# Patient Record
Sex: Male | Born: 1945 | Race: White | Hispanic: No | Marital: Married | Smoking: Former smoker
Health system: Southern US, Community
[De-identification: ages and names within clinical notes are randomized; demographics above are authoritative.]

## PROBLEM LIST (undated history)

## (undated) DIAGNOSIS — G4730 Sleep apnea, unspecified: Secondary | ICD-10-CM

## (undated) DIAGNOSIS — I2510 Atherosclerotic heart disease of native coronary artery without angina pectoris: Secondary | ICD-10-CM

## (undated) DIAGNOSIS — D649 Anemia, unspecified: Secondary | ICD-10-CM

## (undated) DIAGNOSIS — E538 Deficiency of other specified B group vitamins: Secondary | ICD-10-CM

## (undated) DIAGNOSIS — Z5189 Encounter for other specified aftercare: Secondary | ICD-10-CM

## (undated) DIAGNOSIS — K56609 Unspecified intestinal obstruction, unspecified as to partial versus complete obstruction: Secondary | ICD-10-CM

## (undated) DIAGNOSIS — D689 Coagulation defect, unspecified: Secondary | ICD-10-CM

## (undated) DIAGNOSIS — I10 Essential (primary) hypertension: Secondary | ICD-10-CM

## (undated) DIAGNOSIS — M1990 Unspecified osteoarthritis, unspecified site: Secondary | ICD-10-CM

## (undated) DIAGNOSIS — Z9861 Coronary angioplasty status: Principal | ICD-10-CM

## (undated) DIAGNOSIS — K219 Gastro-esophageal reflux disease without esophagitis: Secondary | ICD-10-CM

## (undated) DIAGNOSIS — T7840XA Allergy, unspecified, initial encounter: Secondary | ICD-10-CM

## (undated) DIAGNOSIS — F329 Major depressive disorder, single episode, unspecified: Secondary | ICD-10-CM

## (undated) DIAGNOSIS — F32A Depression, unspecified: Secondary | ICD-10-CM

## (undated) DIAGNOSIS — T07XXXA Unspecified multiple injuries, initial encounter: Secondary | ICD-10-CM

## (undated) DIAGNOSIS — F419 Anxiety disorder, unspecified: Secondary | ICD-10-CM

## (undated) DIAGNOSIS — E1142 Type 2 diabetes mellitus with diabetic polyneuropathy: Secondary | ICD-10-CM

## (undated) HISTORY — DX: Anemia, unspecified: D64.9

## (undated) HISTORY — DX: Allergy, unspecified, initial encounter: T78.40XA

## (undated) HISTORY — DX: Atherosclerotic heart disease of native coronary artery without angina pectoris: I25.10

## (undated) HISTORY — PX: CHOLECYSTECTOMY: SHX55

## (undated) HISTORY — DX: Coagulation defect, unspecified: D68.9

## (undated) HISTORY — PX: INNER EAR SURGERY: SHX679

## (undated) HISTORY — DX: Major depressive disorder, single episode, unspecified: F32.9

## (undated) HISTORY — DX: Encounter for other specified aftercare: Z51.89

## (undated) HISTORY — DX: Unspecified intestinal obstruction, unspecified as to partial versus complete obstruction: K56.609

## (undated) HISTORY — DX: Unspecified multiple injuries, initial encounter: T07.XXXA

## (undated) HISTORY — DX: Essential (primary) hypertension: I10

## (undated) HISTORY — DX: Anxiety disorder, unspecified: F41.9

## (undated) HISTORY — DX: Unspecified osteoarthritis, unspecified site: M19.90

## (undated) HISTORY — DX: Coronary angioplasty status: Z98.61

## (undated) HISTORY — DX: Type 2 diabetes mellitus with diabetic polyneuropathy: E11.42

## (undated) HISTORY — DX: Gastro-esophageal reflux disease without esophagitis: K21.9

## (undated) HISTORY — PX: APPENDECTOMY: SHX54

## (undated) HISTORY — DX: Deficiency of other specified B group vitamins: E53.8

## (undated) HISTORY — DX: Depression, unspecified: F32.A

## (undated) HISTORY — PX: ABDOMINAL MASS RESECTION: SHX1110

---

## 1967-06-22 DIAGNOSIS — Z5309 Procedure and treatment not carried out because of other contraindication: Secondary | ICD-10-CM

## 1967-06-22 HISTORY — DX: Procedure and treatment not carried out because of other contraindication: Z53.09

## 1998-12-26 ENCOUNTER — Ambulatory Visit (HOSPITAL_COMMUNITY): Admission: RE | Admit: 1998-12-26 | Discharge: 1998-12-26 | Payer: Self-pay | Admitting: Gastroenterology

## 2003-05-28 ENCOUNTER — Encounter: Admission: RE | Admit: 2003-05-28 | Discharge: 2003-05-28 | Payer: Self-pay | Admitting: Gastroenterology

## 2006-11-20 DIAGNOSIS — I2510 Atherosclerotic heart disease of native coronary artery without angina pectoris: Secondary | ICD-10-CM

## 2006-11-20 HISTORY — DX: Atherosclerotic heart disease of native coronary artery without angina pectoris: I25.10

## 2007-05-22 DIAGNOSIS — I2510 Atherosclerotic heart disease of native coronary artery without angina pectoris: Secondary | ICD-10-CM | POA: Diagnosis present

## 2007-06-04 ENCOUNTER — Inpatient Hospital Stay (HOSPITAL_COMMUNITY): Admission: EM | Admit: 2007-06-04 | Discharge: 2007-06-06 | Payer: Self-pay | Admitting: Emergency Medicine

## 2007-06-05 HISTORY — PX: PERCUTANEOUS CORONARY STENT INTERVENTION (PCI-S): SHX6016

## 2007-06-05 HISTORY — PX: CARDIAC CATHETERIZATION: SHX172

## 2007-06-29 ENCOUNTER — Encounter (HOSPITAL_COMMUNITY): Admission: RE | Admit: 2007-06-29 | Discharge: 2007-09-27 | Payer: Self-pay | Admitting: Cardiovascular Disease

## 2007-09-13 HISTORY — PX: OTHER SURGICAL HISTORY: SHX169

## 2007-09-28 ENCOUNTER — Encounter (HOSPITAL_COMMUNITY): Admission: RE | Admit: 2007-09-28 | Discharge: 2007-12-27 | Payer: Self-pay | Admitting: Cardiovascular Disease

## 2007-10-19 ENCOUNTER — Ambulatory Visit: Payer: Self-pay | Admitting: Hematology and Oncology

## 2007-12-07 ENCOUNTER — Ambulatory Visit: Payer: Self-pay | Admitting: Hematology and Oncology

## 2007-12-22 ENCOUNTER — Inpatient Hospital Stay (HOSPITAL_COMMUNITY): Admission: EM | Admit: 2007-12-22 | Discharge: 2007-12-23 | Payer: Self-pay | Admitting: Emergency Medicine

## 2007-12-25 HISTORY — PX: NM MYOCAR SINGLE W/SPECT: HXRAD625

## 2008-02-02 ENCOUNTER — Ambulatory Visit: Payer: Self-pay | Admitting: Hematology and Oncology

## 2008-02-06 LAB — BASIC METABOLIC PANEL
BUN: 16 mg/dL (ref 6–23)
Potassium: 4.6 mEq/L (ref 3.5–5.3)
Sodium: 137 mEq/L (ref 135–145)

## 2008-02-06 LAB — CBC WITH DIFFERENTIAL/PLATELET
Eosinophils Absolute: 0.2 10*3/uL (ref 0.0–0.5)
LYMPH%: 22.8 % (ref 14.0–48.0)
MONO#: 0.4 10*3/uL (ref 0.1–0.9)
NEUT#: 3.3 10*3/uL (ref 1.5–6.5)
Platelets: 132 10*3/uL — ABNORMAL LOW (ref 145–400)
RBC: 5.16 10*6/uL (ref 4.20–5.71)
WBC: 5 10*3/uL (ref 4.0–10.0)

## 2008-02-07 LAB — IRON AND TIBC
%SAT: 27 % (ref 20–55)
Iron: 138 ug/dL (ref 42–165)
TIBC: 512 ug/dL — ABNORMAL HIGH (ref 215–435)
UIBC: 374 ug/dL

## 2008-02-07 LAB — FERRITIN: Ferritin: 15 ng/mL — ABNORMAL LOW (ref 22–322)

## 2008-04-24 ENCOUNTER — Ambulatory Visit: Payer: Self-pay | Admitting: Hematology and Oncology

## 2008-04-26 LAB — CBC WITH DIFFERENTIAL/PLATELET
BASO%: 1.2 % (ref 0.0–2.0)
EOS%: 3.6 % (ref 0.0–7.0)
HGB: 13.5 g/dL (ref 13.0–17.1)
MCH: 30.3 pg (ref 28.0–33.4)
MCHC: 34.4 g/dL (ref 32.0–35.9)
MONO%: 7.3 % (ref 0.0–13.0)
RBC: 4.45 10*6/uL (ref 4.20–5.71)
RDW: 14 % (ref 11.2–14.6)
lymph#: 0.7 10*3/uL — ABNORMAL LOW (ref 0.9–3.3)

## 2008-04-26 LAB — COMPREHENSIVE METABOLIC PANEL
ALT: 27 U/L (ref 0–53)
AST: 30 U/L (ref 0–37)
Albumin: 4.4 g/dL (ref 3.5–5.2)
Alkaline Phosphatase: 60 U/L (ref 39–117)
Calcium: 9.2 mg/dL (ref 8.4–10.5)
Chloride: 103 mEq/L (ref 96–112)
Potassium: 4 mEq/L (ref 3.5–5.3)
Sodium: 140 mEq/L (ref 135–145)

## 2008-06-10 ENCOUNTER — Ambulatory Visit: Payer: Self-pay | Admitting: Hematology and Oncology

## 2008-06-10 LAB — IRON AND TIBC
%SAT: 28 % (ref 20–55)
Iron: 101 ug/dL (ref 42–165)
TIBC: 363 ug/dL (ref 215–435)

## 2008-06-10 LAB — FERRITIN: Ferritin: 313 ng/mL (ref 22–322)

## 2008-06-10 LAB — VITAMIN B12: Vitamin B-12: 725 pg/mL (ref 211–911)

## 2008-09-24 ENCOUNTER — Ambulatory Visit: Payer: Self-pay | Admitting: Hematology and Oncology

## 2008-09-26 LAB — FERRITIN: Ferritin: 173 ng/mL (ref 22–322)

## 2008-09-26 LAB — VITAMIN B12: Vitamin B-12: 522 pg/mL (ref 211–911)

## 2008-09-26 LAB — CBC WITH DIFFERENTIAL/PLATELET
Basophils Absolute: 0 10*3/uL (ref 0.0–0.1)
Eosinophils Absolute: 0.2 10*3/uL (ref 0.0–0.5)
HGB: 14.8 g/dL (ref 13.0–17.1)
LYMPH%: 22.1 % (ref 14.0–49.0)
MCH: 31.6 pg (ref 27.2–33.4)
MCV: 90.1 fL (ref 79.3–98.0)
MONO%: 6.9 % (ref 0.0–14.0)
NEUT#: 3.4 10*3/uL (ref 1.5–6.5)
NEUT%: 67.2 % (ref 39.0–75.0)
Platelets: 131 10*3/uL — ABNORMAL LOW (ref 140–400)

## 2008-09-26 LAB — FOLATE: Folate: 17.9 ng/mL

## 2008-10-14 HISTORY — PX: DOPPLER ECHOCARDIOGRAPHY: SHX263

## 2008-12-26 ENCOUNTER — Inpatient Hospital Stay (HOSPITAL_COMMUNITY): Admission: EM | Admit: 2008-12-26 | Discharge: 2008-12-29 | Payer: Self-pay | Admitting: Internal Medicine

## 2009-03-21 HISTORY — PX: NM MYOVIEW LTD: HXRAD82

## 2009-09-03 ENCOUNTER — Ambulatory Visit: Payer: Self-pay | Admitting: Hematology and Oncology

## 2009-09-05 LAB — CBC WITH DIFFERENTIAL/PLATELET
BASO%: 0.4 % (ref 0.0–2.0)
Basophils Absolute: 0 10*3/uL (ref 0.0–0.1)
EOS%: 2.6 % (ref 0.0–7.0)
Eosinophils Absolute: 0.1 10*3/uL (ref 0.0–0.5)
HCT: 42.2 % (ref 38.4–49.9)
HGB: 14.7 g/dL (ref 13.0–17.1)
LYMPH%: 19.8 % (ref 14.0–49.0)
MCH: 31 pg (ref 27.2–33.4)
MCHC: 34.9 g/dL (ref 32.0–36.0)
MCV: 88.8 fL (ref 79.3–98.0)
MONO#: 0.4 10*3/uL (ref 0.1–0.9)
MONO%: 6.7 % (ref 0.0–14.0)
NEUT#: 4 10*3/uL (ref 1.5–6.5)
NEUT%: 70.5 % (ref 39.0–75.0)
Platelets: 141 10*3/uL (ref 140–400)
RBC: 4.75 10*6/uL (ref 4.20–5.82)
RDW: 12.8 % (ref 11.0–14.6)
WBC: 5.6 10*3/uL (ref 4.0–10.3)
lymph#: 1.1 10*3/uL (ref 0.9–3.3)

## 2009-09-05 LAB — COMPREHENSIVE METABOLIC PANEL
ALT: 39 U/L (ref 0–53)
AST: 37 U/L (ref 0–37)
Albumin: 4.5 g/dL (ref 3.5–5.2)
Alkaline Phosphatase: 62 U/L (ref 39–117)
BUN: 24 mg/dL — ABNORMAL HIGH (ref 6–23)
CO2: 28 mEq/L (ref 19–32)
Calcium: 9.3 mg/dL (ref 8.4–10.5)
Chloride: 106 mEq/L (ref 96–112)
Creatinine, Ser: 1.05 mg/dL (ref 0.40–1.50)
Glucose, Bld: 128 mg/dL — ABNORMAL HIGH (ref 70–99)
Potassium: 3.7 mEq/L (ref 3.5–5.3)
Sodium: 140 mEq/L (ref 135–145)
Total Bilirubin: 0.9 mg/dL (ref 0.3–1.2)
Total Protein: 7 g/dL (ref 6.0–8.3)

## 2009-09-05 LAB — LACTATE DEHYDROGENASE: LDH: 81 U/L — ABNORMAL LOW (ref 94–250)

## 2009-09-08 LAB — IRON AND TIBC: UIBC: 296 ug/dL

## 2009-09-08 LAB — VITAMIN B12: Vitamin B-12: 1119 pg/mL — ABNORMAL HIGH (ref 211–911)

## 2009-09-08 LAB — FOLATE RBC: RBC Folate: 831 ng/mL — ABNORMAL HIGH (ref 180–600)

## 2009-11-21 ENCOUNTER — Ambulatory Visit: Payer: Self-pay | Admitting: Hematology and Oncology

## 2009-11-24 LAB — CBC WITH DIFFERENTIAL/PLATELET
BASO%: 0.3 % (ref 0.0–2.0)
Basophils Absolute: 0 10*3/uL (ref 0.0–0.1)
EOS%: 1.9 % (ref 0.0–7.0)
Eosinophils Absolute: 0.1 10*3/uL (ref 0.0–0.5)
HCT: 40.4 % (ref 38.4–49.9)
HGB: 14.2 g/dL (ref 13.0–17.1)
LYMPH%: 22.1 % (ref 14.0–49.0)
MCH: 31.4 pg (ref 27.2–33.4)
MCHC: 35.3 g/dL (ref 32.0–36.0)
MCV: 89 fL (ref 79.3–98.0)
MONO#: 0.3 10*3/uL (ref 0.1–0.9)
MONO%: 5.7 % (ref 0.0–14.0)
NEUT#: 3.6 10*3/uL (ref 1.5–6.5)
NEUT%: 70 % (ref 39.0–75.0)
Platelets: 125 10*3/uL — ABNORMAL LOW (ref 140–400)
RBC: 4.54 10*6/uL (ref 4.20–5.82)
RDW: 13.7 % (ref 11.0–14.6)
WBC: 5.1 10*3/uL (ref 4.0–10.3)
lymph#: 1.1 10*3/uL (ref 0.9–3.3)

## 2009-11-24 LAB — IRON AND TIBC
%SAT: 23 % (ref 20–55)
Iron: 85 ug/dL (ref 42–165)
TIBC: 368 ug/dL (ref 215–435)
UIBC: 283 ug/dL

## 2009-11-24 LAB — VITAMIN B12: Vitamin B-12: 459 pg/mL (ref 211–911)

## 2009-11-24 LAB — COMPREHENSIVE METABOLIC PANEL
ALT: 42 U/L (ref 0–53)
AST: 27 U/L (ref 0–37)
Albumin: 4.8 g/dL (ref 3.5–5.2)
Alkaline Phosphatase: 62 U/L (ref 39–117)
BUN: 14 mg/dL (ref 6–23)
CO2: 25 mEq/L (ref 19–32)
Calcium: 9.3 mg/dL (ref 8.4–10.5)
Chloride: 105 mEq/L (ref 96–112)
Creatinine, Ser: 1.12 mg/dL (ref 0.40–1.50)
Glucose, Bld: 139 mg/dL — ABNORMAL HIGH (ref 70–99)
Potassium: 4 mEq/L (ref 3.5–5.3)
Sodium: 142 mEq/L (ref 135–145)
Total Bilirubin: 0.7 mg/dL (ref 0.3–1.2)
Total Protein: 6.7 g/dL (ref 6.0–8.3)

## 2009-11-24 LAB — FERRITIN: Ferritin: 74 ng/mL (ref 22–322)

## 2009-12-08 ENCOUNTER — Inpatient Hospital Stay (HOSPITAL_COMMUNITY): Admission: EM | Admit: 2009-12-08 | Discharge: 2009-12-08 | Payer: Self-pay | Admitting: Emergency Medicine

## 2009-12-08 HISTORY — PX: CARDIAC CATHETERIZATION: SHX172

## 2010-03-06 ENCOUNTER — Ambulatory Visit: Payer: Self-pay | Admitting: Hematology and Oncology

## 2010-03-06 LAB — CBC WITH DIFFERENTIAL/PLATELET
BASO%: 0.8 % (ref 0.0–2.0)
Basophils Absolute: 0 10*3/uL (ref 0.0–0.1)
EOS%: 3.2 % (ref 0.0–7.0)
Eosinophils Absolute: 0.2 10*3/uL (ref 0.0–0.5)
HCT: 43.8 % (ref 38.4–49.9)
HGB: 14.8 g/dL (ref 13.0–17.1)
LYMPH%: 21.4 % (ref 14.0–49.0)
MCH: 30.4 pg (ref 27.2–33.4)
MCHC: 33.8 g/dL (ref 32.0–36.0)
MCV: 90 fL (ref 79.3–98.0)
MONO#: 0.4 10*3/uL (ref 0.1–0.9)
MONO%: 7.6 % (ref 0.0–14.0)
NEUT#: 3.4 10*3/uL (ref 1.5–6.5)
NEUT%: 67 % (ref 39.0–75.0)
Platelets: 129 10*3/uL — ABNORMAL LOW (ref 140–400)
RBC: 4.87 10*6/uL (ref 4.20–5.82)
RDW: 13.8 % (ref 11.0–14.6)
WBC: 5.1 10*3/uL (ref 4.0–10.3)
lymph#: 1.1 10*3/uL (ref 0.9–3.3)

## 2010-03-06 LAB — IRON AND TIBC
%SAT: 29 % (ref 20–55)
Iron: 122 ug/dL (ref 42–165)
TIBC: 426 ug/dL (ref 215–435)
UIBC: 304 ug/dL

## 2010-03-06 LAB — FERRITIN: Ferritin: 39 ng/mL (ref 22–322)

## 2010-03-06 LAB — COMPREHENSIVE METABOLIC PANEL
ALT: 33 U/L (ref 0–53)
AST: 31 U/L (ref 0–37)
Albumin: 4.6 g/dL (ref 3.5–5.2)
Alkaline Phosphatase: 62 U/L (ref 39–117)
BUN: 13 mg/dL (ref 6–23)
CO2: 28 mEq/L (ref 19–32)
Calcium: 8.9 mg/dL (ref 8.4–10.5)
Chloride: 102 mEq/L (ref 96–112)
Creatinine, Ser: 1.11 mg/dL (ref 0.40–1.50)
Glucose, Bld: 115 mg/dL — ABNORMAL HIGH (ref 70–99)
Potassium: 4.2 mEq/L (ref 3.5–5.3)
Sodium: 141 mEq/L (ref 135–145)
Total Bilirubin: 0.9 mg/dL (ref 0.3–1.2)
Total Protein: 6.7 g/dL (ref 6.0–8.3)

## 2010-03-06 LAB — VITAMIN B12: Vitamin B-12: 669 pg/mL (ref 211–911)

## 2010-04-16 ENCOUNTER — Ambulatory Visit (HOSPITAL_BASED_OUTPATIENT_CLINIC_OR_DEPARTMENT_OTHER): Payer: Federal, State, Local not specified - PPO | Admitting: Hematology and Oncology

## 2010-04-20 LAB — CBC WITH DIFFERENTIAL/PLATELET
BASO%: 0.8 % (ref 0.0–2.0)
Basophils Absolute: 0 10*3/uL (ref 0.0–0.1)
EOS%: 4.8 % (ref 0.0–7.0)
Eosinophils Absolute: 0.2 10*3/uL (ref 0.0–0.5)
HCT: 43.7 % (ref 38.4–49.9)
HGB: 14.8 g/dL (ref 13.0–17.1)
LYMPH%: 17.7 % (ref 14.0–49.0)
MCH: 30.8 pg (ref 27.2–33.4)
MCHC: 33.9 g/dL (ref 32.0–36.0)
MCV: 90.7 fL (ref 79.3–98.0)
MONO#: 0.4 10*3/uL (ref 0.1–0.9)
MONO%: 8.4 % (ref 0.0–14.0)
NEUT#: 3.2 10*3/uL (ref 1.5–6.5)
NEUT%: 68.3 % (ref 39.0–75.0)
Platelets: 148 10*3/uL (ref 140–400)
RBC: 4.81 10*6/uL (ref 4.20–5.82)
RDW: 14.1 % (ref 11.0–14.6)
WBC: 4.8 10*3/uL (ref 4.0–10.3)
lymph#: 0.8 10*3/uL — ABNORMAL LOW (ref 0.9–3.3)

## 2010-04-20 LAB — FERRITIN: Ferritin: 589 ng/mL — ABNORMAL HIGH (ref 22–322)

## 2010-04-20 LAB — BASIC METABOLIC PANEL
BUN: 14 mg/dL (ref 6–23)
CO2: 29 mEq/L (ref 19–32)
Calcium: 9.4 mg/dL (ref 8.4–10.5)
Chloride: 104 mEq/L (ref 96–112)
Creatinine, Ser: 1.03 mg/dL (ref 0.40–1.50)
Glucose, Bld: 141 mg/dL — ABNORMAL HIGH (ref 70–99)
Potassium: 4.3 mEq/L (ref 3.5–5.3)
Sodium: 140 mEq/L (ref 135–145)

## 2010-04-20 LAB — VITAMIN B12: Vitamin B-12: 710 pg/mL (ref 211–911)

## 2010-07-31 ENCOUNTER — Encounter: Payer: Federal, State, Local not specified - PPO | Admitting: Hematology and Oncology

## 2010-07-31 DIAGNOSIS — E538 Deficiency of other specified B group vitamins: Secondary | ICD-10-CM

## 2010-07-31 DIAGNOSIS — D509 Iron deficiency anemia, unspecified: Secondary | ICD-10-CM

## 2010-07-31 LAB — COMPREHENSIVE METABOLIC PANEL
ALT: 37 U/L (ref 0–53)
AST: 30 U/L (ref 0–37)
Albumin: 4.1 g/dL (ref 3.5–5.2)
Alkaline Phosphatase: 52 U/L (ref 39–117)
BUN: 17 mg/dL (ref 6–23)
CO2: 24 mEq/L (ref 19–32)
Calcium: 9 mg/dL (ref 8.4–10.5)
Chloride: 109 mEq/L (ref 96–112)
Creatinine, Ser: 1.4 mg/dL (ref 0.40–1.50)
Glucose, Bld: 144 mg/dL — ABNORMAL HIGH (ref 70–99)
Potassium: 3.8 mEq/L (ref 3.5–5.3)
Sodium: 142 mEq/L (ref 135–145)
Total Bilirubin: 1 mg/dL (ref 0.3–1.2)
Total Protein: 6.4 g/dL (ref 6.0–8.3)

## 2010-07-31 LAB — IRON AND TIBC
%SAT: 33 % (ref 20–55)
Iron: 110 ug/dL (ref 42–165)
TIBC: 335 ug/dL (ref 215–435)
UIBC: 225 ug/dL

## 2010-07-31 LAB — CBC WITH DIFFERENTIAL/PLATELET
BASO%: 0.2 % (ref 0.0–2.0)
Basophils Absolute: 0 10*3/uL (ref 0.0–0.1)
EOS%: 1.5 % (ref 0.0–7.0)
Eosinophils Absolute: 0.1 10*3/uL (ref 0.0–0.5)
HCT: 41.8 % (ref 38.4–49.9)
HGB: 14.3 g/dL (ref 13.0–17.1)
LYMPH%: 16.7 % (ref 14.0–49.0)
MCH: 30.6 pg (ref 27.2–33.4)
MCHC: 34.1 g/dL (ref 32.0–36.0)
MCV: 89.6 fL (ref 79.3–98.0)
MONO#: 0.3 10*3/uL (ref 0.1–0.9)
MONO%: 5.3 % (ref 0.0–14.0)
NEUT#: 3.9 10*3/uL (ref 1.5–6.5)
NEUT%: 76.3 % — ABNORMAL HIGH (ref 39.0–75.0)
Platelets: 130 10*3/uL — ABNORMAL LOW (ref 140–400)
RBC: 4.67 10*6/uL (ref 4.20–5.82)
RDW: 12.8 % (ref 11.0–14.6)
WBC: 5.1 10*3/uL (ref 4.0–10.3)
lymph#: 0.8 10*3/uL — ABNORMAL LOW (ref 0.9–3.3)

## 2010-07-31 LAB — FERRITIN: Ferritin: 258 ng/mL (ref 22–322)

## 2010-07-31 LAB — VITAMIN B12: Vitamin B-12: 823 pg/mL (ref 211–911)

## 2010-08-04 ENCOUNTER — Encounter: Payer: Federal, State, Local not specified - PPO | Admitting: Hematology and Oncology

## 2010-08-05 ENCOUNTER — Encounter (HOSPITAL_BASED_OUTPATIENT_CLINIC_OR_DEPARTMENT_OTHER): Payer: Federal, State, Local not specified - PPO | Admitting: Hematology and Oncology

## 2010-08-05 ENCOUNTER — Other Ambulatory Visit: Payer: Self-pay | Admitting: Hematology and Oncology

## 2010-08-05 DIAGNOSIS — E538 Deficiency of other specified B group vitamins: Secondary | ICD-10-CM

## 2010-08-05 DIAGNOSIS — D509 Iron deficiency anemia, unspecified: Secondary | ICD-10-CM

## 2010-08-05 LAB — LIPID PANEL
Cholesterol: 106 mg/dL (ref 0–200)
HDL: 45 mg/dL (ref >39)
LDL Cholesterol: 46 mg/dL (ref 0–99)
Total CHOL/HDL Ratio: 2.4 Ratio
Triglycerides: 75 mg/dL (ref <150)
VLDL: 15 mg/dL (ref 0–40)

## 2010-08-05 LAB — GLUCOSE, RANDOM: Glucose, Bld: 155 mg/dL — ABNORMAL HIGH (ref 70–99)

## 2010-09-06 LAB — POCT I-STAT, CHEM 8
BUN: 20 mg/dL (ref 6–23)
Calcium, Ion: 1.17 mmol/L (ref 1.12–1.32)
Chloride: 105 mEq/L (ref 96–112)
Glucose, Bld: 150 mg/dL — ABNORMAL HIGH (ref 70–99)
HCT: 35 % — ABNORMAL LOW (ref 39.0–52.0)
TCO2: 25 mmol/L (ref 0–100)

## 2010-09-06 LAB — TROPONIN I: Troponin I: 0.01 ng/mL (ref 0.00–0.06)

## 2010-09-06 LAB — COMPREHENSIVE METABOLIC PANEL
ALT: 48 U/L (ref 0–53)
Alkaline Phosphatase: 71 U/L (ref 39–117)
BUN: 18 mg/dL (ref 6–23)
Chloride: 106 mEq/L (ref 96–112)
Glucose, Bld: 147 mg/dL — ABNORMAL HIGH (ref 70–99)
Potassium: 3.8 mEq/L (ref 3.5–5.1)
Sodium: 137 mEq/L (ref 135–145)
Total Bilirubin: 0.6 mg/dL (ref 0.3–1.2)

## 2010-09-06 LAB — POCT CARDIAC MARKERS: Troponin i, poc: <0.05 ng/mL (ref 0.00–0.09)

## 2010-09-06 LAB — CBC
HCT: 35.9 % — ABNORMAL LOW (ref 39.0–52.0)
Hemoglobin: 12.5 g/dL — ABNORMAL LOW (ref 13.0–17.0)
MCHC: 34.8 g/dL (ref 30.0–36.0)
MCV: 90.8 fL (ref 78.0–100.0)
RBC: 3.95 MIL/uL — ABNORMAL LOW (ref 4.22–5.81)

## 2010-09-06 LAB — DIFFERENTIAL
Basophils Relative: 1 % (ref 0–1)
Eosinophils Absolute: 0.2 10*3/uL (ref 0.0–0.7)
Eosinophils Relative: 3 % (ref 0–5)
Monocytes Absolute: 0.5 10*3/uL (ref 0.1–1.0)
Monocytes Relative: 8 % (ref 3–12)

## 2010-09-06 LAB — HEMOGLOBIN A1C
Hgb A1c MFr Bld: 6.6 % — ABNORMAL HIGH (ref <5.7)
Mean Plasma Glucose: 143 mg/dL — ABNORMAL HIGH (ref <117)

## 2010-09-06 LAB — MRSA PCR SCREENING: MRSA by PCR: NEGATIVE

## 2010-09-27 LAB — CBC
HCT: 33.7 % — ABNORMAL LOW (ref 39.0–52.0)
Hemoglobin: 11.8 g/dL — ABNORMAL LOW (ref 13.0–17.0)
Hemoglobin: 15.8 g/dL (ref 13.0–17.0)
MCHC: 35.2 g/dL (ref 30.0–36.0)
MCV: 89.8 fL (ref 78.0–100.0)
Platelets: 111 10*3/uL — ABNORMAL LOW (ref 150–400)
RBC: 3.75 MIL/uL — ABNORMAL LOW (ref 4.22–5.81)
RBC: 4.74 MIL/uL (ref 4.22–5.81)
RBC: 5.05 MIL/uL (ref 4.22–5.81)
WBC: 3.8 10*3/uL — ABNORMAL LOW (ref 4.0–10.5)
WBC: 7.7 10*3/uL (ref 4.0–10.5)

## 2010-09-27 LAB — COMPREHENSIVE METABOLIC PANEL
ALT: 24 U/L (ref 0–53)
Albumin: 4.2 g/dL (ref 3.5–5.2)
Alkaline Phosphatase: 69 U/L (ref 39–117)
Alkaline Phosphatase: 74 U/L (ref 39–117)
CO2: 25 mEq/L (ref 19–32)
CO2: 26 mEq/L (ref 19–32)
Calcium: 9.2 mg/dL (ref 8.4–10.5)
GFR calc Af Amer: >60 mL/min (ref >60)
GFR calc non Af Amer: >60 mL/min (ref >60)
Glucose, Bld: 145 mg/dL — ABNORMAL HIGH (ref 70–99)
Potassium: 3.9 mEq/L (ref 3.5–5.1)
Sodium: 135 mEq/L (ref 135–145)
Sodium: 137 mEq/L (ref 135–145)
Total Bilirubin: 1.3 mg/dL — ABNORMAL HIGH (ref 0.3–1.2)
Total Bilirubin: 1.8 mg/dL — ABNORMAL HIGH (ref 0.3–1.2)
Total Protein: 6.7 g/dL (ref 6.0–8.3)

## 2010-09-27 LAB — DIFFERENTIAL
Basophils Absolute: 0 10*3/uL (ref 0.0–0.1)
Basophils Relative: 0 % (ref 0–1)
Lymphocytes Relative: 11 % — ABNORMAL LOW (ref 12–46)
Neutro Abs: 6.1 10*3/uL (ref 1.7–7.7)
Neutrophils Relative %: 79 % — ABNORMAL HIGH (ref 43–77)

## 2010-09-27 LAB — LIPID PANEL
Cholesterol: 80 mg/dL (ref 0–200)
HDL: 39 mg/dL — ABNORMAL LOW (ref >39)
Total CHOL/HDL Ratio: 2.1 RATIO

## 2010-09-27 LAB — TYPE AND SCREEN: ABO/RH(D): O NEG

## 2010-09-27 LAB — CARDIAC PANEL(CRET KIN+CKTOT+MB+TROPI)
CK, MB: 10.2 ng/mL — ABNORMAL HIGH (ref 0.3–4.0)
Relative Index: 7.8 — ABNORMAL HIGH (ref 0.0–2.5)
Troponin I: 0.01 ng/mL (ref 0.00–0.06)

## 2010-09-27 LAB — BASIC METABOLIC PANEL
BUN: 7 mg/dL (ref 6–23)
Chloride: 107 mEq/L (ref 96–112)
GFR calc non Af Amer: >60 mL/min (ref >60)
Potassium: 3.5 mEq/L (ref 3.5–5.1)
Sodium: 137 mEq/L (ref 135–145)

## 2010-09-27 LAB — HEMOGLOBIN A1C
Hgb A1c MFr Bld: 6.1 % (ref 4.6–6.1)
Mean Plasma Glucose: 128 mg/dL

## 2010-09-27 LAB — PROTIME-INR: Prothrombin Time: 12.7 seconds (ref 11.6–15.2)

## 2010-09-27 LAB — URINALYSIS, MICROSCOPIC ONLY
Bilirubin Urine: NEGATIVE
Glucose, UA: NEGATIVE mg/dL
Hgb urine dipstick: NEGATIVE
Ketones, ur: NEGATIVE mg/dL
Protein, ur: NEGATIVE mg/dL

## 2010-09-27 LAB — URINE CULTURE
Colony Count: NO GROWTH
Culture: NO GROWTH
Special Requests: NEGATIVE

## 2010-09-27 LAB — CULTURE, BLOOD (ROUTINE X 2): Culture: NO GROWTH

## 2010-09-27 LAB — TSH: TSH: 1.328 u[IU]/mL (ref 0.350–4.500)

## 2010-10-08 ENCOUNTER — Encounter (HOSPITAL_BASED_OUTPATIENT_CLINIC_OR_DEPARTMENT_OTHER)
Admission: RE | Admit: 2010-10-08 | Discharge: 2010-10-08 | Disposition: A | Discharge status: Home / Self Care | Payer: Federal, State, Local not specified - PPO | Source: Ambulatory Visit | Attending: Surgery | Admitting: Surgery

## 2010-10-08 ENCOUNTER — Other Ambulatory Visit: Payer: Self-pay | Admitting: General Surgery

## 2010-10-08 LAB — CBC
HCT: 41.1 % (ref 39.0–52.0)
Hemoglobin: 14.5 g/dL (ref 13.0–17.0)
MCH: 30.5 pg (ref 26.0–34.0)
MCHC: 35.3 g/dL (ref 30.0–36.0)
MCV: 86.5 fL (ref 78.0–100.0)
RDW: 12.5 % (ref 11.5–15.5)

## 2010-10-08 LAB — BASIC METABOLIC PANEL
BUN: 14 mg/dL (ref 6–23)
CO2: 27 mEq/L (ref 19–32)
Calcium: 8.9 mg/dL (ref 8.4–10.5)
Creatinine, Ser: 1.06 mg/dL (ref 0.4–1.5)
GFR calc non Af Amer: >60 mL/min (ref >60)
Glucose, Bld: 178 mg/dL — ABNORMAL HIGH (ref 70–99)
Sodium: 138 mEq/L (ref 135–145)

## 2010-10-09 ENCOUNTER — Other Ambulatory Visit: Payer: Self-pay | Admitting: Surgery

## 2010-10-09 ENCOUNTER — Ambulatory Visit (HOSPITAL_BASED_OUTPATIENT_CLINIC_OR_DEPARTMENT_OTHER)
Admission: RE | Admit: 2010-10-09 | Discharge: 2010-10-09 | Disposition: A | Discharge status: Home / Self Care | Payer: Federal, State, Local not specified - PPO | Source: Ambulatory Visit | Attending: Surgery | Admitting: Surgery

## 2010-10-09 DIAGNOSIS — F329 Major depressive disorder, single episode, unspecified: Secondary | ICD-10-CM | POA: Insufficient documentation

## 2010-10-09 DIAGNOSIS — Z01812 Encounter for preprocedural laboratory examination: Secondary | ICD-10-CM | POA: Insufficient documentation

## 2010-10-09 DIAGNOSIS — D1739 Benign lipomatous neoplasm of skin and subcutaneous tissue of other sites: Secondary | ICD-10-CM | POA: Insufficient documentation

## 2010-10-09 DIAGNOSIS — K219 Gastro-esophageal reflux disease without esophagitis: Secondary | ICD-10-CM | POA: Insufficient documentation

## 2010-10-09 DIAGNOSIS — F3289 Other specified depressive episodes: Secondary | ICD-10-CM | POA: Insufficient documentation

## 2010-10-09 DIAGNOSIS — F411 Generalized anxiety disorder: Secondary | ICD-10-CM | POA: Insufficient documentation

## 2010-10-09 DIAGNOSIS — M129 Arthropathy, unspecified: Secondary | ICD-10-CM | POA: Insufficient documentation

## 2010-10-14 NOTE — Op Note (Signed)
  NAME:  Jacob Dalton, Jacob Dalton NO.:  [id number]  MEDICAL RECORD NO.:  [id number]            PATIENT TYPE:  LOCATION:                                 FACILITY:  PHYSICIAN:  Currie Paris, M.D.   DATE OF BIRTH:  DATE OF PROCEDURE: DATE OF DISCHARGE:                              OPERATIVE REPORT   PREOPERATIVE DIAGNOSIS:  Mass, right upper back, clinically lipoma.  POSTOPERATIVE DIAGNOSIS:  Mass, right upper back, clinically lipoma.  PROCEDURE:  Excision of mass, right upper back.  SURGEON:  Currie Paris, MD  ANESTHESIA:  General.  CLINICAL HISTORY:  This is a 65 year old gentleman with a soft well- circumscribed mass in the upper right back above the scapula. Clinically this appeared to be a lipoma.  He wished to have this excised because it is gradually enlarging and somewhat tender.  It measured approximately 7 cm.  DESCRIPTION OF PROCEDURE:  I saw the patient in the holding area and we confirmed the plans for the procedure as noted above.  I marked the mass on his back.  The patient was taken to the operating room and after satisfactory general endotracheal anesthesia had been obtained, was placed in the prone position in the operating room table with appropriate padding.  The area around the lipoma was prepped and draped and the time-out was done.  I infiltrated some 0.25% plain Marcaine and subcutaneous tissues.  I made a transverse incision directly over the midportion of the mass.  I divided little bit subcutaneous tissue.  I was unable to excise the mass with the cautery.  This came out intact and in toto.  The area was carefully checked for hemostasis and everything appeared to be dry.  I put some additional 0.25% plain Marcaine into the deeper tissues and in an attempt to reduce his postoperative pain.  The wound was irrigated and the last check made for hemostasis and again everything appeared dry.  I tacked the skin flaps down to the  muscle with interrupted 3-0 Vicryl and then closed with skin with 4-0 Monocryl subcuticular and Dermabond.  The patient tolerated the procedure well and there were no complications.  All counts were correct.     Currie Paris, M.D.     CJS/MEDQ  D:  10/09/2010  T:  10/09/2010  Job:  [id number]  cc:   Candyce Churn, M.D.  Electronically Signed by Cyndia Bent M.D. on 10/14/2010 09:30:24 AM
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
## 2010-11-03 NOTE — Discharge Summary (Signed)
NAME:  Jacob Dalton, Jacob Dalton NO.:  [id number]   MEDICAL RECORD NO.:  [id number]          PATIENT TYPE:  INP   LOCATION:  6523                         FACILITY:  [facility]   PHYSICIAN:  Richard A. Alanda Amass, M.D.DATE OF BIRTH:  10-16-1945   DATE OF ADMISSION:  06/03/2007  DATE OF DISCHARGE:  06/06/2007                               DISCHARGE SUMMARY   DISCHARGE DIAGNOSES:  1. Unstable angina, catheterization and subsequent elective Cypher      stenting to the LAD.  2. Preserved LV function.  3. Family history of coronary disease.  4. Mild dyslipidemia.  5. Post-traumatic stress disorder.  6. Gastroesophageal reflux.   HOSPITAL COURSE:  The patient is a 65 year old male who has seen Dr.  Alanda Amass in the past.  He has no prior history of coronary disease.  He  apparently has had negative stress test in the past but has not seen Dr.  Wandra Arthurs for about 5 years.  He presented June 03, 2007, with  midsternal chest pain consistent with unstable angina.  He was admitted  to telemetry and was started on heparin and nitrates, beta blocker,  aspirin, Protonix and a Statin.  He was cathed electively on June 05, 2007 by Dr. Jenne Campus.  This revealed a 20% RCA, normal left main,  normal circumflex, normal OM and a 99% mid-LAD after the takeoff of the  first diagonal.  He underwent LAD, PCI and Cypher stenting with good  results.  We feel he can be discharged June 06, 2007.  He will  follow up with Dr. Alanda Amass in a couple weeks.   DISCHARGE MEDICATIONS:  1. Simvastatin 20 mg a day.  2. Metoprolol 25 mg b.i.d.  3. Coated aspirin 81 mg a day.  4. Plavix 75 mg a day.  5. Lisinopril 2.5 mg a day.  6. Nitroglycerin sublingual p.r.n.  7. Prilosec OTC 20 mg a day.  8. Lexapro 20 mg a day.   LABS:  White count 5.6, hemoglobin 11.2, hematocrit 33.3, platelets 139.  Sodium 139, potassium 3.9, BUN 8, creatinine 1.1.  Liver functions were  normal.  CK-MB and troponins were  negative x3.  Lipids revealed a  cholesterol 140, LDL 79, HDL 40, TSH 2.49.   Chest X-Ray:  No acute disease.  INR is 1.0.  EKG showed sinus rhythm  with nonspecific ST changes with biphasic T-wave in V2, T-wave inversion  in V3.   DISPOSITION:  The patient was discharged stable condition and will  follow-up with Dr. Alanda Amass in a couple weeks in the office.      Abelino Derrick, P.A.      Richard A. Alanda Amass, M.D.  Electronically Signed    LKK/MEDQ  D:  06/06/2007  T:  06/06/2007  Job:  [id number]   cc:   Gerlene Burdock A. Alanda Amass, M.D.  Tasia Catchings, M.D.
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
## 2010-11-26 ENCOUNTER — Encounter (INDEPENDENT_AMBULATORY_CARE_PROVIDER_SITE_OTHER): Payer: Self-pay | Admitting: Surgery

## 2010-12-01 ENCOUNTER — Other Ambulatory Visit: Payer: Self-pay | Admitting: Hematology and Oncology

## 2010-12-01 ENCOUNTER — Encounter (HOSPITAL_BASED_OUTPATIENT_CLINIC_OR_DEPARTMENT_OTHER): Payer: Federal, State, Local not specified - PPO | Admitting: Hematology and Oncology

## 2010-12-01 DIAGNOSIS — D649 Anemia, unspecified: Secondary | ICD-10-CM

## 2010-12-01 DIAGNOSIS — E538 Deficiency of other specified B group vitamins: Secondary | ICD-10-CM

## 2010-12-01 LAB — CBC WITH DIFFERENTIAL/PLATELET
Basophils Absolute: 0 10*3/uL (ref 0.0–0.1)
EOS%: 1.8 % (ref 0.0–7.0)
Eosinophils Absolute: 0.1 10*3/uL (ref 0.0–0.5)
LYMPH%: 16.8 % (ref 14.0–49.0)
MCH: 31 pg (ref 27.2–33.4)
MCV: 89.1 fL (ref 79.3–98.0)
MONO%: 7 % (ref 0.0–14.0)
NEUT#: 4.4 10*3/uL (ref 1.5–6.5)
Platelets: 132 10*3/uL — ABNORMAL LOW (ref 140–400)
RBC: 4.78 10*6/uL (ref 4.20–5.82)
RDW: 13.4 % (ref 11.0–14.6)

## 2010-12-01 LAB — IRON AND TIBC
Iron: 94 ug/dL (ref 42–165)
UIBC: 271 ug/dL

## 2010-12-01 LAB — BASIC METABOLIC PANEL
BUN: 17 mg/dL (ref 6–23)
Calcium: 9.8 mg/dL (ref 8.4–10.5)
Glucose, Bld: 130 mg/dL — ABNORMAL HIGH (ref 70–99)
Sodium: 136 mEq/L (ref 135–145)

## 2010-12-04 ENCOUNTER — Encounter (HOSPITAL_BASED_OUTPATIENT_CLINIC_OR_DEPARTMENT_OTHER): Payer: Federal, State, Local not specified - PPO | Admitting: Hematology and Oncology

## 2010-12-04 DIAGNOSIS — D518 Other vitamin B12 deficiency anemias: Secondary | ICD-10-CM

## 2010-12-04 DIAGNOSIS — D509 Iron deficiency anemia, unspecified: Secondary | ICD-10-CM

## 2011-03-18 LAB — DIFFERENTIAL
Basophils Absolute: 0
Lymphocytes Relative: 23
Monocytes Relative: 8

## 2011-03-18 LAB — BASIC METABOLIC PANEL
BUN: 23
CO2: 23
Chloride: 105
Creatinine, Ser: 1.14
Glucose, Bld: 110 — ABNORMAL HIGH

## 2011-03-18 LAB — CBC
Hemoglobin: 12.1 — ABNORMAL LOW
MCHC: 33.1
MCV: 77 — ABNORMAL LOW
Platelets: 126 — ABNORMAL LOW
Platelets: 143 — ABNORMAL LOW
RDW: 22.5 — ABNORMAL HIGH
RDW: 23.5 — ABNORMAL HIGH
WBC: 4.3
WBC: 4.7

## 2011-03-18 LAB — CK TOTAL AND CKMB (NOT AT ARMC): Total CK: 187

## 2011-03-18 LAB — COMPREHENSIVE METABOLIC PANEL
ALT: 28
AST: 29
CO2: 26
Calcium: 8.4
Chloride: 105
GFR calc Af Amer: >60
GFR calc non Af Amer: >60
Sodium: 139
Total Bilirubin: 0.8

## 2011-03-18 LAB — POCT CARDIAC MARKERS
CKMB, poc: 2.1
Myoglobin, poc: 88.6
Operator id: 288831
Operator id: 288831
Troponin i, poc: <0.05

## 2011-03-18 LAB — LIPID PANEL
LDL Cholesterol: 37
Triglycerides: 95
VLDL: 19

## 2011-03-18 LAB — POCT I-STAT, CHEM 8
BUN: 16
Chloride: 106
Sodium: 138

## 2011-03-18 LAB — APTT: aPTT: 29

## 2011-03-26 LAB — CBC
HCT: 33.3 — ABNORMAL LOW
HCT: 34.7 — ABNORMAL LOW
HCT: 35.3 — ABNORMAL LOW
Hemoglobin: 11.2 — ABNORMAL LOW
Hemoglobin: 11.4 — ABNORMAL LOW
MCHC: 32.7
MCHC: 33.7
MCV: 74.7 — ABNORMAL LOW
MCV: 75.7 — ABNORMAL LOW
MCV: 75.8 — ABNORMAL LOW
Platelets: 139 — ABNORMAL LOW
Platelets: 145 — ABNORMAL LOW
RBC: 4.45
RBC: 4.57
RBC: 4.66
RDW: 17.1 — ABNORMAL HIGH
RDW: 17.3 — ABNORMAL HIGH
WBC: 4.9
WBC: 5.2
WBC: 5.6

## 2011-03-26 LAB — TROPONIN I: Troponin I: 0.04

## 2011-03-26 LAB — BASIC METABOLIC PANEL
BUN: 8
CO2: 29
Chloride: 105
Creatinine, Ser: 1.09
Potassium: 3.9

## 2011-03-26 LAB — CK TOTAL AND CKMB (NOT AT ARMC)
CK, MB: 1.8
Relative Index: INVALID
Total CK: 83

## 2011-03-29 LAB — BASIC METABOLIC PANEL
BUN: 9
Calcium: 8.9
Glucose, Bld: 130 — ABNORMAL HIGH
Potassium: 3.9

## 2011-03-29 LAB — MAGNESIUM: Magnesium: 2.3

## 2011-03-29 LAB — I-STAT 8, (EC8 V) (CONVERTED LAB)
Bicarbonate: 24.9 — ABNORMAL HIGH
Glucose, Bld: 132 — ABNORMAL HIGH
HCT: 38 — ABNORMAL LOW
Hemoglobin: 12.9 — ABNORMAL LOW
Operator id: 294511
Potassium: 4.6
Sodium: 139
TCO2: 26

## 2011-03-29 LAB — CBC
HCT: 35 — ABNORMAL LOW
MCHC: 32.7
MCHC: 33.3
MCV: 74.5 — ABNORMAL LOW
Platelets: 164
RBC: 4.56
RBC: 4.7
WBC: 4.4

## 2011-03-29 LAB — TSH: TSH: 2.498

## 2011-03-29 LAB — LIPID PANEL
Cholesterol: 140
HDL: 40
LDL Cholesterol: 79
Total CHOL/HDL Ratio: 3.5
Triglycerides: 103
VLDL: 21

## 2011-03-29 LAB — CARDIAC PANEL(CRET KIN+CKTOT+MB+TROPI)
CK, MB: 2.9
CK, MB: 3.2
Total CK: 130

## 2011-03-29 LAB — CK TOTAL AND CKMB (NOT AT ARMC)
CK, MB: 3.6
Relative Index: 2.3

## 2011-03-29 LAB — POCT CARDIAC MARKERS
CKMB, poc: 2.4
Myoglobin, poc: 76.3
Operator id: 294511

## 2011-03-29 LAB — COMPREHENSIVE METABOLIC PANEL
AST: 31
BUN: 10
CO2: 25
Calcium: 8.5
Creatinine, Ser: 1.25
GFR calc Af Amer: >60
GFR calc non Af Amer: 58 — ABNORMAL LOW
Total Bilirubin: 0.6

## 2011-03-29 LAB — APTT: aPTT: 27

## 2011-03-29 LAB — PROTIME-INR
INR: 1
Prothrombin Time: 13.4

## 2011-03-29 LAB — POCT I-STAT CREATININE: Operator id: 294511

## 2011-03-29 LAB — HEPARIN LEVEL (UNFRACTIONATED): Heparin Unfractionated: 0.74 — ABNORMAL HIGH

## 2011-04-13 ENCOUNTER — Other Ambulatory Visit: Payer: Self-pay | Admitting: Hematology and Oncology

## 2011-04-13 ENCOUNTER — Encounter (HOSPITAL_BASED_OUTPATIENT_CLINIC_OR_DEPARTMENT_OTHER): Payer: Federal, State, Local not specified - PPO | Admitting: Hematology and Oncology

## 2011-04-13 DIAGNOSIS — E538 Deficiency of other specified B group vitamins: Secondary | ICD-10-CM

## 2011-04-13 DIAGNOSIS — D509 Iron deficiency anemia, unspecified: Secondary | ICD-10-CM

## 2011-04-13 LAB — CBC WITH DIFFERENTIAL/PLATELET
Basophils Absolute: 0 10*3/uL (ref 0.0–0.1)
EOS%: 3.1 % (ref 0.0–7.0)
HGB: 14.8 g/dL (ref 13.0–17.1)
LYMPH%: 22.5 % (ref 14.0–49.0)
MCH: 32 pg (ref 27.2–33.4)
MCV: 93.3 fL (ref 79.3–98.0)
MONO%: 7.5 % (ref 0.0–14.0)
NEUT%: 66.7 % (ref 39.0–75.0)
Platelets: 140 10*3/uL (ref 140–400)
RDW: 13.3 % (ref 11.0–14.6)

## 2011-04-13 LAB — BASIC METABOLIC PANEL
BUN: 12 mg/dL (ref 6–23)
Calcium: 9.5 mg/dL (ref 8.4–10.5)
Creatinine, Ser: 0.96 mg/dL (ref 0.50–1.35)
Potassium: 3.9 mEq/L (ref 3.5–5.3)

## 2011-04-13 LAB — IRON AND TIBC
Iron: 96 ug/dL (ref 42–165)
UIBC: 331 ug/dL (ref 125–400)

## 2011-04-16 ENCOUNTER — Encounter (HOSPITAL_BASED_OUTPATIENT_CLINIC_OR_DEPARTMENT_OTHER): Payer: Federal, State, Local not specified - PPO | Admitting: Hematology and Oncology

## 2011-04-16 DIAGNOSIS — D518 Other vitamin B12 deficiency anemias: Secondary | ICD-10-CM

## 2011-04-16 DIAGNOSIS — D509 Iron deficiency anemia, unspecified: Secondary | ICD-10-CM

## 2011-08-16 ENCOUNTER — Other Ambulatory Visit (HOSPITAL_BASED_OUTPATIENT_CLINIC_OR_DEPARTMENT_OTHER): Payer: Federal, State, Local not specified - PPO | Admitting: Lab

## 2011-08-16 DIAGNOSIS — D518 Other vitamin B12 deficiency anemias: Secondary | ICD-10-CM

## 2011-08-16 DIAGNOSIS — D509 Iron deficiency anemia, unspecified: Secondary | ICD-10-CM

## 2011-08-16 LAB — CBC WITH DIFFERENTIAL/PLATELET
Basophils Absolute: 0 10*3/uL (ref 0.0–0.1)
EOS%: 1.9 % (ref 0.0–7.0)
Eosinophils Absolute: 0.1 10*3/uL (ref 0.0–0.5)
HCT: 43.3 % (ref 38.4–49.9)
HGB: 14.6 g/dL (ref 13.0–17.1)
MCH: 30.5 pg (ref 27.2–33.4)
MCV: 90.4 fL (ref 79.3–98.0)
MONO%: 8.7 % (ref 0.0–14.0)
NEUT#: 3.4 10*3/uL (ref 1.5–6.5)
NEUT%: 66.8 % (ref 39.0–75.0)
Platelets: 145 10*3/uL (ref 140–400)

## 2011-08-16 LAB — IRON AND TIBC
%SAT: 27 % (ref 20–55)
TIBC: 404 ug/dL (ref 215–435)

## 2011-08-20 ENCOUNTER — Telehealth: Payer: Self-pay | Admitting: Hematology and Oncology

## 2011-08-20 ENCOUNTER — Encounter (HOSPITAL_COMMUNITY): Payer: Self-pay

## 2011-08-20 ENCOUNTER — Other Ambulatory Visit: Payer: Self-pay

## 2011-08-20 ENCOUNTER — Emergency Department (HOSPITAL_COMMUNITY): Payer: Federal, State, Local not specified - PPO

## 2011-08-20 ENCOUNTER — Ambulatory Visit (HOSPITAL_BASED_OUTPATIENT_CLINIC_OR_DEPARTMENT_OTHER): Payer: Federal, State, Local not specified - PPO | Admitting: Hematology and Oncology

## 2011-08-20 ENCOUNTER — Emergency Department (HOSPITAL_COMMUNITY)
Admission: EM | Admit: 2011-08-20 | Discharge: 2011-08-20 | Disposition: A | Discharge status: Home / Self Care | Payer: Federal, State, Local not specified - PPO | Attending: Emergency Medicine | Admitting: Emergency Medicine

## 2011-08-20 VITALS — BP 118/78 | HR 69 | Temp 96.8°F | Ht 69.0 in | Wt 159.6 lb

## 2011-08-20 DIAGNOSIS — Z7982 Long term (current) use of aspirin: Secondary | ICD-10-CM | POA: Insufficient documentation

## 2011-08-20 DIAGNOSIS — E538 Deficiency of other specified B group vitamins: Secondary | ICD-10-CM

## 2011-08-20 DIAGNOSIS — I2510 Atherosclerotic heart disease of native coronary artery without angina pectoris: Secondary | ICD-10-CM | POA: Insufficient documentation

## 2011-08-20 DIAGNOSIS — D649 Anemia, unspecified: Secondary | ICD-10-CM

## 2011-08-20 DIAGNOSIS — I10 Essential (primary) hypertension: Secondary | ICD-10-CM | POA: Insufficient documentation

## 2011-08-20 DIAGNOSIS — D539 Nutritional anemia, unspecified: Secondary | ICD-10-CM

## 2011-08-20 DIAGNOSIS — Z9861 Coronary angioplasty status: Secondary | ICD-10-CM | POA: Diagnosis present

## 2011-08-20 DIAGNOSIS — K219 Gastro-esophageal reflux disease without esophagitis: Secondary | ICD-10-CM | POA: Diagnosis present

## 2011-08-20 DIAGNOSIS — M549 Dorsalgia, unspecified: Secondary | ICD-10-CM | POA: Insufficient documentation

## 2011-08-20 DIAGNOSIS — R079 Chest pain, unspecified: Secondary | ICD-10-CM

## 2011-08-20 DIAGNOSIS — E785 Hyperlipidemia, unspecified: Secondary | ICD-10-CM | POA: Diagnosis present

## 2011-08-20 DIAGNOSIS — M79609 Pain in unspecified limb: Secondary | ICD-10-CM | POA: Insufficient documentation

## 2011-08-20 DIAGNOSIS — Z79899 Other long term (current) drug therapy: Secondary | ICD-10-CM | POA: Insufficient documentation

## 2011-08-20 LAB — TROPONIN I: Troponin I: <0.3 ng/mL (ref <0.30)

## 2011-08-20 LAB — POCT I-STAT, CHEM 8
BUN: 12 mg/dL (ref 6–23)
Chloride: 103 mEq/L (ref 96–112)
Creatinine, Ser: 1.1 mg/dL (ref 0.50–1.35)
Glucose, Bld: 126 mg/dL — ABNORMAL HIGH (ref 70–99)
Hemoglobin: 13.6 g/dL (ref 13.0–17.0)
Potassium: 4 mEq/L (ref 3.5–5.1)
Sodium: 139 mEq/L (ref 135–145)

## 2011-08-20 LAB — POCT I-STAT TROPONIN I

## 2011-08-20 NOTE — Progress Notes (Signed)
CC:   Richard A. Alanda Amass, M.D. Tasia Catchings, M.D. James L. Malon Kindle., M.D.  IDENTIFYING STATEMENT:  The patient is a 66 year old man with iron- deficiency anemia and B12 deficiency anemia who presents for followup.  INTERVAL HISTORY:  [name] reports chest pain for a few hours now. The pain is located in the right upper chest.  He feels it is reflux. He has a history of coronary artery disease.  He is on Plavix.  We reviewed his recent anemia workup, which is noted below.  The patient is not short of breath.  He is not lightheaded.  MEDICATIONS:  Reviewed and updated.  PAST MEDICAL HISTORY:  Unchanged.  FAMILY HISTORY:  Unchanged.  SOCIAL HISTORY:  Unchanged.  REVIEW OF SYSTEMS:  Cardiovascular:  Chest pain as above.  He denies PND, orthopnea, or ankle swelling.  Respiration:  He denies cough, hemoptysis, wheeze, or shortness of breath.  GI:  Post reflux.  He denies constipation or diarrhea.  The rest of the review of systems is negative.  PHYSICAL EXAMINATION:  General Appearance:  Alert and oriented x3. Vital Signs:  Pulse 69.  Blood pressure 118/78.  Temperature 96.8. Respirations 20.  Weight 159 pounds.  HEENT:  Sclerae are anicteric. Mouth is moist.  Chest:  Clear.  Abdomen:  Soft.  Bowel sounds present. Extremities:  No calf tenderness.  Pulses present and symmetrical.  LABORATORY DATA:  On 08/16/2011, white cell count 5, hemoglobin 14.6, hematocrit 43.3, platelets 145.  Ferritin 84 (136), B12 656 (719), iron 111, TIBC 404, saturation 27%.  IMPRESSION AND PLAN:  Mr. 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  His ferritin store is less than 100, so he will proceed with intravenous iron in the form of Feraheme.  We will schedule This later on next week.  However, he needs to proceed to the emergency room to be evaluated for the chest pain.  He follows up in 4-6  months' time.    ______________________________ Laurice Record, M.D. LIO/MEDQ  D:  08/20/2011  T:  08/20/2011  Job:  [id number]

## 2011-08-20 NOTE — Telephone Encounter (Signed)
[facility] for pt re appts for 3/8 and 9/18 and 9/20. Schedule mailed.

## 2011-08-20 NOTE — Progress Notes (Signed)
This office note has been dictated.

## 2011-08-20 NOTE — ED Notes (Signed)
Pt here with c/o chest pain radiating to left arm, pt was at [facility] for md visit, and on his way to visit he began experiencing chest pain.

## 2011-08-20 NOTE — Patient Instructions (Signed)
Patient to follow up as instructed. Patient to proceed to emergency room to be evaluated for chest pain.

## 2011-08-20 NOTE — ED Notes (Signed)
Dr. Herbie Baltimore at bedside evaluating patient.
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
## 2011-08-20 NOTE — Discharge Instructions (Signed)
Please follow up with Dr. Alanda Amass as Dr. Herbie Baltimore has instructed. If you are having worsening pain, shortness of breath, nausea/vomiting, or with any other worrisome symptoms, please return to the ER.  Chest Pain (Nonspecific) It is often hard to give a specific diagnosis for the cause of chest pain. There is always a chance that your pain could be related to something serious, such as a heart attack or a blood clot in the lungs. You need to follow up with your caregiver for further evaluation. CAUSES   Heartburn.   Pneumonia or bronchitis.   Anxiety and stress.   Inflammation around your heart (pericarditis) or lung (pleuritis or pleurisy).   A blood clot in the lung.   A collapsed lung (pneumothorax). It can develop suddenly on its own (spontaneous pneumothorax) or from injury (trauma) to the chest.  The chest wall is composed of bones, muscles, and cartilage. Any of these can be the source of the pain.  The bones can be bruised by injury.   The muscles or cartilage can be strained by coughing or overwork.   The cartilage can be affected by inflammation and become sore (costochondritis).  DIAGNOSIS  Lab tests or other studies, such as X-rays, an EKG, stress testing, or cardiac imaging, may be needed to find the cause of your pain.  TREATMENT   Treatment depends on what may be causing your chest pain. Treatment may include:   Acid blockers for heartburn.   Anti-inflammatory medicine.   Pain medicine for inflammatory conditions.   Antibiotics if an infection is present.   You may be advised to change lifestyle habits. This includes stopping smoking and avoiding caffeine and chocolate.   You may be advised to keep your head raised (elevated) when sleeping. This reduces the chance of acid going backward from your stomach into your esophagus.   Most of the time, nonspecific chest pain will improve within 2 to 3 days with rest and mild pain medicine.  HOME CARE INSTRUCTIONS     If antibiotics were prescribed, take the full amount even if you start to feel better.   For the next few days, avoid physical activities that bring on chest pain. Continue physical activities as directed.   Do not smoke cigarettes or drink alcohol until your symptoms are gone.   Only take over-the-counter or prescription medicine for pain, discomfort, or fever as directed by your caregiver.   Follow your caregiver's suggestions for further testing if your chest pain does not go away.   Keep any follow-up appointments you made. If you do not go to an appointment, you could develop lasting (chronic) problems with pain. If there is any problem keeping an appointment, you must call to reschedule.  SEEK MEDICAL CARE IF:   You think you are having problems from the medicine you are taking. Read your medicine instructions carefully.   Your chest pain does not go away, even after treatment.   You develop a rash with blisters on your chest.  SEEK IMMEDIATE MEDICAL CARE IF:   You have increased chest pain or pain that spreads to your arm, neck, jaw, back, or belly (abdomen).   You develop shortness of breath, an increasing cough, or you are coughing up blood.   You have severe back or abdominal pain, feel sick to your stomach (nauseous) or throw up (vomit).   You develop severe weakness, fainting, or chills.   You have an oral temperature above 102 F (38.9 C), not controlled by medicine.  THIS IS AN EMERGENCY. Do not wait to see if the pain will go away. Get medical help at once. Call your local emergency services (911 in [place] Do not drive yourself to the hospital. MAKE SURE YOU:   Understand these instructions.   Will watch your condition.   Will get help right away if you are not doing well or get worse.  Document Released: 03/17/2005 Document Revised: 02/17/2011 Document Reviewed: 01/11/2008 [facility] Patient Information 2012 [place], [place].

## 2011-08-20 NOTE — H&P (Signed)
Patient ID: Jacob Dalton MRN: [id number], DOB/AGE: 1945-07-30   Admit date: 08/20/2011   Primary Physician: Jacob Ghee, MD, MD Primary Cardiologist: Dr Jacob Dalton  HPI: 66 y/o Jacob Dalton who has a history of CAD. Pt had LAD DES 12/08. No other significant CAD. He was restudied in June of 2011 and 40% ISR treated medically.  He has chronic anemia and this am he was seeing his hematologist when he mentioned he had been having some Lt sided chest pain and Lt shoulder pain. He describes this as a deep ache. There is no associated nausea, vomiting , or diaphoresis. He denies any SOB. There is no radiation to his jaw as he had previously with his LAD stent. His POC in ER is negative. His EKG is WNL.   Problem List: Past Medical History  Diagnosis Date  . Heart disease   . CAD (coronary artery disease)   . Intestinal occlusion     TWISTED  . Multiple wounds     [place] Vet  . Hypertension     Past Surgical History  Procedure Date  . Abdominal mass resection   . Inner ear surgery      Allergies:  Allergies  Allergen Reactions  . Codeine      Home Medications  ASA 81mg  Plavix 75mg  Lisinopril 5mg  Wellbutrin XL 300mg  Zocor 20mg  Vit D Vit B12   History reviewed. No pertinent family history.   History   Social History  . Marital Status: Married    Spouse Name: N/A    Number of Children: N/A  . Years of Education: N/A   Occupational History  . Not on file.   Social History Main Topics  . Smoking status: Unknown If Ever Smoked  . Smokeless tobacco: Not on file  . Alcohol Use: Yes     BEER 1-2 PER DAY  . Drug Use:   . Sexually Active:    Other Topics Concern  . Not on file   Social History Narrative  . No narrative on file     Review of Systems: General: negative for chills, fever, night sweats or weight changes.  Cardiovascular: negative for chest pain, dyspnea on exertion, edema, orthopnea, palpitations, paroxysmal nocturnal dyspnea or shortness  of breath Dermatological: negative for rash Respiratory: negative for cough or wheezing Urologic: negative for hematuria Abdominal: negative for nausea, vomiting, diarrhea, bright red blood per rectum, melena, or hematemesis Neurologic: negative for visual changes, syncope, or dizziness All other systems reviewed and are otherwise negative except as noted above.  Physical Exam: Blood pressure 134/85, height 5\' 11"  (1.803 m), weight 72.122 kg (159 lb).  General appearance: alert, cooperative and no distress Neck: no adenopathy, no carotid bruit, no JVD, supple, symmetrical, trachea midline and thyroid not enlarged, symmetric, no tenderness/mass/nodules Lungs: clear to auscultation bilaterally Heart: regular rate and rhythm Abdomen: soft, non-tender; bowel sounds normal; no masses,  no organomegaly, surgical scar present Extremities: extremities normal, atraumatic, no cyanosis or edema Pulses: 2+ and symmetric Skin: Skin color, texture, turgor normal. No rashes or lesions Neurologic: Grossly normal  Labs:   Results for orders placed during the hospital encounter of 08/20/11 (from the past 24 hour(s))  POCT I-STAT TROPONIN I     Status: Normal   Collection Time   08/20/11  2:14 PM      Component Value Range   Troponin i, poc 0.00  0.00 - 0.08 (ng/mL)   Comment 3           POCT  I-STAT, CHEM 8     Status: Abnormal   Collection Time   08/20/11  2:15 PM      Component Value Range   Sodium 139  135 - 145 (mEq/L)   Potassium 4.0  3.5 - 5.1 (mEq/L)   Chloride 103  96 - 112 (mEq/L)   BUN 12  6 - 23 (mg/dL)   Creatinine, Ser 1.61  0.50 - 1.35 (mg/dL)   Glucose, Bld 096 (*) 70 - 99 (mg/dL)   Calcium, Ion 0.45  4.09 - 1.32 (mmol/L)   TCO2 26  0 - 100 (mmol/L)   Hemoglobin 13.6  13.0 - 17.0 (g/dL)   HCT 81.1  91.4 - 78.2 (%)     Radiology/Studies: Dg Chest 2 View  08/20/2011  *RADIOLOGY REPORT*  Clinical Data: Chest pain.  CHEST - 2 VIEW  Comparison: None.  Findings: Heart size is normal.   Both lungs are clear.  No evidence of pleural effusion.  No mass or lymphadenopathy identified. Surgical clips again seen in the epigastric region.  IMPRESSION: No active cardiopulmonary disease.  Original Report Authenticated By: Jacob Dalton, M.D.    EKG:NSR without acute changes  ASSESSMENT AND PLAN:   Active Problems:  Chest pain, symptoms not the same as with his PCI, (no radiation to jaw)  CAD, LAD DES 12/08, 40% ISR 6/11  GERD (gastroesophageal reflux disease)  Dyslipidemia  Plan-For now will check Troponin I at 6pm, He has had symptoms since 9am. MD to see, Consider home with OP Myoview if second Troponin negative.  Jacob Pretty, PA-C 08/20/2011, 4:47 PM  I have seen and examined the patient along with Jacob Gasman, PA.  I have reviewed the chart, notes and new data.  I agree with Jacob Dalton's note.  Brief Description: 66 y/o gentleman, quite pleasant, with a h/o LAD PCI for [place] in 2008 -- Sx included chest discomfort radiating to Jaw & Left arm as well as back.  Has had a cath in 11/2009 with ~40% ISR of that stent.  Has not seen Jacob Dalton (his cardiologist) in some time. Presented from his hematologists office b/c he noted in ROS to have a deep Left lower chest ache radiating some to back --> sent to ED for evaluation. Initial POC Troponin normal & f/u troponin @ 1730 also normal.    Key new complaints: Sx lasted from ~9 Am until ~noon. No current Sx, has frequent GI Sx that are somewhat similar.   Key examination changes: I agree with Jacob Dalton's exam - essentially normal Key new findings / data: Troponin & ECG noted - normal.  Sx are not quite consistent with his previous angina equivalent, he does not think so either.  Worsened with deep inspiration & moving L arm in certain ways. ?MSK vs. GI Sx.   PLAN: With 2nd set of Troponin negative & atypical Sx, I think that we are justified in discharge to home with planned OP NST then f/u with Jacob Dalton @ Jacob Dalton.  He is aware  of the potential for recurrent Sx that may well be anginal or MI, and is willing to accept responsibility -- given the option of O/n obs stay to get f/u set of troponin then d/c in AM.  Our office will contact him for scheduling TM Myoview.  Marykay Lex, M.D., M.S. [facility] [address] [place], [place]  [address]  [phone]  08/20/2011 8:20 PM

## 2011-08-21 NOTE — ED Provider Notes (Signed)
Medical screening examination/treatment/procedure(s) were conducted as a shared visit with non-physician practitioner(s) and myself.  I personally evaluated the patient during the encounter Patient with atypical chest pain symptoms. Sometimes pleuritic sometimes it's reproduced by movement and sometimes it comes without either. At one point it did radiate down his left arm. He denies any shortness of breath, nausea, vomiting, diaphoresis. He has extensive history of coronary artery disease with a stent and restenosis of his stent which was last seen in 2009. Spoke with [facility] and they will come and evaluate the patient. His initial set of troponins and i-STAT is negative. Chest x-ray and EKG are within normal limits. Gait will be the cardiologist decision whether the patient's is admitted at work goes home with close followup.  Gwyneth Sprout, MD 08/21/11 [phone]

## 2011-08-22 ENCOUNTER — Encounter: Payer: Self-pay | Admitting: Hematology and Oncology

## 2011-08-27 ENCOUNTER — Ambulatory Visit (HOSPITAL_BASED_OUTPATIENT_CLINIC_OR_DEPARTMENT_OTHER): Payer: Federal, State, Local not specified - PPO

## 2011-08-27 DIAGNOSIS — D649 Anemia, unspecified: Secondary | ICD-10-CM

## 2011-08-27 DIAGNOSIS — E538 Deficiency of other specified B group vitamins: Secondary | ICD-10-CM

## 2011-08-27 MED ORDER — SODIUM CHLORIDE 0.9 % IV SOLN
Freq: Once | INTRAVENOUS | Status: AC
  Administered 2011-08-27: 12:00:00 via INTRAVENOUS

## 2011-08-27 MED ORDER — FERUMOXYTOL INJECTION 510 MG/17 ML
1020.0000 mg | Freq: Once | INTRAVENOUS | Status: AC
  Administered 2011-08-27: 1020 mg via INTRAVENOUS
  Filled 2011-08-27: qty 34

## 2012-03-07 ENCOUNTER — Other Ambulatory Visit: Payer: Self-pay | Admitting: *Deleted

## 2012-03-07 DIAGNOSIS — D649 Anemia, unspecified: Secondary | ICD-10-CM

## 2012-03-08 ENCOUNTER — Other Ambulatory Visit: Payer: Federal, State, Local not specified - PPO | Admitting: Lab

## 2012-03-09 ENCOUNTER — Telehealth: Payer: Self-pay | Admitting: *Deleted

## 2012-03-09 NOTE — Telephone Encounter (Signed)
Pt did not come for lab on 03/08/12 as scheduled.   Called pt on cell phone and left message for pt to come in at  1130 am lab and f/u at 12 pm  On 03/10/12.

## 2012-03-10 ENCOUNTER — Ambulatory Visit: Payer: Federal, State, Local not specified - PPO | Admitting: Hematology and Oncology

## 2012-06-20 ENCOUNTER — Telehealth: Payer: Self-pay | Admitting: Hematology and Oncology

## 2012-06-20 NOTE — Telephone Encounter (Signed)
Returned pt's call re r/s the appts he cx'd in September 2013. S/w pt and informed him that LO no longer in the office and we could get him scheduled w/one of the other providers [facility] or [facility]). Per wanted to know when 1st available would be which is 1/15 w/HH. Per pt he was not at home where he could see his calendar and would call me back from home on 1/2.

## 2012-06-30 ENCOUNTER — Other Ambulatory Visit: Payer: Self-pay | Admitting: Nurse Practitioner

## 2012-07-05 ENCOUNTER — Telehealth: Payer: Self-pay | Admitting: Internal Medicine

## 2012-07-05 ENCOUNTER — Other Ambulatory Visit (HOSPITAL_COMMUNITY): Payer: Self-pay | Admitting: Cardiovascular Disease

## 2012-07-05 DIAGNOSIS — I714 Abdominal aortic aneurysm, without rupture: Secondary | ICD-10-CM

## 2012-07-05 NOTE — Telephone Encounter (Signed)
Pt called back today to r/s September appts and be reassigned to a new physician. Former pt of LO reassigned to [facility] and given appts for lb 1/31 and [facility] 2/3. Dates per pt. 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

## 2012-07-21 ENCOUNTER — Other Ambulatory Visit (HOSPITAL_BASED_OUTPATIENT_CLINIC_OR_DEPARTMENT_OTHER): Payer: Federal, State, Local not specified - PPO | Admitting: Lab

## 2012-07-21 DIAGNOSIS — D649 Anemia, unspecified: Secondary | ICD-10-CM

## 2012-07-21 LAB — CBC WITH DIFFERENTIAL/PLATELET
BASO%: 0.6 % (ref 0.0–2.0)
Basophils Absolute: 0 10*3/uL (ref 0.0–0.1)
EOS%: 2.1 % (ref 0.0–7.0)
HGB: 14.9 g/dL (ref 13.0–17.1)
MCH: 30.9 pg (ref 27.2–33.4)
MONO#: 0.4 10*3/uL (ref 0.1–0.9)
RDW: 13 % (ref 11.0–14.6)
WBC: 5.4 10*3/uL (ref 4.0–10.3)
lymph#: 1.2 10*3/uL (ref 0.9–3.3)

## 2012-07-21 LAB — COMPREHENSIVE METABOLIC PANEL (CC13)
ALT: 33 U/L (ref 0–55)
AST: 29 U/L (ref 5–34)
Albumin: 3.9 g/dL (ref 3.5–5.0)
BUN: 15.4 mg/dL (ref 7.0–26.0)
CO2: 23 mEq/L (ref 22–29)
Calcium: 8.8 mg/dL (ref 8.4–10.4)
Chloride: 104 mEq/L (ref 98–107)
Potassium: 4.3 mEq/L (ref 3.5–5.1)

## 2012-07-24 ENCOUNTER — Telehealth: Payer: Self-pay | Admitting: Internal Medicine

## 2012-07-24 ENCOUNTER — Ambulatory Visit (HOSPITAL_BASED_OUTPATIENT_CLINIC_OR_DEPARTMENT_OTHER): Payer: Federal, State, Local not specified - PPO | Admitting: Internal Medicine

## 2012-07-24 ENCOUNTER — Encounter: Payer: Self-pay | Admitting: Internal Medicine

## 2012-07-24 ENCOUNTER — Ambulatory Visit (HOSPITAL_BASED_OUTPATIENT_CLINIC_OR_DEPARTMENT_OTHER): Payer: Federal, State, Local not specified - PPO | Admitting: Lab

## 2012-07-24 VITALS — BP 112/76 | HR 79 | Temp 97.6°F | Resp 20 | Ht 71.0 in | Wt 159.2 lb

## 2012-07-24 DIAGNOSIS — R42 Dizziness and giddiness: Secondary | ICD-10-CM

## 2012-07-24 DIAGNOSIS — D649 Anemia, unspecified: Secondary | ICD-10-CM

## 2012-07-24 DIAGNOSIS — D509 Iron deficiency anemia, unspecified: Secondary | ICD-10-CM

## 2012-07-24 NOTE — Patient Instructions (Signed)
You have normal hemoglobin and hematocrit today. Continue on observation with repeat CBC and iron study in 6 months.

## 2012-07-24 NOTE — Progress Notes (Signed)
[facility] Telephone:([phone]) [phone]   Fax:([phone]  OFFICE PROGRESS NOTE  [name], MD [facility] 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  INTERVAL HISTORY: Jacob Dalton 67 y.o. male returns to the clinic today for annual followup visit. He is a former patient of Dr. Dalene Carrow was coming today to establish care with me after Dr. Dalene Carrow  left the practice. The patient was last seen in March of 2013 and he received Feraheme infusion at that time because of ferritin level of 89. He has been doing fine with no specific complaints except for mild fatigue. He occasional dizzy spells and imbalance issue but he relates most of it to his current antidepressant medications. He denied having any significant chest pain, shortness breath, cough or hemoptysis. He denied having any bleeding issues, bruises or ecchymosis. The patient had repeat CBC and comprehensive metabolic panel performed recently and he is here for evaluation and discussion of his lab results.   MEDICAL HISTORY: Past Medical History  Diagnosis Date  . Heart disease   . CAD (coronary artery disease)   . Intestinal occlusion     TWISTED  . Multiple wounds     [place] Vet  . Hypertension     ALLERGIES:  is allergic to codeine.  MEDICATIONS:  Current Outpatient Prescriptions  Medication Sig Dispense Refill  . buPROPion (WELLBUTRIN XL) 300 MG 24 hr tablet Take 300 mg by mouth daily.      . cholecalciferol (VITAMIN D) 1000 UNITS tablet Take 6,000 Units by mouth daily.      . Clobetasol Propionate 0.05 % shampoo Apply 1 application topically 2 (two) times a week.      . clopidogrel (PLAVIX) 75 MG tablet Take 75 mg by mouth daily.         . Cyanocobalamin (VITAMIN B12 PO) Take 1 tablet by mouth every other day.      . lisinopril (PRINIVIL,ZESTRIL) 5 MG tablet Take 5 mg by mouth daily.        . nebivolol (BYSTOLIC) 5 MG tablet Take 5 mg by mouth daily.        Marland Kitchen PRESCRIPTION MEDICATION Apply 1 application topically daily. Compound of Cetaphil / topical steroid, applied to rash daily.      . simethicone (MYLICON) 80 MG chewable tablet Chew 80-240 mg by mouth every 6 (six) hours as needed. For indigestion.      . sucralfate (CARAFATE) 1 G tablet Take 1 g by mouth 3 (three) times daily as needed. Used to avoid heartburn.      Marland Kitchen LORazepam (ATIVAN) 0.5 MG tablet Take 0.5 mg by mouth every 8 (eight) hours as needed. For anxiety.        SURGICAL HISTORY:  Past Surgical History  Procedure Date  . Abdominal mass resection   . Inner ear surgery     REVIEW OF SYSTEMS:  A comprehensive review of systems was negative except for: Constitutional: positive for fatigue Neurological: positive for coordination problems, dizziness and weakness   PHYSICAL EXAMINATION: General appearance: alert, cooperative, fatigued and no distress Head: Normocephalic, without obvious abnormality, atraumatic Neck: no adenopathy Lymph nodes: Cervical, supraclavicular, and axillary nodes normal. Resp: clear to auscultation bilaterally Cardio: regular rate and rhythm, S1, S2 normal,  no murmur, click, rub or gallop GI: soft, non-tender; bowel sounds normal; no masses,  no organomegaly Extremities: extremities normal, atraumatic, no cyanosis or edema  ECOG PERFORMANCE STATUS: 1 - Symptomatic but completely ambulatory  Blood pressure 112/76, pulse 79, temperature 97.6 F (36.4 C), temperature source Oral, resp. rate 20, height 5\' 11"  (1.803 m), weight 159 lb 3.2 oz (72.213 kg).  LABORATORY DATA: Lab Results  Component Value Date   WBC 5.4 07/21/2012   HGB 14.9 07/21/2012   HCT 43.2 07/21/2012   MCV 89.7 07/21/2012   PLT 139* 07/21/2012      Chemistry        Component Value Date/Time   NA 137 07/21/2012 1219   NA 139 08/20/2011 1415   K 4.3 07/21/2012 1219   K 4.0 08/20/2011 1415   CL 104 07/21/2012 1219   CL 103 08/20/2011 1415   CO2 23 07/21/2012 1219   CO2 27 04/13/2011 1628   BUN 15.4 07/21/2012 1219   BUN 12 08/20/2011 1415   CREATININE 1.2 07/21/2012 1219   CREATININE 1.10 08/20/2011 1415      Component Value Date/Time   CALCIUM 8.8 07/21/2012 1219   CALCIUM 9.5 04/13/2011 1628   ALKPHOS 72 07/21/2012 1219   ALKPHOS 52 07/31/2010 1612   AST 29 07/21/2012 1219   AST 30 07/31/2010 1612   ALT 33 07/21/2012 1219   ALT 37 07/31/2010 1612   BILITOT 0.69 07/21/2012 1219   BILITOT 1.0 07/31/2010 1612       RADIOGRAPHIC STUDIES: No results found.  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 The patient has intolerance to oral iron tablets and has been treated in the past with intravenous iron infusion. He is doing fine today with no specific complaints except for mild fatigue and dizzy spells that probably unrelated to his anemia as the patient has normal hemoglobin and hematocrit.  PLAN: I discussed the lab result with the patient today. I recommended for him to continue on observation with repeat CBC and iron study in 6 months. I advised the patient to get a referral from his primary care physician for neurological evaluation because of his dizziness and lack of coordination. I don't see a need to start the patient on iron infusion at this point. He was advised to call me immediately if he has any concerning symptoms in the interval. All questions were answered. The patient knows to call the clinic with any problems, questions or concerns. We can certainly see the patient much sooner if necessary.  I spent 15 minutes counseling the patient face to face. The total time spent in the appointment was 25  minutes.

## 2012-07-24 NOTE — Telephone Encounter (Signed)
sent pt back to the lab....gv and printed appt schedule for pt for Aug °

## 2012-07-26 ENCOUNTER — Ambulatory Visit (HOSPITAL_COMMUNITY)
Admission: RE | Admit: 2012-07-26 | Discharge: 2012-07-26 | Disposition: A | Discharge status: Home / Self Care | Payer: Federal, State, Local not specified - PPO | Source: Ambulatory Visit | Attending: Cardiovascular Disease | Admitting: Cardiovascular Disease

## 2012-07-26 DIAGNOSIS — I714 Abdominal aortic aneurysm, without rupture, unspecified: Secondary | ICD-10-CM | POA: Insufficient documentation

## 2012-07-26 HISTORY — PX: OTHER SURGICAL HISTORY: SHX169

## 2012-07-26 NOTE — Progress Notes (Signed)
Aorta Duplex Completed. Jacob Dalton D  

## 2013-01-22 ENCOUNTER — Ambulatory Visit (HOSPITAL_BASED_OUTPATIENT_CLINIC_OR_DEPARTMENT_OTHER): Payer: Federal, State, Local not specified - PPO | Admitting: Physician Assistant

## 2013-01-22 ENCOUNTER — Other Ambulatory Visit (HOSPITAL_BASED_OUTPATIENT_CLINIC_OR_DEPARTMENT_OTHER): Payer: Federal, State, Local not specified - PPO

## 2013-01-22 ENCOUNTER — Telehealth: Payer: Self-pay | Admitting: Internal Medicine

## 2013-01-22 VITALS — BP 145/78 | HR 82 | Temp 98.2°F | Resp 18 | Ht 71.0 in | Wt 160.3 lb

## 2013-01-22 DIAGNOSIS — D509 Iron deficiency anemia, unspecified: Secondary | ICD-10-CM

## 2013-01-22 DIAGNOSIS — R2681 Unsteadiness on feet: Secondary | ICD-10-CM

## 2013-01-22 DIAGNOSIS — D649 Anemia, unspecified: Secondary | ICD-10-CM

## 2013-01-22 DIAGNOSIS — K912 Postsurgical malabsorption, not elsewhere classified: Secondary | ICD-10-CM

## 2013-01-22 DIAGNOSIS — E538 Deficiency of other specified B group vitamins: Secondary | ICD-10-CM

## 2013-01-22 DIAGNOSIS — M25449 Effusion, unspecified hand: Secondary | ICD-10-CM

## 2013-01-22 LAB — CBC WITH DIFFERENTIAL/PLATELET
Basophils Absolute: 0 10*3/uL (ref 0.0–0.1)
Eosinophils Absolute: 0.1 10*3/uL (ref 0.0–0.5)
HGB: 14.2 g/dL (ref 13.0–17.1)
LYMPH%: 19.4 % (ref 14.0–49.0)
MCV: 87.7 fL (ref 79.3–98.0)
MONO%: 8.1 % (ref 0.0–14.0)
NEUT#: 3.1 10*3/uL (ref 1.5–6.5)
NEUT%: 69.9 % (ref 39.0–75.0)
Platelets: 137 10*3/uL — ABNORMAL LOW (ref 140–400)

## 2013-01-22 LAB — IRON AND TIBC CHCC
%SAT: 20 % (ref 20–55)
Iron: 79 ug/dL (ref 42–163)

## 2013-01-22 LAB — COMPREHENSIVE METABOLIC PANEL (CC13)
ALT: 20 U/L (ref 0–55)
CO2: 25 mEq/L (ref 22–29)
Calcium: 9.1 mg/dL (ref 8.4–10.4)
Chloride: 104 mEq/L (ref 98–109)
Creatinine: 1 mg/dL (ref 0.7–1.3)
Glucose: 126 mg/dl (ref 70–140)
Total Protein: 6.8 g/dL (ref 6.4–8.3)

## 2013-01-22 LAB — FERRITIN CHCC: Ferritin: 43 ng/ml (ref 22–316)

## 2013-01-22 NOTE — Progress Notes (Addendum)
[facility] Telephone:(336) [phone]   [phone]  SHARED VISIT PROGRESS NOTE  Jacob Barrios, MD [facility]. 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  INTERVAL HISTORY: Jacob Dalton 67 y.o. male returns to the clinic today for annual followup visit.  He has been doing fine with no specific complaints except for mild fatigue. He continues to have dizzy spells and imbalance issue initially thought that most of it to was due to his current antidepressant medications. He reports that he has not seen a Neurologist. He also complains of bilateral thumb joint pain and swelling and is interested in seeing a Rheumatologist. He states that he was placed on Topamax and daily Cialis by his urologist He denied having any significant chest pain, shortness breath, cough or hemoptysis. He denied having any bleeding issues, bruises or ecchymosis. The patient had repeat CBC and comprehensive metabolic panel performed recently and he is here for evaluation and discussion of his lab results. Of note, he was last given [facility] 08/20/2011.  MEDICAL HISTORY: Past Medical History  Diagnosis Date  . Heart disease   . CAD (coronary artery disease)   . Intestinal occlusion     TWISTED  . Multiple wounds     Jacob Dalton  . Hypertension     ALLERGIES:  is allergic to codeine.  MEDICATIONS:  Current Outpatient Prescriptions  Medication Sig Dispense Refill  . calcipotriene-betamethasone (TACLONEX SCALP) external suspension Apply topically daily.      Marland Kitchen buPROPion (WELLBUTRIN XL) 300 MG 24 hr tablet Take 300 mg by mouth daily.      . cholecalciferol (VITAMIN D) 1000 UNITS tablet Take 6,000  Units by mouth daily.      . Clobetasol Propionate 0.05 % shampoo Apply 1 application topically 2 (two) times a week.      . clopidogrel (PLAVIX) 75 MG tablet Take 75 mg by mouth daily.        . Cyanocobalamin (VITAMIN B12 PO) Take 1 tablet by mouth daily.       Marland Kitchen lisinopril (PRINIVIL,ZESTRIL) 5 MG tablet Take 2.5 mg by mouth daily.       Marland Kitchen LORazepam (ATIVAN) 0.5 MG tablet Take 0.5 mg by mouth every 8 (eight) hours as needed. For anxiety.      . nebivolol (BYSTOLIC) 5 MG tablet Take 2.5 mg by mouth daily.       Marland Kitchen PRESCRIPTION MEDICATION Apply 1 application topically daily. Compound of Cetaphil / topical steroid, applied to rash daily.      . simethicone (MYLICON) 80 MG chewable tablet Chew 80-240 mg by mouth every 6 (six) hours as needed. For indigestion.      . simvastatin (ZOCOR) 20 MG tablet       . sucralfate (CARAFATE) 1 G tablet Take 1 g by mouth 3 (three) times daily as needed. Used to avoid heartburn.       No current facility-administered medications for this visit.    SURGICAL HISTORY:  Past Surgical History  Procedure Laterality Date  . Abdominal mass resection    . Inner ear surgery      REVIEW OF SYSTEMS:  A comprehensive review of systems was negative except for: Constitutional: positive for fatigue Musculoskeletal: positive for  arthralgias Neurological: positive for coordination problems, dizziness and weakness   PHYSICAL EXAMINATION: General appearance: alert, cooperative, fatigued and no distress Head: Normocephalic, without obvious abnormality, atraumatic Neck: no adenopathy Lymph nodes: Cervical, supraclavicular, and axillary nodes normal. Resp: clear to auscultation bilaterally Cardio: regular rate and rhythm, S1, S2 normal, no murmur, click, rub or gallop GI: soft, non-tender; bowel sounds normal; no masses,  no organomegaly Extremities: extremities normal, atraumatic, no cyanosis or edema  ECOG PERFORMANCE STATUS: 1 - Symptomatic but completely  ambulatory  Blood pressure 145/78, pulse 82, temperature 98.2 F (36.8 C), temperature source Oral, resp. rate 18, height 5\' 11"  (1.803 m), weight 160 lb 4.8 oz (72.712 kg).  LABORATORY DATA: Lab Results  Component Value Date   WBC 4.5 01/22/2013   HGB 14.2 01/22/2013   HCT 41.3 01/22/2013   MCV 87.7 01/22/2013   PLT 137* 01/22/2013      Chemistry      Component Value Date/Time   NA 138 01/22/2013 1312   NA 139 08/20/2011 1415   K 3.9 01/22/2013 1312   K 4.0 08/20/2011 1415   CL 104 07/21/2012 1219   CL 103 08/20/2011 1415   CO2 25 01/22/2013 1312   CO2 27 04/13/2011 1628   BUN 13.8 01/22/2013 1312   BUN 12 08/20/2011 1415   CREATININE 1.0 01/22/2013 1312   CREATININE 1.10 08/20/2011 1415      Component Value Date/Time   CALCIUM 9.1 01/22/2013 1312   CALCIUM 9.5 04/13/2011 1628   ALKPHOS 61 01/22/2013 1312   ALKPHOS 52 07/31/2010 1612   AST 25 01/22/2013 1312   AST 30 07/31/2010 1612   ALT 20 01/22/2013 1312   ALT 37 07/31/2010 1612   BILITOT 0.69 01/22/2013 1312   BILITOT 1.0 07/31/2010 1612       RADIOGRAPHIC STUDIES: No results found.  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 The patient has intolerance to oral iron tablets and has been treated in the past with intravenous iron infusion, last given 08/20/2011. He is doing fine today with no specific complaints except for mild fatigue and dizzy spells that probably unrelated to his anemia as the patient has normal hemoglobin and hematocrit. His iron and B12 studies are within normal ranges. The patient was discussed with and seen by Dr. Arbutus Ped.. At this time we will release the patient back to his primary care physician for further follow up. Should he be in need of our services in the future, we will happily see him for re-evaluation and IV iron if needed. Patient was advised to see Dr. Kevan Ny regarding  referrals to Neurology for his dizziness and lack of coordination and Rheumatology for his thumb joint pain. Marland Kitchen Shagun Wordell E, PA_C   He was advised to call me immediately if he has any concerning symptoms in the interval. All questions were answered. The patient knows to call the clinic with any problems, questions or concerns. We can certainly see the patient much sooner if necessary.  ADDENDUM: Hematology/Oncology Attending: I have a face to face encounter with the patient today. 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 His hemoglobin and hematocrit as well as the iron study has been within the normal range over the last year and the patient  did not require any iron infusion during that time. I recommended for him to continue on observation with routine followup visit with his primary care physician Dr. Kevan Ny. I would see the patient on as-needed basis at this point if he has any significant decline in his hemoglobin, hematocrit or iron study which can be evaluated by Dr. Kevan Ny during his regular visit. The patient agreed to the current plan. Lajuana Matte., MD 01/22/2013

## 2013-01-22 NOTE — Patient Instructions (Addendum)
Follow up with Dr. Kevan Ny regarding your need to see a Neurologist for your dizziness and unsteadiness and with a Rheumatologist regarding your bilateral thumb joint pain and swelling We are releasing you to Dr. Kevan Ny regarding your anemia as long as your iron studies come back within normal ranges. We will gladly see you in the future should the need arise.

## 2013-01-22 NOTE — Telephone Encounter (Signed)
, °

## 2013-01-23 ENCOUNTER — Encounter: Payer: Self-pay | Admitting: Physician Assistant

## 2013-01-31 ENCOUNTER — Telehealth: Payer: Self-pay | Admitting: Cardiovascular Disease

## 2013-01-31 NOTE — Telephone Encounter (Signed)
Phil @ [facility] called to get OK for refill for patient's Bystolic 5 mg.  Sent request on 8/9 and yesterday but has not heard back. Please call.

## 2013-01-31 NOTE — Telephone Encounter (Signed)
CALLED AND FILLED #90 WITH 3 REFILLS

## 2013-02-08 ENCOUNTER — Telehealth: Payer: Self-pay | Admitting: Cardiovascular Disease

## 2013-02-08 MED ORDER — NITROGLYCERIN 0.4 MG SL SUBL: 0.4000 mg | SUBLINGUAL_TABLET | SUBLINGUAL | Status: DC | PRN

## 2013-02-08 NOTE — Telephone Encounter (Signed)
Patient states his nitrostat 0.4 mg is out of date and pharmacy will not refill.  New rx needs to be called to [facility].

## 2013-02-08 NOTE — Telephone Encounter (Signed)
Returned call.  Pt stated his NTG expired in June 2013 and the pharmacy doesn't have it on file.  Stated he carries it with him if he needs it, but doesn't really use it.  Pt advised to NOT take expired NTG and to always refill at least once every year.  Pt informed Rx will be sent to [facility].  Pt verbalized understanding and agreed w/ plan.  Refill(s) sent to pharmacy via [employer].

## 2013-02-12 ENCOUNTER — Telehealth: Payer: Self-pay | Admitting: Cardiovascular Disease

## 2013-02-12 NOTE — Telephone Encounter (Signed)
Rx sent 8.21.14.  Resent again after notifying pharmacy and informed they don't have Rx.

## 2013-02-12 NOTE — Telephone Encounter (Signed)
Need new prescription for nitroglycerin .

## 2013-03-15 ENCOUNTER — Encounter: Payer: Self-pay | Admitting: Cardiovascular Disease

## 2013-03-21 HISTORY — PX: TRANSTHORACIC ECHOCARDIOGRAM: SHX275

## 2013-04-09 ENCOUNTER — Ambulatory Visit (HOSPITAL_COMMUNITY)
Admission: RE | Admit: 2013-04-09 | Discharge: 2013-04-09 | Disposition: A | Discharge status: Home / Self Care | Payer: Federal, State, Local not specified - PPO | Source: Ambulatory Visit | Attending: Cardiovascular Disease | Admitting: Cardiovascular Disease

## 2013-04-09 ENCOUNTER — Other Ambulatory Visit (HOSPITAL_COMMUNITY): Payer: Self-pay | Admitting: Internal Medicine

## 2013-04-09 DIAGNOSIS — R011 Cardiac murmur, unspecified: Secondary | ICD-10-CM | POA: Insufficient documentation

## 2013-04-09 NOTE — Progress Notes (Signed)
2D Echo Performed 04/09/2013    Naisha Wisdom, RCS  

## 2013-04-12 ENCOUNTER — Encounter: Payer: Self-pay | Admitting: *Deleted

## 2013-04-13 ENCOUNTER — Telehealth: Payer: Self-pay | Admitting: Cardiovascular Disease

## 2013-04-13 NOTE — Telephone Encounter (Signed)
Message forwarded to J. Jeannetta Nap, Charity fundraiser.  RAW to [facility] patient.  See results.

## 2013-04-13 NOTE — Telephone Encounter (Signed)
Returning your call from yesterday-thinks it was about his echo results.

## 2013-04-13 NOTE — Telephone Encounter (Signed)
Attempted to return patient's call at number he called from - could not reach. Called home phone and spoke with wife Bonita Quin. She is able to take health info over the phone - gave her her husband's echo results and asked if he would like to come in to see Dr. Rennis Golden prior to his recall date in 08/2013. She said she would communicate results to husband and ask if he wanted to come in sooner.

## 2013-06-17 ENCOUNTER — Ambulatory Visit (INDEPENDENT_AMBULATORY_CARE_PROVIDER_SITE_OTHER): Payer: Federal, State, Local not specified - PPO | Admitting: Internal Medicine

## 2013-06-17 VITALS — BP 105/65 | HR 73 | Temp 98.6°F | Resp 18 | Ht 70.0 in | Wt 167.0 lb

## 2013-06-17 DIAGNOSIS — J019 Acute sinusitis, unspecified: Secondary | ICD-10-CM

## 2013-06-17 MED ORDER — AMOXICILLIN 875 MG PO TABS: 875.0000 mg | ORAL_TABLET | Freq: Two times a day (BID) | ORAL | Status: DC

## 2013-06-17 NOTE — Progress Notes (Signed)
Subjective:    Patient ID: Jacob Dalton, male    DOB: December 11, 1945, 67 y.o.   MRN: [id number]  HPI This chart was scribed for [facility] by Smiley Houseman, Scribe. This patient was seen in room 12 and the patient's care was started at 3:08 PM.  HPI Comments: Jacob Dalton is a 67 y.o. male who presents to the [facility] complaining of symptoms of a sinus infection that started about 2 days ago.  Pt has associated nasal congestion, rhinorrhea, and slight non-productive cough.  Pt denies fever, ED temperature is 98.6.  Pt states he has tried saline solution, netti pot, and gargling with salt water with some relief.  Pt states his sick contact was most likely his wife, who was diagnosed with a sinus infection.      Past Surgical History  Procedure Laterality Date  . Abdominal mass resection    . Inner ear surgery    . Appendectomy    . Cholecystectomy      Family History  Problem Relation Age of Onset  . Heart disease Mother   . Heart disease Father   . Heart disease Brother     History   Social History  . Marital Status: Married    Spouse Name: N/A    Number of Children: N/A  . Years of Education: N/A   Occupational History  . Not on file.   Social History Main Topics  . Smoking status: Former Smoker    Types: Pipe    Quit date: 06/21/1982  . Smokeless tobacco: Not on file  . Alcohol Use: Yes     Comment: BEER 1-2 PER DAY  . Drug Use: No  . Sexual Activity: Not on file   Other Topics Concern  . Not on file   Social History Narrative  . No narrative on file    Allergies  Allergen Reactions  . Codeine     Stomach cramps and nausea  . Nsaids     Patient Active Problem List   Diagnosis Date Noted  . Chest pain 08/20/2011  . Anemia 08/20/2011  . Vitamin B12 deficiency 08/20/2011  . CAD, LAD DES 12/08, 40% ISR 6/11 08/20/2011  . GERD (gastroesophageal reflux disease) 08/20/2011  . Dyslipidemia 08/20/2011    Review of  Systems  Constitutional: Negative for fever and chills.  HENT: Positive for congestion, rhinorrhea and sore throat.   Respiratory: Positive for cough. Negative for shortness of breath.   Cardiovascular: Negative for chest pain.  Gastrointestinal: Negative for nausea, vomiting, abdominal pain and diarrhea.  Musculoskeletal: Negative for back pain.  Skin: Negative for color change and rash.  Neurological: Negative for syncope.  All other systems reviewed and are negative.       Objective:   Physical Exam  Nursing note and vitals reviewed. Constitutional: He is oriented to person, place, and time. He appears well-developed and well-nourished. No distress.  HENT:  Head: Normocephalic and atraumatic.  Nares boggy w/ purulent d/c and tender max areas  Eyes: Conjunctivae and EOM are normal. Right eye exhibits no discharge. Left eye exhibits no discharge.  Neck: Normal range of motion.  Cardiovascular: Normal rate.   Pulmonary/Chest: Effort normal and breath sounds normal. No respiratory distress.  Musculoskeletal: Normal range of motion.  Neurological: He is alert and oriented to person, place, and time.  Skin: Skin is warm and dry.  Psychiatric: He has a normal mood and affect. His behavior is normal.   Triage  Vitals: BP 105/65  Pulse 73  Temp(Src) 98.6 F (37 C) (Oral)  Resp 18  Ht 5\' 10"  (1.778 m)  Wt 167 lb (75.751 kg)  BMI 23.96 kg/m2  SpO2 97%  DIAGNOSTIC STUDIES: Oxygen Saturati on is 97% on RA, normal by my interpretation.    COORDINATION OF CARE: 3:11 PM-Patient informed of current plan of treatment and evaluation and agrees with plan.       Assessment & Plan:  Acute sinusitis, unspecified   Meds ordered this encounter  Medications  . amoxicillin (AMOXIL) 875 MG tablet    Sig: Take 1 tablet (875 mg total) by mouth 2 (two) times daily.    Dispense:  20 tablet    Refill:  0     I have completed the patient encounter in its entirety as documented by the  scribe, with editing by me where necessary. Yechezkel Fertig P. Merla Riches, M.D.

## 2013-06-21 HISTORY — PX: STAPEDECTOMY: SHX2435

## 2013-09-11 ENCOUNTER — Encounter: Payer: Self-pay | Admitting: *Deleted

## 2013-09-13 ENCOUNTER — Ambulatory Visit (INDEPENDENT_AMBULATORY_CARE_PROVIDER_SITE_OTHER): Payer: Federal, State, Local not specified - PPO | Admitting: Cardiology

## 2013-09-13 ENCOUNTER — Encounter: Payer: Self-pay | Admitting: Cardiology

## 2013-09-13 VITALS — BP 130/72 | HR 72 | Ht 70.0 in | Wt 160.6 lb

## 2013-09-13 DIAGNOSIS — R079 Chest pain, unspecified: Secondary | ICD-10-CM

## 2013-09-13 DIAGNOSIS — E785 Hyperlipidemia, unspecified: Secondary | ICD-10-CM

## 2013-09-13 DIAGNOSIS — I2510 Atherosclerotic heart disease of native coronary artery without angina pectoris: Secondary | ICD-10-CM

## 2013-09-13 DIAGNOSIS — Z9861 Coronary angioplasty status: Secondary | ICD-10-CM

## 2013-09-13 DIAGNOSIS — N529 Male erectile dysfunction, unspecified: Secondary | ICD-10-CM

## 2013-09-13 MED ORDER — ATORVASTATIN CALCIUM 10 MG PO TABS: 10.0000 mg | ORAL_TABLET | Freq: Every day | ORAL | Status: DC

## 2013-09-13 NOTE — Patient Instructions (Signed)
STOP ASPIRIN ,LISINOPRIL  Stop  Simvastatin, in three weeks start  Atorvastatin 10 mg one tablet at bedtime  Your physician wants you to follow-up in 6 months Dr Ellyn Hack. You will receive a reminder letter in the mail two months in advance. If you don't receive a letter, please call our office to schedule the follow-up appointment.   Marland Kitchen

## 2013-09-14 ENCOUNTER — Encounter: Payer: Self-pay | Admitting: Cardiology

## 2013-09-14 DIAGNOSIS — N529 Male erectile dysfunction, unspecified: Secondary | ICD-10-CM | POA: Insufficient documentation
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
## 2013-09-14 NOTE — Assessment & Plan Note (Signed)
Is currently on simvastatin. However within having some memory loss issues, inclined to switch him from simvastatin to atorvastatin. He'll stop his simvastatin for about 3-4 weeks and then start atorvastatin 10 mg. The last labs I seen on Humira from March 2013 which are pretty well controlled. My understanding is that these values are being followed by his PCP.

## 2013-09-14 NOTE — Assessment & Plan Note (Addendum)
No active angina symptoms. The symptom is describing is most likely GI related. Is not associated with exertion. He is currently on aspirin plus Plavix (with a Cypher stent this is warranted), beta blocker and statin along with ACE inhibitor.  At this point he is far enough out from his PCI that we can probably stop the aspirin to avoid any additional GI issues. He really doesn't have hypertension, and has more symptomatic with being on the ACE inhibitor than I would like. I think we can probably stop the ACE inhibitor which will typically the concern for his cough as well as the erectile dysfunction issue.  Plan: Stop aspirin and ACE inhibitor. Continue beta blocker at current dose, may need to increase once ACE inhibitor has been discontinued.

## 2013-09-14 NOTE — Assessment & Plan Note (Signed)
Hopefully with holding the ACE inhibitor, blood pressure to allow improved perfusion.

## 2013-09-14 NOTE — Assessment & Plan Note (Signed)
Most likely this is GI related. Not similar to his previous anginal equivalent.

## 2013-10-09 ENCOUNTER — Other Ambulatory Visit: Payer: Self-pay

## 2013-10-09 MED ORDER — CLOPIDOGREL BISULFATE 75 MG PO TABS: 75.0000 mg | ORAL_TABLET | Freq: Every day | ORAL | Status: DC

## 2013-10-09 NOTE — Telephone Encounter (Signed)
Rx was sent to pharmacy electronically. 

## 2014-01-11 ENCOUNTER — Other Ambulatory Visit: Payer: Self-pay

## 2014-01-11 MED ORDER — NEBIVOLOL HCL 5 MG PO TABS: 2.5000 mg | ORAL_TABLET | Freq: Every day | ORAL | Status: DC

## 2014-01-11 NOTE — Telephone Encounter (Signed)
Rx was sent to pharmacy electronically. 

## 2014-01-21 ENCOUNTER — Telehealth: Payer: Self-pay | Admitting: Cardiology

## 2014-01-22 NOTE — Telephone Encounter (Signed)
Closed encounter °

## 2014-04-22 ENCOUNTER — Encounter: Payer: Self-pay | Admitting: Cardiology

## 2014-04-22 ENCOUNTER — Ambulatory Visit (INDEPENDENT_AMBULATORY_CARE_PROVIDER_SITE_OTHER): Payer: Medicare Other | Admitting: Cardiology

## 2014-04-22 VITALS — BP 110/78 | HR 76 | Ht 69.5 in | Wt 154.1 lb

## 2014-04-22 DIAGNOSIS — Z79899 Other long term (current) drug therapy: Secondary | ICD-10-CM

## 2014-04-22 DIAGNOSIS — R079 Chest pain, unspecified: Secondary | ICD-10-CM

## 2014-04-22 DIAGNOSIS — R739 Hyperglycemia, unspecified: Secondary | ICD-10-CM

## 2014-04-22 DIAGNOSIS — E785 Hyperlipidemia, unspecified: Secondary | ICD-10-CM

## 2014-04-22 DIAGNOSIS — I2510 Atherosclerotic heart disease of native coronary artery without angina pectoris: Secondary | ICD-10-CM

## 2014-04-22 DIAGNOSIS — R7309 Other abnormal glucose: Secondary | ICD-10-CM

## 2014-04-22 DIAGNOSIS — Z9861 Coronary angioplasty status: Secondary | ICD-10-CM

## 2014-04-22 NOTE — Patient Instructions (Signed)
Your physician wants you to follow-up in 6 MONTHS  DR HARDING 30 MINS APPT.  You will receive a reminder letter in the mail two months in advance. If you don't receive a letter, please call our office to schedule the follow-up appointment.   PLEASE DO LABS - NOTHING TO EAT OR DRINK
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
## 2014-04-23 DIAGNOSIS — R739 Hyperglycemia, unspecified: Secondary | ICD-10-CM | POA: Insufficient documentation

## 2014-04-23 NOTE — Assessment & Plan Note (Signed)
His PCP indicated that he had an elevated A1c and glucose levels at a recent checkup.  Will check A1c along with lipids and chemistries.

## 2014-04-23 NOTE — Assessment & Plan Note (Signed)
He stopped atorvastatin after being changed from simvastatin for intolerance. I think it makes sense to hold off for now. We can recheck a lipid panel to see where he stands. Based on the results, we can consider either trying a more potent statin with less side effects such as Crestor at a low dose 3-4 days a week versus a higher dose of pravastatin which has less of the memory issues. I would like to know what her treating prior to making any adjustments. He is no longer taking atorvastatin or simvastatin.

## 2014-04-23 NOTE — Assessment & Plan Note (Signed)
He probably has a lot of GI related discomfort as well as musculoskeletal discomfort in his shoulders. This is not similar to his anginal equivalent. The fact is able to walk 5 miles not have these symptoms is relatively reassuring.

## 2014-04-23 NOTE — Assessment & Plan Note (Addendum)
Stable, very active with no anginal or heart failure symptoms. He is only on clopidogrel for antiplatelet treatment. He is on beta blocker and when necessary nitroglycerin. His blood pressure is always been well controlled on the Bystolic. No room to use ARB or ACE inhibitor.  Is having less GI issues of being off the aspirin. I think is fine being on Plavix alone.

## 2014-04-26 ENCOUNTER — Telehealth: Payer: Self-pay | Admitting: Cardiology

## 2014-04-26 LAB — HEMOGLOBIN A1C
Hgb A1c MFr Bld: 6.4 % — ABNORMAL HIGH (ref <5.7)
Mean Plasma Glucose: 137 mg/dL — ABNORMAL HIGH (ref <117)

## 2014-04-26 NOTE — Telephone Encounter (Signed)
Pt had lab work yesterday and wants to know if the results are back. He wants to know about what to do about his cholesterol medicine.

## 2014-04-26 NOTE — Telephone Encounter (Signed)
Spoke with patient and let him know the results are not back yet

## 2014-04-27 LAB — COMPREHENSIVE METABOLIC PANEL
ALT: 19 U/L (ref 0–53)
AST: 22 U/L (ref 0–37)
Albumin: 4.4 g/dL (ref 3.5–5.2)
Alkaline Phosphatase: 61 U/L (ref 39–117)
BUN: 15 mg/dL (ref 6–23)
CO2: 26 mEq/L (ref 19–32)
Calcium: 9 mg/dL (ref 8.4–10.5)
Chloride: 101 mEq/L (ref 96–112)
Creat: 1.03 mg/dL (ref 0.50–1.35)
Glucose, Bld: 150 mg/dL — ABNORMAL HIGH (ref 70–99)
Potassium: 4 mEq/L (ref 3.5–5.3)
Sodium: 137 mEq/L (ref 135–145)
Total Bilirubin: 0.6 mg/dL (ref 0.2–1.2)
Total Protein: 6.6 g/dL (ref 6.0–8.3)

## 2014-04-27 LAB — LIPID PANEL
Cholesterol: 143 mg/dL (ref 0–200)
HDL: 58 mg/dL (ref >39)
LDL Cholesterol: 71 mg/dL (ref 0–99)
Total CHOL/HDL Ratio: 2.5 Ratio
Triglycerides: 71 mg/dL (ref <150)
VLDL: 14 mg/dL (ref 0–40)

## 2014-05-06 ENCOUNTER — Telehealth: Payer: Self-pay | Admitting: *Deleted

## 2014-05-06 DIAGNOSIS — E785 Hyperlipidemia, unspecified: Secondary | ICD-10-CM

## 2014-05-06 DIAGNOSIS — Z79899 Other long term (current) drug therapy: Secondary | ICD-10-CM

## 2014-05-06 NOTE — Telephone Encounter (Signed)
-----   Message from Leonie Man, MD sent at 05/06/2014 11:41 AM EST ----- Cholesterol still looks "at goal" but not as well controlled  - can recheck in 6 months -- if it continues to climb, will try low dose Pravachol.  Leonie Man, MD

## 2014-05-06 NOTE — Telephone Encounter (Signed)
6 MONTHS (MAY 2016) LIPID , CMP WILL MAIL LAB SLIP PATIENT AWARE

## 2014-05-06 NOTE — Telephone Encounter (Signed)
Spoke to patient. Result given . Verbalized understanding Patient request a copy labs sent Dr Inda Merlin Labs routed

## 2014-05-07 NOTE — Telephone Encounter (Signed)
Pt was notified on November 16,2015 by Trixie Dredge, RN  (see documentation on lab results)

## 2014-08-02 ENCOUNTER — Telehealth: Payer: Self-pay | Admitting: *Deleted

## 2014-08-02 ENCOUNTER — Other Ambulatory Visit: Payer: Self-pay | Admitting: Cardiology

## 2014-08-02 NOTE — Telephone Encounter (Signed)
Per Dr Ellyn Hack Faxed clearance for colonoscopy on 09/09/14 with Dr Oletta Lamas May stop PLAVIX 5 DAYS PRE-OP  (09/04/14) RESTART 1-2 DAYS POST-OP  (3/22 or 3/23 /16)

## 2014-08-02 NOTE — Telephone Encounter (Signed)
Rx(s) sent to pharmacy electronically.  

## 2014-08-24 ENCOUNTER — Other Ambulatory Visit: Payer: Self-pay | Admitting: Cardiology

## 2014-09-09 ENCOUNTER — Other Ambulatory Visit: Payer: Self-pay | Admitting: Gastroenterology

## 2014-10-03 ENCOUNTER — Telehealth: Payer: Self-pay | Admitting: *Deleted

## 2014-10-03 DIAGNOSIS — E785 Hyperlipidemia, unspecified: Secondary | ICD-10-CM

## 2014-10-03 DIAGNOSIS — Z79899 Other long term (current) drug therapy: Secondary | ICD-10-CM

## 2014-10-03 NOTE — Telephone Encounter (Signed)
-----   Message from Raiford Simmonds, RN sent at 05/06/2014  5:22 PM EST ----- 6 MONTHS (MAY 2016)LIPID ,CMP MAIL IN April 2016

## 2014-10-03 NOTE — Telephone Encounter (Signed)
Mail letter and lab slip CMP , LIPID FROM LABS OBTAIN IN 04/2014

## 2014-10-24 ENCOUNTER — Ambulatory Visit (INDEPENDENT_AMBULATORY_CARE_PROVIDER_SITE_OTHER): Payer: Medicare Other | Admitting: Cardiology

## 2014-10-24 ENCOUNTER — Encounter: Payer: Self-pay | Admitting: Cardiology

## 2014-10-24 VITALS — BP 118/70 | HR 90 | Ht 66.0 in | Wt 148.6 lb

## 2014-10-24 DIAGNOSIS — I2510 Atherosclerotic heart disease of native coronary artery without angina pectoris: Secondary | ICD-10-CM

## 2014-10-24 DIAGNOSIS — R079 Chest pain, unspecified: Secondary | ICD-10-CM | POA: Diagnosis not present

## 2014-10-24 DIAGNOSIS — E785 Hyperlipidemia, unspecified: Secondary | ICD-10-CM | POA: Diagnosis not present

## 2014-10-24 DIAGNOSIS — Z9861 Coronary angioplasty status: Secondary | ICD-10-CM | POA: Diagnosis not present

## 2014-10-24 NOTE — Patient Instructions (Addendum)
YOU MAY USE CLARITIN AND ALLEGRA  FOR ALLEREGIES  - NOTHING WITH A DECONGESTANT.   TRY CoQ 10 TABLET START OFF AT 100 MG DAILY  INCREASE TO 300 MG DAILY - OVER 3 MONTH PERIOD.   Your physician wants you to follow-up in   [place]. You will receive a reminder letter in the mail two months in advance. If you don't receive a letter, please call our office to schedule the follow-up appointment.
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
## 2014-10-27 ENCOUNTER — Encounter: Payer: Self-pay | Admitting: Cardiology

## 2014-10-27 NOTE — Assessment & Plan Note (Signed)
Be stable with no active angina or heart failure symptoms. He does have some atypical chest discomfort that is probably either GERD or musculoskeletal in nature. He doesn't think is like his previous anginal symptoms. He has not had to use any when necessary nitroglycerin. Continue to be on ropinirole and beta blocker. He has not had blood pressure room in the past in order to allow the use of an ACE inhibitor or ARB. We did stop aspirin due to GI issues. He is far enough out from his stent to be on Plavix alone in lieu of aspirin.

## 2014-10-27 NOTE — Assessment & Plan Note (Signed)
Currently off of statin altogether. Last lipid check was relatively at goal, but not as good as it had been on statin. He was supposed to have lab work done prior to this visit which needs to be reordered. I would like to see where he stands and may be consider using Zetia or potentially pravastatin.

## 2014-10-27 NOTE — Assessment & Plan Note (Signed)
As long as he is not having active anginal type chest pain with his walking routine, they were okay not pursuing stress test. Most likely GI or musculoskeletal related as it affects his shoulders as well.

## 2014-11-13 ENCOUNTER — Other Ambulatory Visit: Payer: Self-pay | Admitting: Cardiology

## 2014-11-13 NOTE — Telephone Encounter (Signed)
Rx(s) sent to pharmacy electronically.  

## 2015-03-13 ENCOUNTER — Telehealth: Payer: Self-pay | Admitting: Cardiology

## 2015-03-13 NOTE — Telephone Encounter (Signed)
Received request for appointment and surgical clearance from [facility] for Dr Ellyn Hack.  Given to [employer] (medical records) for Dr Allison Quarry appointment on 03/24/15. lp

## 2015-03-24 ENCOUNTER — Ambulatory Visit (INDEPENDENT_AMBULATORY_CARE_PROVIDER_SITE_OTHER): Payer: Medicare Other | Admitting: Cardiology

## 2015-03-24 VITALS — BP 122/82 | HR 67 | Ht 69.0 in | Wt 153.2 lb

## 2015-03-24 DIAGNOSIS — D689 Coagulation defect, unspecified: Secondary | ICD-10-CM | POA: Diagnosis not present

## 2015-03-24 DIAGNOSIS — I2510 Atherosclerotic heart disease of native coronary artery without angina pectoris: Secondary | ICD-10-CM

## 2015-03-24 DIAGNOSIS — E785 Hyperlipidemia, unspecified: Secondary | ICD-10-CM

## 2015-03-24 DIAGNOSIS — Z01818 Encounter for other preprocedural examination: Secondary | ICD-10-CM

## 2015-03-24 DIAGNOSIS — Z01810 Encounter for preprocedural cardiovascular examination: Secondary | ICD-10-CM | POA: Diagnosis not present

## 2015-03-24 DIAGNOSIS — Z9861 Coronary angioplasty status: Secondary | ICD-10-CM

## 2015-03-24 MED ORDER — NITROGLYCERIN 0.4 MG SL SUBL: 0.4000 mg | SUBLINGUAL_TABLET | SUBLINGUAL | Status: DC | PRN
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
## 2015-03-24 NOTE — Patient Instructions (Addendum)
LABS - CMP,LIPID,CBC, PT ,PTT- NOTHING TO EAT OR DRINK THE MORNING OF THE LABS  HOLD PLAVIX 5 DAYS PRIOR TO EYE SURGERY AND HOLD 5 DAYS AFTER  EYE SURGERY   Your physician wants you to follow-up in 12 MONTHS WITH DR HARDING. 30 MIN  You will receive a reminder letter in the mail two months in advance. If you don't receive a letter, please call our office to schedule the follow-up appointment.

## 2015-03-25 ENCOUNTER — Encounter: Payer: Self-pay | Admitting: Cardiology

## 2015-03-25 DIAGNOSIS — Z01810 Encounter for preprocedural cardiovascular examination: Secondary | ICD-10-CM | POA: Insufficient documentation

## 2015-03-25 NOTE — Assessment & Plan Note (Signed)
Currently no longer on statin for memory issues. He was supposed to have labs checked after his last visit, but not done. He needs to have labs checked for his preop evaluation. I simply order fasting lipid panel in addition to the preop labs including CMP, CBC as well as coags.

## 2015-03-25 NOTE — Assessment & Plan Note (Signed)
No typical anginal pain in a gentleman who is quite aware of his symptoms. He does have some atypical chest discomfort but no active angina or heart failure symptoms. He is on a beta blocker and statin as well as omega-3 fatty acids.  No longer on aspirin as bruising. No longer on statin to memory loss issues.

## 2015-03-25 NOTE — Assessment & Plan Note (Signed)
Jacob Dalton has history of coronary disease status post PCI with no active symptoms of angina. No heart failure symptoms. No history of stroke/cerebrovascular disease. Relatively normal renal function.  Nondiabetic.  With no active symptoms and low risk surgery, recommendation will be to proceed to the L4 without any further cardiac evaluation. Okay to stop Plavix preop 5-7 days and restart postop 3-5 days.  PREOPERATIVE CARDIAC RISK ASSESSMENT - per ACC/AHA Guidelines  Revised Cardiac Risk Index:  High Risk Surgery: no; ear surgery  Defined as Intraperitoneal, intrathoracic or suprainguinal vascular  Active CAD: no; no active symptoms/has been revascularized  CHF: no;  Cerebrovascular Disease: no;   Diabetes: no; On Insulin: no  CKD (Cr >~ 2): no;   Total: 0 Estimated Risk of Adverse Outcome: LOW - Low Risk Patient for Low Risk Surgery  Estimated Risk of MI, PE, VF/VT (Cardiac Arrest), Complete Heart Block: <1%   ACC/AHA Guidelines for "Clearance":  Step 1 - Need for Emergency Surgery: No:   If Yes - go straight to OR with perioperative surveillance  Step 2 - Active Cardiac Conditions (Unstable Angina, Decompensated HF, Significant  Arrhytmias - Complete HB, Mobitz II, Symptomatic VT or SVT, Severe Aortic Stenosis - mean gradient > 40 mmHg, Valve area < 1.0 cm2):   No:   If Yes - Evaluate & Treat per ACC/AHA Guidelines  Step 3 -  Low Risk Surgery: Yes  If Yes --> proceed to OR  If No --> Step 4  Step 4 - Functional Capacity >= 4 METS without symptoms: Yes  If Yes --> proceed to OR  If No --> Step 5  Step 5 --  Clinical Risk Factors (CRF) - see above   No CRFs: Yes  If Yes --> Proceed to OR

## 2015-04-17 LAB — CBC WITH DIFFERENTIAL/PLATELET
Basophils Absolute: 0 10*3/uL (ref 0.0–0.1)
Basophils Relative: 1 % (ref 0–1)
Eosinophils Absolute: 0.2 10*3/uL (ref 0.0–0.7)
Eosinophils Relative: 4 % (ref 0–5)
HCT: 40.9 % (ref 39.0–52.0)
Hemoglobin: 14 g/dL (ref 13.0–17.0)
Lymphocytes Relative: 23 % (ref 12–46)
Lymphs Abs: 0.9 10*3/uL (ref 0.7–4.0)
MCH: 29.7 pg (ref 26.0–34.0)
MCHC: 34.2 g/dL (ref 30.0–36.0)
MCV: 86.8 fL (ref 78.0–100.0)
MPV: 10.2 fL (ref 8.6–12.4)
Monocytes Absolute: 0.3 10*3/uL (ref 0.1–1.0)
Monocytes Relative: 8 % (ref 3–12)
Neutro Abs: 2.5 10*3/uL (ref 1.7–7.7)
Neutrophils Relative %: 64 % (ref 43–77)
Platelets: 138 10*3/uL — ABNORMAL LOW (ref 150–400)
RBC: 4.71 MIL/uL (ref 4.22–5.81)
RDW: 13.3 % (ref 11.5–15.5)
WBC: 3.9 10*3/uL — ABNORMAL LOW (ref 4.0–10.5)

## 2015-04-17 LAB — COMPREHENSIVE METABOLIC PANEL
ALT: 20 U/L (ref 9–46)
AST: 20 U/L (ref 10–35)
Albumin: 4.2 g/dL (ref 3.6–5.1)
Alkaline Phosphatase: 60 U/L (ref 40–115)
BILIRUBIN TOTAL: 0.5 mg/dL (ref 0.2–1.2)
BUN: 16 mg/dL (ref 7–25)
CALCIUM: 8.7 mg/dL (ref 8.6–10.3)
CHLORIDE: 105 mmol/L (ref 98–110)
CO2: 28 mmol/L (ref 20–31)
Creat: 1.01 mg/dL (ref 0.70–1.18)
GLUCOSE: 143 mg/dL — AB (ref 65–99)
POTASSIUM: 4 mmol/L (ref 3.5–5.3)
Sodium: 139 mmol/L (ref 135–146)
Total Protein: 6.3 g/dL (ref 6.1–8.1)

## 2015-04-17 LAB — CBC
HEMATOCRIT: 40.9 % (ref 39.0–52.0)
HEMOGLOBIN: 14 g/dL (ref 13.0–17.0)
MCH: 29.7 pg (ref 26.0–34.0)
MCHC: 34.2 g/dL (ref 30.0–36.0)
MCV: 86.8 fL (ref 78.0–100.0)
MPV: 10.2 fL (ref 8.6–12.4)
Platelets: 138 10*3/uL — ABNORMAL LOW (ref 150–400)
RBC: 4.71 MIL/uL (ref 4.22–5.81)
RDW: 13.3 % (ref 11.5–15.5)
WBC: 3.9 10*3/uL — AB (ref 4.0–10.5)

## 2015-04-17 LAB — LIPID PANEL
CHOLESTEROL: 150 mg/dL (ref 125–200)
HDL: 66 mg/dL (ref >=40)
LDL CALC: 70 mg/dL (ref <130)
TRIGLYCERIDES: 71 mg/dL (ref <150)
Total CHOL/HDL Ratio: 2.3 Ratio (ref <=5.0)
VLDL: 14 mg/dL (ref <30)

## 2015-04-17 LAB — PROTIME-INR
INR: 0.97 (ref <1.50)
Prothrombin Time: 13 seconds (ref 11.6–15.2)

## 2015-04-17 LAB — APTT: aPTT: 29 seconds (ref 24–37)

## 2015-04-30 ENCOUNTER — Ambulatory Visit
Admission: RE | Admit: 2015-04-30 | Discharge: 2015-04-30 | Disposition: A | Discharge status: Home / Self Care | Payer: Medicare Other | Source: Ambulatory Visit | Attending: Otolaryngology | Admitting: Otolaryngology

## 2015-04-30 ENCOUNTER — Other Ambulatory Visit: Payer: Self-pay | Admitting: Otolaryngology

## 2015-04-30 DIAGNOSIS — Z01811 Encounter for preprocedural respiratory examination: Secondary | ICD-10-CM

## 2015-05-01 ENCOUNTER — Other Ambulatory Visit: Payer: Self-pay | Admitting: Emergency Medicine

## 2015-05-06 ENCOUNTER — Other Ambulatory Visit: Payer: Self-pay | Admitting: Otolaryngology

## 2015-08-02 ENCOUNTER — Other Ambulatory Visit: Payer: Self-pay | Admitting: Cardiology

## 2015-08-04 NOTE — Telephone Encounter (Signed)
Rx request sent to pharmacy.  

## 2015-09-19 ENCOUNTER — Telehealth: Payer: Self-pay | Admitting: Cardiology

## 2015-09-19 NOTE — Telephone Encounter (Signed)
Seabron Spates have to defer to [place].   Will need to look up.  Leonie Man, MD

## 2015-09-19 NOTE — Telephone Encounter (Signed)
Pt is calling in to speak with a nurse about a possible conflict with a new medication he just received. He wants to know if it is safe to take with the rest of his medications. Please f/u with him  Thanks

## 2015-09-19 NOTE — Telephone Encounter (Signed)
Spoke to patient Patient states his other physician would like for him to start Fluvoxamine Maleate 100 mg - (1/4 pill x 2 nights, then 1/2 tablet x 4 nights ,then whole tablet)  This medication is SSIR.  Patient states his pharmacy it maybe interaction with CLOPIDOGREL.   Patient wanted to know if he can take both medications.  Patient states he is bruising and bleeding easily on clopidogrel . RN informed patient that will happen will he is using clopidogrel  Aware will need to defer to pharmacist- Erasmo Downer and Dr Ellyn Hack. Patient would like answer as soon as possible -he is seeing the doctor today and will be out of town next week

## 2015-09-19 NOTE — Telephone Encounter (Signed)
Would not recommend this combination.  Will decrease effectiveness of clopidogrel.  Would prefer he try citalopram as is less likely to cause problem.  If he needs to use fluvoxamine, would highly recommend a P2Y12 test after 2 weeks to see if clopidogrel still effective

## 2015-09-19 NOTE — Telephone Encounter (Signed)
SPOKE TO PATIENT VERBALIZED UNDERSTANDING. HE WILL CALL WHEN HE MAKES A DECISION.

## 2015-10-07 ENCOUNTER — Other Ambulatory Visit: Payer: Self-pay | Admitting: Cardiology

## 2015-10-07 NOTE — Telephone Encounter (Signed)
Rx request sent to pharmacy.  

## 2015-10-16 ENCOUNTER — Telehealth: Payer: Self-pay | Admitting: Cardiology

## 2015-10-16 NOTE — Telephone Encounter (Signed)
Returned call. Pt spoke w/ Ivin Booty a month ago about antidepressants which would not interfere w/ his cardiac meds, and states he was following up to check on recommendations - wants to get put on something for antidepressant and spoke w. Dr. Launa Flight about this. Dr. Toy Cookey wanted to verify recommendations from [place], as he and pt had been advised of contraindications/precautions w/ fluvoxamine Advised pt that Celexa was recommended for fewer SE's. He voiced understanding - will check w/ Dr. Toy Cookey to see if this can be started, but patient notes he has tried several meds in past - unsure if this has already been tried.  Advised to call or have Dr. Toy Cookey call if we can give any further guidance on this. Pt voiced thanks and understanding.

## 2015-10-16 NOTE — Telephone Encounter (Signed)
Pt called in wanting to know if Dr. Ellyn Hack could recommend an anti depressant that will not conflict with him cardiac medications. Please f/u with pt  Thanks

## 2016-03-12 ENCOUNTER — Ambulatory Visit (INDEPENDENT_AMBULATORY_CARE_PROVIDER_SITE_OTHER): Payer: Federal, State, Local not specified - PPO | Admitting: Cardiology

## 2016-03-12 ENCOUNTER — Encounter: Payer: Self-pay | Admitting: *Deleted

## 2016-03-12 ENCOUNTER — Encounter: Payer: Self-pay | Admitting: Cardiology

## 2016-03-12 VITALS — BP 133/81 | HR 68 | Ht 69.5 in | Wt 153.2 lb

## 2016-03-12 DIAGNOSIS — E785 Hyperlipidemia, unspecified: Secondary | ICD-10-CM | POA: Diagnosis not present

## 2016-03-12 DIAGNOSIS — I2510 Atherosclerotic heart disease of native coronary artery without angina pectoris: Secondary | ICD-10-CM | POA: Diagnosis not present

## 2016-03-12 DIAGNOSIS — I10 Essential (primary) hypertension: Secondary | ICD-10-CM

## 2016-03-12 DIAGNOSIS — Z9861 Coronary angioplasty status: Secondary | ICD-10-CM | POA: Diagnosis not present

## 2016-03-12 NOTE — Patient Instructions (Addendum)
NO CHANGES WITH CURRENT MEDICATIONS   Your physician wants you to follow-up in: [place].-30 MIN You will receive a reminder letter in the mail two months in advance. If you don't receive a letter, please call our office to schedule the follow-up appointment.  If you need a refill on your cardiac medications before your next appointment, please call your pharmacy.
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
## 2016-03-18 ENCOUNTER — Encounter: Payer: Self-pay | Admitting: Cardiology

## 2016-03-18 NOTE — Assessment & Plan Note (Signed)
Well-controlled on Bystolic

## 2016-03-18 NOTE — Assessment & Plan Note (Addendum)
Remains off of statins due to memory loss issues. Is due to have labs checked by PCP. Last year's labs look great. He is taking omega-3 fatty acids.

## 2016-03-18 NOTE — Assessment & Plan Note (Signed)
No real typical anginal symptoms for him. He indicates he needs to get back into his routine as far as exercise and goes. He thinks he has lost a little bit of his endurance. Atypical chest discomfort symptoms which were not similar to his prior anginal episodes. No heart failure symptoms.  Plan: Continue Plavix without aspirin along with Bystolic. Not currently on statin secondary to memory loss issues.

## 2016-04-28 ENCOUNTER — Other Ambulatory Visit: Payer: Self-pay | Admitting: Cardiology

## 2016-06-11 ENCOUNTER — Other Ambulatory Visit: Payer: Self-pay

## 2016-06-11 MED ORDER — NEBIVOLOL HCL 5 MG PO TABS: ORAL_TABLET | ORAL | 0 refills | Status: DC

## 2016-06-11 NOTE — Telephone Encounter (Signed)
tried to call patient but unable to reach regarding 90 day supply of bystolic. will be mailing a card.

## 2017-02-15 ENCOUNTER — Other Ambulatory Visit: Payer: Self-pay | Admitting: Cardiology

## 2017-03-03 ENCOUNTER — Encounter: Payer: Self-pay | Admitting: Cardiology

## 2017-03-03 ENCOUNTER — Ambulatory Visit: Payer: Federal, State, Local not specified - PPO | Admitting: Cardiology

## 2017-03-03 ENCOUNTER — Ambulatory Visit (INDEPENDENT_AMBULATORY_CARE_PROVIDER_SITE_OTHER): Payer: Federal, State, Local not specified - PPO | Admitting: Cardiology

## 2017-03-03 VITALS — BP 102/68 | HR 63 | Ht 69.5 in | Wt 153.0 lb

## 2017-03-03 DIAGNOSIS — I10 Essential (primary) hypertension: Secondary | ICD-10-CM

## 2017-03-03 DIAGNOSIS — Z9861 Coronary angioplasty status: Secondary | ICD-10-CM

## 2017-03-03 DIAGNOSIS — I2510 Atherosclerotic heart disease of native coronary artery without angina pectoris: Secondary | ICD-10-CM

## 2017-03-03 DIAGNOSIS — E785 Hyperlipidemia, unspecified: Secondary | ICD-10-CM | POA: Diagnosis not present

## 2017-03-03 NOTE — Patient Instructions (Addendum)
  MEDICATION  WEAN OFF BYSTOLIC TAKE 1/2 TABLET EVERY OTHER DAY FOR 2 WEEKS THEN STOP TAKING MEDICATIONS    Your physician wants you to follow-up in 12 MONTHS WITH DR HARDING. You will receive a reminder letter in the mail two months in advance. If you don't receive a letter, please call our office to schedule the follow-up appointment.  If you need a refill on your cardiac medications before your next appointment, please call your pharmacy.
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
## 2017-03-05 ENCOUNTER — Encounter: Payer: Self-pay | Admitting: Cardiology

## 2017-03-05 NOTE — Assessment & Plan Note (Signed)
Distant history of CAD-PCI with no recurrent anginal symptoms. He remains  And beta blocker however he is starting to have orthostatic dizziness and a little bit of exercise fatigue. With this minimal dose of Bystolic she is still asymptomatic. Plan: Wean off Bystolic until it is off. - Not currently on statin due to memory issues but lipids are controlled with fish oil and CoQ10

## 2017-03-05 NOTE — Assessment & Plan Note (Signed)
Does not really have hypertension, in fact he is now having orthostatic hypotension.  Plan: Wean off Bystolic

## 2017-03-05 NOTE — Assessment & Plan Note (Signed)
Doing well off of statin altogether. Memory loss seemed to stabilize. He is on omega-3 fatty acids/fish oil and CoQ10. Last labs checked showed he still at goal.  Continue to monitor

## 2017-05-12 ENCOUNTER — Other Ambulatory Visit: Payer: Self-pay | Admitting: Cardiology

## 2017-06-12 ENCOUNTER — Other Ambulatory Visit: Payer: Self-pay | Admitting: Cardiology

## 2017-06-13 NOTE — Telephone Encounter (Signed)
REFILL 

## 2017-08-04 ENCOUNTER — Telehealth: Payer: Self-pay | Admitting: Cardiology

## 2017-08-04 NOTE — Telephone Encounter (Signed)
F/U   Patient diagnosed with jugular venous distension

## 2017-08-04 NOTE — Telephone Encounter (Signed)
Patient calling, states that he had a checkup with the [facility] and was instructed to communicate the following information:  Patient diagnosed with juggular Venous Dissention

## 2017-08-04 NOTE — Telephone Encounter (Signed)
Thanks for the follow-up -- If not having SSx of PND, orthopnea or edema - would continue current Rx & f/u as scheduled.  If having Sx - by all means, have him come on in. Glenetta Hew, MD

## 2017-08-04 NOTE — Telephone Encounter (Signed)
SPOKE TO PATIENT NO SYMPTOMS , AWARE IF ANY OCCUR CONTACT OFFICE OTHERWISE  KEEP APPOINTMENT FOR SEPT 2019 PATIENT VERBALIZED UNDERSTANDING

## 2017-08-04 NOTE — Telephone Encounter (Signed)
Patient reports he had a physical exam at the [place] a couple of weeks ago as he is trying to get established for [facility] health coverage.   He received a letter yesterday stating that his physical exam revealed the following findings: decreased exercise tolerance and JVD.  The letter states for him to let his cardiologist know.     Patient reports he is doing well, denies SOB, swelling/edema. States he has some abdominal "bloating" but states he has a lot of GI issues and contributes it to that.   States he walks a lot and has no issues with this, therefore doesn't think he has a decreased exercise tolerance or capability.       Patient just wanted to let Dr. Ellyn Hack know as he was instructed.  Advised I would route to Dr. Ellyn Hack to make aware.

## 2017-08-23 ENCOUNTER — Encounter: Payer: Self-pay | Admitting: Dietician

## 2017-08-23 ENCOUNTER — Encounter: Payer: Federal, State, Local not specified - PPO | Attending: Internal Medicine | Admitting: Dietician

## 2017-08-23 DIAGNOSIS — I2510 Atherosclerotic heart disease of native coronary artery without angina pectoris: Secondary | ICD-10-CM | POA: Diagnosis not present

## 2017-08-23 DIAGNOSIS — D519 Vitamin B12 deficiency anemia, unspecified: Secondary | ICD-10-CM | POA: Insufficient documentation

## 2017-08-23 DIAGNOSIS — D509 Iron deficiency anemia, unspecified: Secondary | ICD-10-CM | POA: Insufficient documentation

## 2017-08-23 DIAGNOSIS — Z713 Dietary counseling and surveillance: Secondary | ICD-10-CM | POA: Diagnosis not present

## 2017-08-23 DIAGNOSIS — Z903 Acquired absence of stomach [part of]: Secondary | ICD-10-CM | POA: Diagnosis not present

## 2017-08-23 DIAGNOSIS — Z955 Presence of coronary angioplasty implant and graft: Secondary | ICD-10-CM | POA: Diagnosis not present

## 2017-08-23 DIAGNOSIS — E119 Type 2 diabetes mellitus without complications: Secondary | ICD-10-CM | POA: Diagnosis not present

## 2017-08-23 DIAGNOSIS — Z8249 Family history of ischemic heart disease and other diseases of the circulatory system: Secondary | ICD-10-CM | POA: Diagnosis not present

## 2017-08-23 DIAGNOSIS — Z6822 Body mass index (BMI) 22.0-22.9, adult: Secondary | ICD-10-CM | POA: Diagnosis not present

## 2017-08-23 NOTE — Patient Instructions (Signed)
Stay hydrated (water, unsweetened caffeine free tea or other beverages without sugar or carbohydrates). Continue to stay active.  Low fat cooking, lean meats  Aim for 3 Carb Choices per meal (45 grams) +/- 1 either way  Aim for 0-1 Carbs per snack if hungry  Include protein in moderation with your meals and snacks Consider reading food labels for Total Carbohydrate and Fat Grams of foods Consider checking BG at alternate times per day as directed by MD   Resources:  Diabetes.org
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
## 2017-08-30 ENCOUNTER — Other Ambulatory Visit: Payer: Self-pay | Admitting: Cardiology

## 2017-08-30 MED ORDER — NITROGLYCERIN 0.4 MG SL SUBL: 0.4000 mg | SUBLINGUAL_TABLET | SUBLINGUAL | 5 refills | Status: DC | PRN

## 2017-08-30 NOTE — Telephone Encounter (Signed)
°*  STAT* If patient is at the pharmacy, call can be transferred to refill team.   1. Which medications need to be refilled? (please list name of each medication and dose if known)Nitroglycerin   2. Which pharmacy/location (including street and city if local pharmacy) is medication to be sent to?[facility] on [address]   3. Do they need a 30 day or 90 day supply? [place]

## 2017-09-01 ENCOUNTER — Encounter: Payer: Federal, State, Local not specified - PPO | Admitting: Dietician

## 2017-09-01 DIAGNOSIS — Z713 Dietary counseling and surveillance: Secondary | ICD-10-CM | POA: Diagnosis not present

## 2017-09-01 DIAGNOSIS — E119 Type 2 diabetes mellitus without complications: Secondary | ICD-10-CM

## 2017-09-01 NOTE — Patient Instructions (Addendum)
ALA (Alpha Lipoic Acid).  Some say that this can help with neuropathy. Consider decreasing your bread intake. Try to have 2 small meals earlier in the day (such as 10am and 2pm) Be mindful to keep your fat intake lower. Find ways to add vegetables to most meals as tolerated. Great job on avoiding the concentrating sources of sugar and drinking unsweetened beverages.  Stay hydrated (water, unsweetened caffeine free tea or other beverages without sugar or carbohydrates). Continue to stay active.  Low fat cooking, lean meats  Aim for 3 Carb Choices per meal (45 grams) +/- 1 either way  Aim for 0-1 Carbs per snack if hungry  Include protein in moderation with your meals and snacks Consider reading food labels for Total Carbohydrate and Fat Grams of foods Consider checking BG at alternate times per day as directed by MD   Resources:  Diabetes.org, Diabetes Food Hub, Magazines:  Diabetes Forecast or Diabetes Self Magazines from the ADA

## 2017-09-02 ENCOUNTER — Encounter: Payer: Self-pay | Admitting: Dietician
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
## 2017-09-28 ENCOUNTER — Encounter: Payer: Self-pay | Admitting: Neurology

## 2017-09-28 ENCOUNTER — Ambulatory Visit: Payer: Federal, State, Local not specified - PPO | Admitting: Neurology

## 2017-09-28 VITALS — BP 119/70 | HR 83 | Ht 69.5 in | Wt 158.5 lb

## 2017-09-28 DIAGNOSIS — R202 Paresthesia of skin: Secondary | ICD-10-CM | POA: Diagnosis not present
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
## 2017-10-03 LAB — MULTIPLE MYELOMA PANEL, SERUM
ALBUMIN SERPL ELPH-MCNC: 3.9 g/dL (ref 2.9–4.4)
ALPHA 1: 0.2 g/dL (ref 0.0–0.4)
ALPHA2 GLOB SERPL ELPH-MCNC: 0.6 g/dL (ref 0.4–1.0)
Albumin/Glob SerPl: 1.6 (ref 0.7–1.7)
B-Globulin SerPl Elph-Mcnc: 1 g/dL (ref 0.7–1.3)
Gamma Glob SerPl Elph-Mcnc: 0.9 g/dL (ref 0.4–1.8)
Globulin, Total: 2.6 g/dL (ref 2.2–3.9)
IGA/IMMUNOGLOBULIN A, SERUM: 79 mg/dL (ref 61–437)
IGG (IMMUNOGLOBIN G), SERUM: 814 mg/dL (ref 700–1600)
IgM (Immunoglobulin M), Srm: 75 mg/dL (ref 15–143)
TOTAL PROTEIN: 6.5 g/dL (ref 6.0–8.5)

## 2017-10-03 LAB — ANA W/REFLEX: Anti Nuclear Antibody(ANA): NEGATIVE

## 2017-10-03 LAB — SEDIMENTATION RATE: Sed Rate: 2 mm/hr (ref 0–30)

## 2017-10-03 LAB — COPPER, SERUM: Copper: 99 ug/dL (ref 72–166)

## 2017-10-03 LAB — B. BURGDORFI ANTIBODIES

## 2017-10-03 LAB — ANGIOTENSIN CONVERTING ENZYME: Angio Convert Enzyme: 56 U/L (ref 14–82)

## 2017-10-04 ENCOUNTER — Telehealth: Payer: Self-pay | Admitting: *Deleted

## 2017-10-04 NOTE — Telephone Encounter (Signed)
-----   Message from Kathrynn Ducking, MD sent at 10/03/2017  4:37 PM EDT -----  I have read the note, and I agree with the clinical assessment and plan.  Kathrynn Ducking  ----- Message ----- From: Buel Ream, [facility] Lab Results In Sent: 09/29/2017   7:43 AM To: Kathrynn Ducking, MD

## 2017-10-04 NOTE — Telephone Encounter (Signed)
Called and spoke with patient about unremarkable labs per CW,MD. Reminded him of EMG/NCS appt on 10/31/17 at 245pm. He verbalized understanding and appreciation for call.

## 2017-10-31 ENCOUNTER — Ambulatory Visit (INDEPENDENT_AMBULATORY_CARE_PROVIDER_SITE_OTHER): Payer: Federal, State, Local not specified - PPO | Admitting: Neurology

## 2017-10-31 ENCOUNTER — Encounter: Payer: Self-pay | Admitting: Neurology

## 2017-10-31 DIAGNOSIS — E1142 Type 2 diabetes mellitus with diabetic polyneuropathy: Secondary | ICD-10-CM

## 2017-10-31 DIAGNOSIS — R202 Paresthesia of skin: Secondary | ICD-10-CM | POA: Diagnosis not present

## 2017-10-31 HISTORY — DX: Type 2 diabetes mellitus with diabetic polyneuropathy: E11.42

## 2017-10-31 NOTE — Progress Notes (Signed)
Please refer to EMG and nerve conduction study procedure note. 

## 2017-10-31 NOTE — Procedures (Signed)
     HISTORY:  Jacob Dalton is a 72 year old gentleman with a history of borderline diabetes and onset of numbness in both feet.  The patient occasionally will have lancinating pains in the feet, but in general he has very little discomfort.  He is being evaluated for a possible neuropathy or a lumbosacral radiculopathy.  NERVE CONDUCTION STUDIES:  Nerve conduction studies were performed on both lower extremities.  The distal latencies for the peroneal nerves were prolonged on the right, normal on the left, with a low motor amplitude on the right, normal on the left.  The distal motor latencies and motor amplitudes for the posterior tibial nerves were normal bilaterally.  Slowing was seen for the peroneal and posterior tibial nerves bilaterally.  The sural sensory latencies were normal on the right, prolonged on the left, and absent peroneal sensory latencies were noted bilaterally.  The posterior tibial F wave latencies were prolonged bilaterally.  EMG STUDIES:  EMG study was performed on the left lower extremity:  The tibialis anterior muscle reveals 2 to 4K motor units with full recruitment. No fibrillations or positive waves were seen. The peroneus tertius muscle reveals 2 to 5K motor units with decreased recruitment. No fibrillations or positive waves were seen. The medial gastrocnemius muscle reveals 1 to 3K motor units with full recruitment. No fibrillations or positive waves were seen. The vastus lateralis muscle reveals 2 to 4K motor units with full recruitment. No fibrillations or positive waves were seen. The iliopsoas muscle reveals 2 to 4K motor units with full recruitment. No fibrillations or positive waves were seen. The biceps femoris muscle (long head) reveals 2 to 4K motor units with full recruitment. No fibrillations or positive waves were seen. The lumbosacral paraspinal muscles were tested at 3 levels, and revealed no abnormalities of insertional activity at all 3 levels  tested. There was good relaxation.   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  EMG evaluation of the left lower extremity shows mild chronic distal signs of denervation consistent with the diagnosis of peripheral neuropathy.  There is no evidence of an overlying lumbosacral radiculopathy.  Jill Alexanders MD 10/31/2017 4:03 PM  [facility] [address] [place], [place] [address]  Phone [phone] Fax [phone]

## 2017-11-01 ENCOUNTER — Telehealth: Payer: Self-pay

## 2017-11-01 NOTE — Telephone Encounter (Signed)
   [facility] Pre-operative Risk Assessment    Request for surgical clearance:  1. What type of surgery is being performed? Colonoscopy/endoscopy  2. When is this surgery scheduled? 11/07/17   3. What type of clearance is required (medical clearance vs. Pharmacy clearance to hold med vs. Both)? Both  4. Are there any medications that need to be held prior to surgery and how long? Plavix   5. Practice name and name of physician performing surgery? [facility]   6. What is your office phone number [phone]    7.   What is your office fax number [phone]  8.   Anesthesia type (None, local, MAC, general) ? Unknown   Meryl Crutch 11/01/2017, 11:15 AM  _________________________________________________________________   (provider comments below)

## 2017-11-01 NOTE — Telephone Encounter (Signed)
Okay to hold Plavix  Taeja Debellis, MD  

## 2017-11-01 NOTE — Telephone Encounter (Signed)
   Primary Cardiologist:David Ellyn Hack, MD  Chart reviewed as part of pre-operative protocol coverage.   Called patient at home.  He was recently dx with DM.  He has felt a little lethargic.  But, he denies chest pain, shortness of breath.   RCRI:  0.9% DASI:  5.19 METs Therefore, he is ok to proceed with his colonoscopy.  Preop Callback: Letter sent to GI.  Please call GI office to make sure fax was received.  Richardson Dopp, PA-C  11/01/2017, 5:17 PM

## 2017-11-01 NOTE — Telephone Encounter (Signed)
   Primary Cardiologist:David Ellyn Hack, MD  Will route to Dr. Ellyn Hack to make recommendations regarding +/- holding Plavix for colonoscopy.  Then, patient will need call from Preop APP to assess for any clinical change since last OV.   Chart reviewed as part of pre-operative protocol coverage.   72 y.o. male with hx of CAD s/p Cypher DES to the LAD in 2008.  He was last seen in 9/18.    Richardson Dopp, PA-C  11/01/2017, 3:09 PM

## 2017-11-01 NOTE — Progress Notes (Signed)
[facility]    Nerve / Sites Muscle Latency Ref. Amplitude Ref. Rel Amp Segments Distance Velocity Ref. Area    ms ms mV mV %  cm m/s m/s mVms  R Peroneal - EDB     Ankle EDB 8.3 ?6.5 0.1 ?2.0 100 Ankle - EDB 9   0.2     Fib head EDB 17.6  0.1  50.6 Fib head - Ankle 31 33 ?44      Pop fossa EDB 20.6  0.2  350 Pop fossa - Fib head 10 33 ?44 0.5         Pop fossa - Ankle      L Peroneal - EDB     Ankle EDB 5.1 ?6.5 3.6 ?2.0 100 Ankle - EDB 9   11.0     Fib head EDB 12.6  3.4  93.1 Fib head - Ankle 31 41 ?44 11.2     Pop fossa EDB 15.3  3.3  96.7 Pop fossa - Fib head 10 38 ?44 11.0         Pop fossa - Ankle      R Tibial - AH     Ankle AH 4.1 ?5.8 8.2 ?4.0 100 Ankle - AH 9   22.7     Pop fossa AH 13.6  5.3  64.9 Pop fossa - Ankle 37 39 ?41 17.5  L Tibial - AH     Ankle AH 4.5 ?5.8 8.3 ?4.0 100 Ankle - AH 9   23.3     Pop fossa AH 14.1  5.9  71.2 Pop fossa - Ankle 37 39 ?41 18.1             SNC    Nerve / Sites Rec. Site Peak Lat Ref.  Amp Ref. Segments Distance    ms ms V V  cm  R Sural - Ankle (Calf)     Calf Ankle 4.3 ?4.4 3 ?6 Calf - Ankle 14  L Sural - Ankle (Calf)     Calf Ankle 4.9 ?4.4 4 ?6 Calf - Ankle 14  L Superficial peroneal - Ankle     Lat leg Ankle NR ?4.4 NR ?6 Lat leg - Ankle 14  R Superficial peroneal - Ankle     Lat leg Ankle NR ?4.4 NR ?6 Lat leg - Ankle 14              F  Wave    Nerve F Lat Ref.   ms ms  R Tibial - AH 60.3 ?56.0  L Tibial - AH 59.3 ?56.0

## 2017-11-02 NOTE — Telephone Encounter (Signed)
Re-faxed cardiac clearance and call office and secretary stated the letter was received.

## 2017-12-20 DIAGNOSIS — D509 Iron deficiency anemia, unspecified: Secondary | ICD-10-CM | POA: Diagnosis not present

## 2017-12-30 ENCOUNTER — Telehealth: Payer: Self-pay | Admitting: *Deleted

## 2017-12-30 NOTE — Telephone Encounter (Signed)
Pt called with question- When was I last treated and name of medication. Gave pt all information, advised he will need new referral to be seen as it has been 6 years since last follow up

## 2018-01-12 ENCOUNTER — Other Ambulatory Visit (HOSPITAL_COMMUNITY): Payer: Self-pay

## 2018-01-13 ENCOUNTER — Ambulatory Visit (HOSPITAL_COMMUNITY)
Admission: RE | Admit: 2018-01-13 | Discharge: 2018-01-13 | Disposition: A | Discharge status: Home / Self Care | Payer: Medicare Other | Source: Ambulatory Visit | Attending: Internal Medicine | Admitting: Internal Medicine

## 2018-01-13 DIAGNOSIS — D509 Iron deficiency anemia, unspecified: Secondary | ICD-10-CM | POA: Diagnosis not present

## 2018-01-13 MED ORDER — ACETAMINOPHEN 500 MG PO TABS
ORAL_TABLET | ORAL | Status: AC
  Administered 2018-01-13: 500 mg via ORAL
  Filled 2018-01-13: qty 1

## 2018-01-13 MED ORDER — SODIUM CHLORIDE 0.9 % IV SOLN
100.0000 mg | Freq: Once | INTRAVENOUS | Status: AC
  Administered 2018-01-13: 100 mg via INTRAVENOUS
  Filled 2018-01-13: qty 2

## 2018-01-13 MED ORDER — ACETAMINOPHEN 500 MG PO TABS
500.0000 mg | ORAL_TABLET | Freq: Once | ORAL | Status: AC
  Administered 2018-01-13: 500 mg via ORAL

## 2018-01-19 DIAGNOSIS — E1165 Type 2 diabetes mellitus with hyperglycemia: Secondary | ICD-10-CM | POA: Diagnosis not present

## 2018-01-19 DIAGNOSIS — E611 Iron deficiency: Secondary | ICD-10-CM | POA: Diagnosis not present

## 2018-01-19 DIAGNOSIS — E119 Type 2 diabetes mellitus without complications: Secondary | ICD-10-CM | POA: Diagnosis not present

## 2018-01-30 ENCOUNTER — Ambulatory Visit (HOSPITAL_COMMUNITY)
Admission: RE | Admit: 2018-01-30 | Discharge: 2018-01-30 | Disposition: A | Discharge status: Home / Self Care | Payer: Medicare Other | Source: Ambulatory Visit | Attending: Internal Medicine | Admitting: Internal Medicine

## 2018-01-30 DIAGNOSIS — D509 Iron deficiency anemia, unspecified: Secondary | ICD-10-CM | POA: Diagnosis not present

## 2018-01-30 MED ORDER — SODIUM CHLORIDE 0.9 % IV SOLN: 700.0000 mg | Freq: Once | INTRAVENOUS | Status: DC

## 2018-01-30 MED ORDER — ACETAMINOPHEN 500 MG PO TABS: 500.0000 mg | ORAL_TABLET | Freq: Once | ORAL | Status: DC

## 2018-01-30 MED ORDER — SODIUM CHLORIDE 0.9 % IV SOLN
300.0000 mg | Freq: Once | INTRAVENOUS | Status: AC
  Administered 2018-01-30: 300 mg via INTRAVENOUS
  Filled 2018-01-30: qty 6

## 2018-01-30 MED ORDER — SODIUM CHLORIDE 0.9 % IV SOLN
700.0000 mg | Freq: Once | INTRAVENOUS | Status: AC
  Administered 2018-01-30: 700 mg via INTRAVENOUS
  Filled 2018-01-30: qty 14

## 2018-03-20 DIAGNOSIS — M65311 Trigger thumb, right thumb: Secondary | ICD-10-CM | POA: Diagnosis not present

## 2018-03-21 DIAGNOSIS — Z7984 Long term (current) use of oral hypoglycemic drugs: Secondary | ICD-10-CM | POA: Diagnosis not present

## 2018-03-21 DIAGNOSIS — H25813 Combined forms of age-related cataract, bilateral: Secondary | ICD-10-CM | POA: Diagnosis not present

## 2018-03-21 DIAGNOSIS — H43811 Vitreous degeneration, right eye: Secondary | ICD-10-CM | POA: Diagnosis not present

## 2018-03-21 DIAGNOSIS — Z79899 Other long term (current) drug therapy: Secondary | ICD-10-CM | POA: Diagnosis not present

## 2018-03-21 DIAGNOSIS — H02834 Dermatochalasis of left upper eyelid: Secondary | ICD-10-CM | POA: Diagnosis not present

## 2018-03-21 DIAGNOSIS — E1136 Type 2 diabetes mellitus with diabetic cataract: Secondary | ICD-10-CM | POA: Diagnosis not present

## 2018-03-21 DIAGNOSIS — H3540 Unspecified peripheral retinal degeneration: Secondary | ICD-10-CM | POA: Diagnosis not present

## 2018-03-21 DIAGNOSIS — H02831 Dermatochalasis of right upper eyelid: Secondary | ICD-10-CM | POA: Diagnosis not present

## 2018-04-18 DIAGNOSIS — M65311 Trigger thumb, right thumb: Secondary | ICD-10-CM | POA: Diagnosis not present

## 2018-04-20 DIAGNOSIS — L821 Other seborrheic keratosis: Secondary | ICD-10-CM | POA: Diagnosis not present

## 2018-04-20 DIAGNOSIS — D0422 Carcinoma in situ of skin of left ear and external auricular canal: Secondary | ICD-10-CM | POA: Diagnosis not present

## 2018-04-20 DIAGNOSIS — Z85828 Personal history of other malignant neoplasm of skin: Secondary | ICD-10-CM | POA: Diagnosis not present

## 2018-04-20 DIAGNOSIS — D485 Neoplasm of uncertain behavior of skin: Secondary | ICD-10-CM | POA: Diagnosis not present

## 2018-04-20 DIAGNOSIS — L218 Other seborrheic dermatitis: Secondary | ICD-10-CM | POA: Diagnosis not present

## 2018-04-20 DIAGNOSIS — D692 Other nonthrombocytopenic purpura: Secondary | ICD-10-CM | POA: Diagnosis not present

## 2018-06-23 ENCOUNTER — Other Ambulatory Visit: Payer: Self-pay | Admitting: Cardiology

## 2018-07-24 DIAGNOSIS — L820 Inflamed seborrheic keratosis: Secondary | ICD-10-CM | POA: Diagnosis not present

## 2018-07-24 DIAGNOSIS — L570 Actinic keratosis: Secondary | ICD-10-CM | POA: Diagnosis not present

## 2018-07-24 DIAGNOSIS — L821 Other seborrheic keratosis: Secondary | ICD-10-CM | POA: Diagnosis not present

## 2018-07-24 DIAGNOSIS — Z85828 Personal history of other malignant neoplasm of skin: Secondary | ICD-10-CM | POA: Diagnosis not present

## 2018-10-07 ENCOUNTER — Other Ambulatory Visit: Payer: Self-pay | Admitting: Cardiology

## 2018-10-09 NOTE — Telephone Encounter (Signed)
Clopidogrel refilled

## 2018-11-20 ENCOUNTER — Telehealth: Payer: Self-pay | Admitting: Cardiology

## 2018-11-20 NOTE — Telephone Encounter (Signed)
Pt called and states he is feeling more lethargic than normal. He states he has a blood issue, but is not sure if his lethargy is cardiac related or not.

## 2018-11-20 NOTE — Telephone Encounter (Signed)
Not really sure what to make of his complaints because he is very active. If he is having some fatigue issues, I do not think that starting a statin at this time is the right choice.  Maybe wait a month or so and see how he is feeling.  Then try starting it every other day to see how he does.  Crestor is less likely to cause side effects.  Glenetta Hew, MD

## 2018-11-20 NOTE — Telephone Encounter (Addendum)
Returned call to patient of Dr. Ellyn Hack. His last visit was 02/2017. He reports he sees providers at the [place] and Dr. Inda Merlin on June 19 for physical. He reports feeling more lethargic and his feet feel heavy.   Health updates since last visit: -- He reports walking 2-2.5 miles every day on average. He reports less energy than he has had in the past and going up hill is harder.  -- He reports he also has a "blood problem" in the past, he has had to have iron infusions in the past for this - his Hgb was 15.4 on last labs from [place]. He also has diabetes, which is new diagnosis in last 2-3 years - his A1c was 6.0. He had been on metformin but stopped this as he felt it was not working for him. -- He had an appointment with [facility] last week and they recommended crestor. He previously took cholesterol medication (atorvastatin, simvastatin) in the past, states he was taken off this and started on co-q10 - per allergy list caused cramping & memory issues.    He denies chest pain & SOB.   Advised patient would route to MD to review and advise on symptoms, concerns. Does he need an appointment - virtual or in-office? Does he need any testing or does PCP need to order any specific tests?  He was offered a virtual visit this week

## 2018-11-21 NOTE — Telephone Encounter (Signed)
Spoke with patient and provided MD advice. He voiced understanding. Informed him that he should f/up on an annual basis for routine cardiology care and he prefers in-office appointment so I messaged scheduling pool. He asked if PCP should do any specific labs and I notified him that a lipid panel & metabolic panel is sufficient, which are generally done during annual visits with PCP. No further assistance needed at this time.

## 2019-01-04 ENCOUNTER — Other Ambulatory Visit: Payer: Self-pay | Admitting: Cardiology

## 2019-03-01 DIAGNOSIS — R3915 Urgency of urination: Secondary | ICD-10-CM | POA: Diagnosis not present

## 2019-03-01 DIAGNOSIS — E782 Mixed hyperlipidemia: Secondary | ICD-10-CM | POA: Diagnosis not present

## 2019-03-01 DIAGNOSIS — I2510 Atherosclerotic heart disease of native coronary artery without angina pectoris: Secondary | ICD-10-CM | POA: Diagnosis not present

## 2019-03-01 DIAGNOSIS — E559 Vitamin D deficiency, unspecified: Secondary | ICD-10-CM | POA: Diagnosis not present

## 2019-03-01 DIAGNOSIS — F324 Major depressive disorder, single episode, in partial remission: Secondary | ICD-10-CM | POA: Diagnosis not present

## 2019-03-01 DIAGNOSIS — E119 Type 2 diabetes mellitus without complications: Secondary | ICD-10-CM | POA: Diagnosis not present

## 2019-03-01 DIAGNOSIS — G629 Polyneuropathy, unspecified: Secondary | ICD-10-CM | POA: Diagnosis not present

## 2019-03-01 DIAGNOSIS — R209 Unspecified disturbances of skin sensation: Secondary | ICD-10-CM | POA: Diagnosis not present

## 2019-03-01 DIAGNOSIS — E611 Iron deficiency: Secondary | ICD-10-CM | POA: Diagnosis not present

## 2019-03-01 DIAGNOSIS — Z0001 Encounter for general adult medical examination with abnormal findings: Secondary | ICD-10-CM | POA: Diagnosis not present

## 2019-03-01 DIAGNOSIS — Z125 Encounter for screening for malignant neoplasm of prostate: Secondary | ICD-10-CM | POA: Diagnosis not present

## 2019-03-01 DIAGNOSIS — Z23 Encounter for immunization: Secondary | ICD-10-CM | POA: Diagnosis not present

## 2019-03-07 ENCOUNTER — Telehealth: Payer: Self-pay | Admitting: *Deleted

## 2019-03-07 NOTE — Telephone Encounter (Signed)
   TELEPHONE CALL NOTE  This patient has been deemed a candidate for follow-up tele-health visit to limit community exposure during the Covid-19 pandemic. I spoke with the patient via phone to discuss instructions.  A Virtual Office Visit appointment type has been scheduled for 9/17 with [facility], with "VIDEO/TEXT"   I have confirmed the patient is active in [place], Tysheena Ginzburg V, [place] 03/07/2019 9:52 AM

## 2019-03-08 ENCOUNTER — Telehealth (INDEPENDENT_AMBULATORY_CARE_PROVIDER_SITE_OTHER): Payer: Medicare Other | Admitting: Cardiology

## 2019-03-08 ENCOUNTER — Encounter: Payer: Self-pay | Admitting: Cardiology

## 2019-03-08 VITALS — HR 87 | Ht 69.0 in | Wt 152.0 lb

## 2019-03-08 DIAGNOSIS — E785 Hyperlipidemia, unspecified: Secondary | ICD-10-CM | POA: Diagnosis not present

## 2019-03-08 DIAGNOSIS — I2510 Atherosclerotic heart disease of native coronary artery without angina pectoris: Secondary | ICD-10-CM

## 2019-03-08 DIAGNOSIS — I10 Essential (primary) hypertension: Secondary | ICD-10-CM

## 2019-03-08 DIAGNOSIS — Z9861 Coronary angioplasty status: Secondary | ICD-10-CM

## 2019-03-08 DIAGNOSIS — E1142 Type 2 diabetes mellitus with diabetic polyneuropathy: Secondary | ICD-10-CM

## 2019-03-08 DIAGNOSIS — R5383 Other fatigue: Secondary | ICD-10-CM
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
## 2019-03-08 NOTE — Patient Instructions (Addendum)
Medication Instructions:  No changes  If you need a refill on your cardiac medications before your next appointment, please call your pharmacy.   Lab work: none  Testing/Procedures: No need for new tests -- Fatigue is not usually a Heart Artery Diseae issue.  Follow-Up: At [facility], you and your health needs are our priority.  As part of our continuing mission to provide you with exceptional heart care, we have created designated Provider Care Teams.  These Care Teams include your primary Cardiologist (physician) and Advanced Practice Providers (APPs -  Physician Assistants and Nurse Practitioners) who all work together to provide you with the care you need, when you need it. . You will need a follow up appointment in 12  Months -SEPT 2021.  Please call our office 2 months in advance to schedule this appointment.  You may see Glenetta Hew, MD or one of the following Advanced Practice Providers on your designated Care Team:   . Rosaria Ferries, PA-C . Jory Sims, DNP, ANP  Any Other Special Instructions Will Be Listed Below .

## 2019-03-11 DIAGNOSIS — R5383 Other fatigue: Secondary | ICD-10-CM | POA: Insufficient documentation

## 2019-03-11 NOTE — Assessment & Plan Note (Signed)
Distant history of PCI with no recurrent anginal symptoms rec is Jacob Dalton.  He has fatigue and has been on low-dose beta-blocker that is now stopped.  We wean that off as of 2018.  Now is on clopidogrel and low-dose rosuvastatin is the only cardiac medication besides co-Q10 and fish oil.  Would be fine to hold clopidogrel for any procedures as it is now being used for secondary prevention.  In lieu of his ongoing fatigue issues, I will defer management of lipids and blood pressure to his PCP since I do not have any recent labs.  I do not think that his fatigue is anything to do with any cardiac issues and he is not having any anginal or heart failure symptoms.  No further testing at this time.

## 2019-03-11 NOTE — Assessment & Plan Note (Signed)
He tells me his blood pressures been running fairly well at this point various other medical evaluation visits.  Blood pressure never really been an issue for him and we weaned off his Bystolic last him I saw him 2 years ago.  That was for issues of fatigue and orthostatic hypotension.  While the orthostatic symptoms seem to have resolved, the fatigue has not.  Would not restart beta-blocker, I would potentially allow for some mild permissive hypertension to avoid any exacerbation of fatigue.

## 2019-03-11 NOTE — Assessment & Plan Note (Signed)
He is now back on low-dose statin plus MAG3 had ASA control Q10.  Labs followed by PCP.  I do not have recent labs available.  Hopefully with some weight loss and continued exercise he will be able maintain stable lipid level.

## 2019-03-11 NOTE — Assessment & Plan Note (Signed)
Sure what to make of his fatigue symptoms.  He just describes feeling lethargic with no drive to do things.  But despite this he is able to walk routinely about 2-1/2-3 and half miles a day without any chest pain or pressure.  No dyspnea.  No heart failure symptoms of PND orthopnea.  I have a hard time believing the fatigue is a cardiac issue.  He is not on any medicines besides very low-dose statin which is unlikely to be causing the symptoms since this is a long-term medication.  A premature not on any other cardiac medications such as beta blockers reviewed hypertensive that would affect energy level.

## 2019-03-11 NOTE — Assessment & Plan Note (Signed)
Seems to be limiting his walking.  Defer to PCP

## 2019-03-17 ENCOUNTER — Other Ambulatory Visit: Payer: Self-pay | Admitting: Cardiology

## 2019-03-19 DIAGNOSIS — H25813 Combined forms of age-related cataract, bilateral: Secondary | ICD-10-CM | POA: Diagnosis not present

## 2019-04-05 ENCOUNTER — Other Ambulatory Visit: Payer: Self-pay | Admitting: Cardiology

## 2019-04-16 DIAGNOSIS — H25813 Combined forms of age-related cataract, bilateral: Secondary | ICD-10-CM | POA: Diagnosis not present

## 2019-04-16 DIAGNOSIS — Z20828 Contact with and (suspected) exposure to other viral communicable diseases: Secondary | ICD-10-CM | POA: Diagnosis not present

## 2019-04-16 DIAGNOSIS — Z01818 Encounter for other preprocedural examination: Secondary | ICD-10-CM | POA: Diagnosis not present

## 2019-04-17 DIAGNOSIS — H25813 Combined forms of age-related cataract, bilateral: Secondary | ICD-10-CM | POA: Diagnosis not present

## 2019-04-19 DIAGNOSIS — I10 Essential (primary) hypertension: Secondary | ICD-10-CM | POA: Diagnosis not present

## 2019-04-19 DIAGNOSIS — H25812 Combined forms of age-related cataract, left eye: Secondary | ICD-10-CM | POA: Diagnosis not present

## 2019-04-20 DIAGNOSIS — Z79899 Other long term (current) drug therapy: Secondary | ICD-10-CM | POA: Diagnosis not present

## 2019-04-20 DIAGNOSIS — H43811 Vitreous degeneration, right eye: Secondary | ICD-10-CM | POA: Diagnosis not present

## 2019-04-20 DIAGNOSIS — M545 Low back pain: Secondary | ICD-10-CM | POA: Diagnosis not present

## 2019-04-20 DIAGNOSIS — Z7952 Long term (current) use of systemic steroids: Secondary | ICD-10-CM | POA: Diagnosis not present

## 2019-04-20 DIAGNOSIS — M533 Sacrococcygeal disorders, not elsewhere classified: Secondary | ICD-10-CM | POA: Diagnosis not present

## 2019-04-20 DIAGNOSIS — Z7984 Long term (current) use of oral hypoglycemic drugs: Secondary | ICD-10-CM | POA: Diagnosis not present

## 2019-04-20 DIAGNOSIS — H2511 Age-related nuclear cataract, right eye: Secondary | ICD-10-CM | POA: Diagnosis not present

## 2019-04-20 DIAGNOSIS — Z48810 Encounter for surgical aftercare following surgery on the sense organs: Secondary | ICD-10-CM | POA: Diagnosis not present

## 2019-04-20 DIAGNOSIS — E119 Type 2 diabetes mellitus without complications: Secondary | ICD-10-CM | POA: Diagnosis not present

## 2019-04-26 DIAGNOSIS — L814 Other melanin hyperpigmentation: Secondary | ICD-10-CM | POA: Diagnosis not present

## 2019-04-26 DIAGNOSIS — L281 Prurigo nodularis: Secondary | ICD-10-CM | POA: Diagnosis not present

## 2019-04-26 DIAGNOSIS — D1801 Hemangioma of skin and subcutaneous tissue: Secondary | ICD-10-CM | POA: Diagnosis not present

## 2019-04-26 DIAGNOSIS — Z85828 Personal history of other malignant neoplasm of skin: Secondary | ICD-10-CM | POA: Diagnosis not present

## 2019-04-26 DIAGNOSIS — L821 Other seborrheic keratosis: Secondary | ICD-10-CM | POA: Diagnosis not present

## 2019-04-26 DIAGNOSIS — L570 Actinic keratosis: Secondary | ICD-10-CM | POA: Diagnosis not present

## 2019-06-28 ENCOUNTER — Other Ambulatory Visit: Payer: Self-pay | Admitting: Cardiology

## 2019-07-06 ENCOUNTER — Ambulatory Visit: Payer: Medicare Other | Attending: Internal Medicine

## 2019-07-06 DIAGNOSIS — Z23 Encounter for immunization: Secondary | ICD-10-CM | POA: Diagnosis not present

## 2019-07-06 NOTE — Progress Notes (Signed)
   Covid-19 Vaccination Clinic  Name:  KODEN LEEDER    MRN: [id number] DOB: 07-Jun-1946  07/06/2019  Mr. Kiebler was observed post Covid-19 immunization for 30 minutes based on pre-vaccination screening without incidence. He was provided with Vaccine Information Sheet and instruction to access the V-Safe system.   Mr. Ivie was instructed to call 911 with any severe reactions post vaccine: Marland Kitchen Difficulty breathing  . Swelling of your face and throat  . A fast heartbeat  . A bad rash all over your body  . Dizziness and weakness    Immunizations Administered    Name Date Dose VIS Date Route   Pfizer COVID-19 Vaccine 07/06/2019 11:30 AM 0.3 mL 06/01/2019 Intramuscular   Manufacturer: [employer], [employer]   Lot: [id number]   [place]: [id number]

## 2019-07-26 ENCOUNTER — Ambulatory Visit: Payer: Medicare Other | Attending: Internal Medicine

## 2019-07-26 DIAGNOSIS — Z23 Encounter for immunization: Secondary | ICD-10-CM | POA: Insufficient documentation

## 2019-07-26 NOTE — Progress Notes (Signed)
   Covid-19 Vaccination Clinic  Name:  Jacob Dalton    MRN: [id number] DOB: 01-10-1946  07/26/2019  [name] was observed post Covid-19 immunization for  15 minutes without incidence. He was provided with Vaccine Information Sheet and instruction to access the V-Safe system.   [name] was instructed to call 911 with any severe reactions post vaccine: Marland Kitchen Difficulty breathing  . Swelling of your face and throat  . A fast heartbeat  . A bad rash all over your body  . Dizziness and weakness    Immunizations Administered    Name Date Dose VIS Date Route   Pfizer COVID-19 Vaccine 07/26/2019 12:51 PM 0.3 mL 06/01/2019 Intramuscular   Manufacturer: [place]   Lot: [id number]   [place]: [id number]

## 2019-09-04 DIAGNOSIS — M545 Low back pain: Secondary | ICD-10-CM | POA: Diagnosis not present

## 2019-09-04 DIAGNOSIS — M5417 Radiculopathy, lumbosacral region: Secondary | ICD-10-CM | POA: Diagnosis not present

## 2019-09-18 DIAGNOSIS — E782 Mixed hyperlipidemia: Secondary | ICD-10-CM | POA: Diagnosis not present

## 2019-09-18 DIAGNOSIS — E119 Type 2 diabetes mellitus without complications: Secondary | ICD-10-CM | POA: Diagnosis not present

## 2019-09-18 DIAGNOSIS — Z79899 Other long term (current) drug therapy: Secondary | ICD-10-CM | POA: Diagnosis not present

## 2019-09-18 DIAGNOSIS — J029 Acute pharyngitis, unspecified: Secondary | ICD-10-CM | POA: Diagnosis not present

## 2019-09-18 DIAGNOSIS — R5383 Other fatigue: Secondary | ICD-10-CM | POA: Diagnosis not present

## 2019-09-18 DIAGNOSIS — E611 Iron deficiency: Secondary | ICD-10-CM | POA: Diagnosis not present

## 2019-09-28 ENCOUNTER — Other Ambulatory Visit: Payer: Self-pay | Admitting: Orthopedic Surgery

## 2019-09-28 DIAGNOSIS — M48062 Spinal stenosis, lumbar region with neurogenic claudication: Secondary | ICD-10-CM

## 2019-09-28 DIAGNOSIS — M545 Low back pain: Secondary | ICD-10-CM | POA: Diagnosis not present

## 2019-10-04 ENCOUNTER — Other Ambulatory Visit: Payer: Self-pay | Admitting: Orthopedic Surgery

## 2019-10-04 ENCOUNTER — Other Ambulatory Visit (HOSPITAL_COMMUNITY): Payer: Self-pay

## 2019-10-04 DIAGNOSIS — M48062 Spinal stenosis, lumbar region with neurogenic claudication: Secondary | ICD-10-CM

## 2019-10-05 ENCOUNTER — Other Ambulatory Visit: Payer: Self-pay

## 2019-10-05 ENCOUNTER — Ambulatory Visit (HOSPITAL_COMMUNITY)
Admission: RE | Admit: 2019-10-05 | Discharge: 2019-10-05 | Disposition: A | Discharge status: Home / Self Care | Payer: Medicare Other | Source: Ambulatory Visit | Attending: Internal Medicine | Admitting: Internal Medicine

## 2019-10-05 DIAGNOSIS — D509 Iron deficiency anemia, unspecified: Secondary | ICD-10-CM | POA: Insufficient documentation

## 2019-10-05 MED ORDER — ACETAMINOPHEN 500 MG PO TABS
ORAL_TABLET | ORAL | Status: AC
  Filled 2019-10-05: qty 1

## 2019-10-05 MED ORDER — ACETAMINOPHEN 500 MG PO TABS
500.0000 mg | ORAL_TABLET | Freq: Once | ORAL | Status: AC
  Administered 2019-10-05: 500 mg via ORAL

## 2019-10-05 MED ORDER — SODIUM CHLORIDE 0.9 % IV SOLN
100.0000 mg | Freq: Once | INTRAVENOUS | Status: AC
  Administered 2019-10-05: 100 mg via INTRAVENOUS
  Filled 2019-10-05: qty 2

## 2019-10-16 ENCOUNTER — Ambulatory Visit
Admission: RE | Admit: 2019-10-16 | Discharge: 2019-10-16 | Disposition: A | Discharge status: Home / Self Care | Payer: Medicare Other | Source: Ambulatory Visit | Attending: Orthopedic Surgery | Admitting: Orthopedic Surgery

## 2019-10-16 ENCOUNTER — Other Ambulatory Visit: Payer: Self-pay

## 2019-10-16 ENCOUNTER — Other Ambulatory Visit: Payer: Medicare Other

## 2019-10-16 DIAGNOSIS — M48062 Spinal stenosis, lumbar region with neurogenic claudication: Secondary | ICD-10-CM

## 2019-10-16 DIAGNOSIS — M545 Low back pain: Secondary | ICD-10-CM | POA: Diagnosis not present

## 2019-10-25 DIAGNOSIS — M1712 Unilateral primary osteoarthritis, left knee: Secondary | ICD-10-CM | POA: Diagnosis not present

## 2019-10-25 DIAGNOSIS — M545 Low back pain: Secondary | ICD-10-CM | POA: Diagnosis not present

## 2019-10-25 DIAGNOSIS — M48061 Spinal stenosis, lumbar region without neurogenic claudication: Secondary | ICD-10-CM | POA: Diagnosis not present

## 2019-11-14 DIAGNOSIS — F429 Obsessive-compulsive disorder, unspecified: Secondary | ICD-10-CM | POA: Diagnosis not present

## 2019-11-14 DIAGNOSIS — E782 Mixed hyperlipidemia: Secondary | ICD-10-CM | POA: Diagnosis not present

## 2019-11-14 DIAGNOSIS — M48061 Spinal stenosis, lumbar region without neurogenic claudication: Secondary | ICD-10-CM | POA: Diagnosis not present

## 2019-11-14 DIAGNOSIS — E611 Iron deficiency: Secondary | ICD-10-CM | POA: Diagnosis not present

## 2019-11-14 DIAGNOSIS — E119 Type 2 diabetes mellitus without complications: Secondary | ICD-10-CM | POA: Diagnosis not present

## 2019-11-14 DIAGNOSIS — J029 Acute pharyngitis, unspecified: Secondary | ICD-10-CM | POA: Diagnosis not present

## 2019-11-14 DIAGNOSIS — R5383 Other fatigue: Secondary | ICD-10-CM | POA: Diagnosis not present

## 2019-11-14 DIAGNOSIS — F324 Major depressive disorder, single episode, in partial remission: Secondary | ICD-10-CM | POA: Diagnosis not present

## 2019-11-21 DIAGNOSIS — H26492 Other secondary cataract, left eye: Secondary | ICD-10-CM | POA: Diagnosis not present

## 2019-11-21 DIAGNOSIS — Z9842 Cataract extraction status, left eye: Secondary | ICD-10-CM | POA: Diagnosis not present

## 2019-11-28 DIAGNOSIS — M545 Low back pain: Secondary | ICD-10-CM | POA: Diagnosis not present

## 2019-11-28 DIAGNOSIS — M461 Sacroiliitis, not elsewhere classified: Secondary | ICD-10-CM | POA: Diagnosis not present

## 2020-01-02 DIAGNOSIS — R269 Unspecified abnormalities of gait and mobility: Secondary | ICD-10-CM | POA: Diagnosis not present

## 2020-01-02 DIAGNOSIS — M545 Low back pain: Secondary | ICD-10-CM | POA: Diagnosis not present

## 2020-01-10 DIAGNOSIS — H9313 Tinnitus, bilateral: Secondary | ICD-10-CM | POA: Diagnosis not present

## 2020-01-10 DIAGNOSIS — H9193 Unspecified hearing loss, bilateral: Secondary | ICD-10-CM | POA: Diagnosis not present

## 2020-01-11 DIAGNOSIS — R269 Unspecified abnormalities of gait and mobility: Secondary | ICD-10-CM | POA: Diagnosis not present

## 2020-01-11 DIAGNOSIS — M545 Low back pain: Secondary | ICD-10-CM | POA: Diagnosis not present

## 2020-01-14 DIAGNOSIS — M545 Low back pain: Secondary | ICD-10-CM | POA: Diagnosis not present

## 2020-01-14 DIAGNOSIS — R269 Unspecified abnormalities of gait and mobility: Secondary | ICD-10-CM | POA: Diagnosis not present

## 2020-01-22 DIAGNOSIS — R269 Unspecified abnormalities of gait and mobility: Secondary | ICD-10-CM | POA: Diagnosis not present

## 2020-01-22 DIAGNOSIS — M545 Low back pain: Secondary | ICD-10-CM | POA: Diagnosis not present

## 2020-01-23 DIAGNOSIS — H906 Mixed conductive and sensorineural hearing loss, bilateral: Secondary | ICD-10-CM | POA: Diagnosis not present

## 2020-01-25 DIAGNOSIS — R269 Unspecified abnormalities of gait and mobility: Secondary | ICD-10-CM | POA: Diagnosis not present

## 2020-01-29 DIAGNOSIS — M545 Low back pain: Secondary | ICD-10-CM | POA: Diagnosis not present

## 2020-01-29 DIAGNOSIS — R269 Unspecified abnormalities of gait and mobility: Secondary | ICD-10-CM | POA: Diagnosis not present

## 2020-01-31 ENCOUNTER — Encounter: Payer: Self-pay | Admitting: Cardiology

## 2020-01-31 ENCOUNTER — Other Ambulatory Visit: Payer: Self-pay

## 2020-01-31 ENCOUNTER — Ambulatory Visit (INDEPENDENT_AMBULATORY_CARE_PROVIDER_SITE_OTHER): Payer: Medicare Other | Admitting: Cardiology

## 2020-01-31 VITALS — BP 114/80 | HR 75 | Ht 69.0 in | Wt 146.4 lb

## 2020-01-31 DIAGNOSIS — I10 Essential (primary) hypertension: Secondary | ICD-10-CM

## 2020-01-31 DIAGNOSIS — Z9861 Coronary angioplasty status: Secondary | ICD-10-CM

## 2020-01-31 DIAGNOSIS — I739 Peripheral vascular disease, unspecified: Secondary | ICD-10-CM | POA: Insufficient documentation

## 2020-01-31 DIAGNOSIS — I2510 Atherosclerotic heart disease of native coronary artery without angina pectoris: Secondary | ICD-10-CM | POA: Diagnosis not present

## 2020-01-31 DIAGNOSIS — E785 Hyperlipidemia, unspecified: Secondary | ICD-10-CM

## 2020-01-31 DIAGNOSIS — E1142 Type 2 diabetes mellitus with diabetic polyneuropathy: Secondary | ICD-10-CM

## 2020-01-31 NOTE — Patient Instructions (Signed)
Medication Instructions:   no changes  *If you need a refill on your cardiac medications before your next appointment, please call your pharmacy*   Lab Work: Not needed If you have labs (blood work) drawn today and your tests are completely normal, you will receive your results only by: Marland Kitchen MyChart Message (if you have MyChart) OR . A paper copy in the mail If you have any lab test that is abnormal or we need to change your treatment, we will call you to review the results.   Testing/Procedures:  will be schedule at [address] .Your physician has requested that you have an abdominal aorta duplex. During this test, an ultrasound is used to evaluate the aorta. Allow 30 minutes for this exam. Do not eat after midnight the day before and avoid carbonated beverages  And Your physician has requested that you have an ankle brachial index (ABI). During this test an ultrasound and blood pressure cuff are used to evaluate the arteries that supply the arms and legs with blood. Allow thirty minutes for this exam. There are no restrictions or special instructions. Your physician has requested that you have a lower or upper extremity arterial duplex. This test is an ultrasound of the arteries in the legs . It looks at arterial blood flow in the legs Allow one hour for Lower  Arterial scans. There are no restrictions or special instructions     Follow-Up: At [facility], you and your health needs are our priority.  As part of our continuing mission to provide you with exceptional heart care, we have created designated Provider Care Teams.  These Care Teams include your primary Cardiologist (physician) and Advanced Practice Providers (APPs -  Physician Assistants and Nurse Practitioners) who all work together to provide you with the care you need, when you need it.    Your next appointment:   12 month(s)  The format for your next appointment:   In Person  Provider:   Glenetta Hew, MD   Other Instructions
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
## 2020-02-01 DIAGNOSIS — R269 Unspecified abnormalities of gait and mobility: Secondary | ICD-10-CM | POA: Diagnosis not present

## 2020-02-05 ENCOUNTER — Encounter: Payer: Self-pay | Admitting: Cardiology

## 2020-02-05 NOTE — Assessment & Plan Note (Signed)
Not really an issue any more.  Did not tolerate beta-blocker.  He had a history of orthostatic symptoms in the past.  For that reason were simply staying off medications.  As it stands, his blood pressure is a little low to begin with at this time

## 2020-02-05 NOTE — Assessment & Plan Note (Signed)
Very distant history of PCI with no recurrent anginal symptoms.  He did not tolerate beta-blocker because of fatigue.  That was stopped.  Plan: Especially with concern now possible PAD, will continue maintenance dose Plavix along with rosuvastatin which he takes 1/2 tablet nightly. He is not on any blood pressure medications.  Fatigue seems to be doing better.  Tolerating statin  Plan: For now we will continue without beta-blocker or calcium channel blocker.  Simply continue Plavix and low-dose statin.   Okay to hold Plavix 5-7 days preop for any surgeries or procedures.

## 2020-02-05 NOTE — Assessment & Plan Note (Addendum)
He has been noting some strange leg discomfort this limiting his walking.  He is not sure if it is bone or joint related versus neuropathy, but it is worse with exertion and better with rest. With him having back pain and diabetes with some neuropathy in the past, my suspicion is that the symptoms are probably pseudoclaudication however the 5 is made worse with exertion is somewhat concerning.  He asked about could this potentially be related to PAD.  He also indicated that his PCP had mentioned something about AAA screening.  Both these are reasonable screening test to proceed with based on his potential claudication symptoms.  Plan:  AAA duplex  ABIs and TBI's with Lower Extremity Arterial Dopplers if indicated.

## 2020-02-05 NOTE — Assessment & Plan Note (Signed)
Back on very low-dose statin and most recent labs showed excellent control.  In fact, his LDL is lower than the [place] goal of 50. I told him that if he wanted only to get 5 days a week he could.

## 2020-02-08 ENCOUNTER — Other Ambulatory Visit: Payer: Self-pay | Admitting: Cardiology

## 2020-02-08 DIAGNOSIS — I714 Abdominal aortic aneurysm, without rupture, unspecified: Secondary | ICD-10-CM

## 2020-02-08 DIAGNOSIS — I739 Peripheral vascular disease, unspecified: Secondary | ICD-10-CM

## 2020-02-18 DIAGNOSIS — R2689 Other abnormalities of gait and mobility: Secondary | ICD-10-CM | POA: Diagnosis not present

## 2020-02-18 DIAGNOSIS — E1140 Type 2 diabetes mellitus with diabetic neuropathy, unspecified: Secondary | ICD-10-CM | POA: Diagnosis not present

## 2020-02-20 ENCOUNTER — Other Ambulatory Visit: Payer: Self-pay | Admitting: Internal Medicine

## 2020-02-20 DIAGNOSIS — R269 Unspecified abnormalities of gait and mobility: Secondary | ICD-10-CM

## 2020-02-20 DIAGNOSIS — R32 Unspecified urinary incontinence: Secondary | ICD-10-CM | POA: Diagnosis not present

## 2020-02-20 DIAGNOSIS — R413 Other amnesia: Secondary | ICD-10-CM | POA: Diagnosis not present

## 2020-02-29 ENCOUNTER — Ambulatory Visit (HOSPITAL_COMMUNITY)
Admission: RE | Admit: 2020-02-29 | Discharge: 2020-02-29 | Disposition: A | Discharge status: Home / Self Care | Payer: Medicare Other | Source: Ambulatory Visit | Attending: Cardiology | Admitting: Cardiology

## 2020-02-29 ENCOUNTER — Ambulatory Visit (HOSPITAL_BASED_OUTPATIENT_CLINIC_OR_DEPARTMENT_OTHER)
Admission: RE | Admit: 2020-02-29 | Discharge: 2020-02-29 | Disposition: A | Discharge status: Home / Self Care | Payer: Medicare Other | Source: Ambulatory Visit | Attending: Cardiology | Admitting: Cardiology

## 2020-02-29 ENCOUNTER — Other Ambulatory Visit: Payer: Self-pay

## 2020-02-29 DIAGNOSIS — I714 Abdominal aortic aneurysm, without rupture, unspecified: Secondary | ICD-10-CM

## 2020-02-29 DIAGNOSIS — I739 Peripheral vascular disease, unspecified: Secondary | ICD-10-CM | POA: Diagnosis not present

## 2020-03-05 ENCOUNTER — Ambulatory Visit
Admission: RE | Admit: 2020-03-05 | Discharge: 2020-03-05 | Disposition: A | Discharge status: Home / Self Care | Payer: Medicare Other | Source: Ambulatory Visit | Attending: Internal Medicine | Admitting: Internal Medicine

## 2020-03-05 ENCOUNTER — Other Ambulatory Visit: Payer: Self-pay

## 2020-03-05 DIAGNOSIS — R269 Unspecified abnormalities of gait and mobility: Secondary | ICD-10-CM

## 2020-03-05 DIAGNOSIS — I6782 Cerebral ischemia: Secondary | ICD-10-CM | POA: Diagnosis not present

## 2020-03-05 DIAGNOSIS — G319 Degenerative disease of nervous system, unspecified: Secondary | ICD-10-CM | POA: Diagnosis not present

## 2020-03-05 DIAGNOSIS — R2689 Other abnormalities of gait and mobility: Secondary | ICD-10-CM | POA: Diagnosis not present

## 2020-03-08 NOTE — Progress Notes (Signed)
No evidence of abdominal aortic aneurysm.  There does appear to be mild dilation compared to prior study but not to the point of concern.  2.5 cm from 2.1 cm in 2014.  Can probably recheck in about 3 years.  Glenetta Hew, MD

## 2020-03-13 DIAGNOSIS — E611 Iron deficiency: Secondary | ICD-10-CM | POA: Diagnosis not present

## 2020-03-13 DIAGNOSIS — Z1389 Encounter for screening for other disorder: Secondary | ICD-10-CM | POA: Diagnosis not present

## 2020-03-13 DIAGNOSIS — Z23 Encounter for immunization: Secondary | ICD-10-CM | POA: Diagnosis not present

## 2020-03-13 DIAGNOSIS — M48061 Spinal stenosis, lumbar region without neurogenic claudication: Secondary | ICD-10-CM | POA: Diagnosis not present

## 2020-03-13 DIAGNOSIS — F324 Major depressive disorder, single episode, in partial remission: Secondary | ICD-10-CM | POA: Diagnosis not present

## 2020-03-13 DIAGNOSIS — F429 Obsessive-compulsive disorder, unspecified: Secondary | ICD-10-CM | POA: Diagnosis not present

## 2020-03-13 DIAGNOSIS — R413 Other amnesia: Secondary | ICD-10-CM | POA: Diagnosis not present

## 2020-03-13 DIAGNOSIS — R269 Unspecified abnormalities of gait and mobility: Secondary | ICD-10-CM | POA: Diagnosis not present

## 2020-03-13 DIAGNOSIS — Z7189 Other specified counseling: Secondary | ICD-10-CM | POA: Diagnosis not present

## 2020-03-13 DIAGNOSIS — E782 Mixed hyperlipidemia: Secondary | ICD-10-CM | POA: Diagnosis not present

## 2020-03-13 DIAGNOSIS — E119 Type 2 diabetes mellitus without complications: Secondary | ICD-10-CM | POA: Diagnosis not present

## 2020-03-13 DIAGNOSIS — R32 Unspecified urinary incontinence: Secondary | ICD-10-CM | POA: Diagnosis not present

## 2020-03-13 DIAGNOSIS — Z0000 Encounter for general adult medical examination without abnormal findings: Secondary | ICD-10-CM | POA: Diagnosis not present

## 2020-03-13 DIAGNOSIS — R5383 Other fatigue: Secondary | ICD-10-CM | POA: Diagnosis not present

## 2020-04-02 ENCOUNTER — Telehealth: Payer: Self-pay | Admitting: *Deleted

## 2020-04-02 DIAGNOSIS — M48062 Spinal stenosis, lumbar region with neurogenic claudication: Secondary | ICD-10-CM | POA: Diagnosis not present

## 2020-04-02 NOTE — Telephone Encounter (Signed)
Jacob Dalton. Talent 74 year old male is requesting lumbar myelogram.  He was last seen in the clinic on 01/31/2020.  He was doing well at that time aside from lower extremity claudication and neuropathy type symptoms.  May his Plavix be held prior to the procedure?  His PMH includes-anemia, anxiety, arthritis, CAD status post PCI 6/11, clotting disorder, depression, GERD, hypertension, vitamin B12 deficiency.   Please direct response to CV DIV preop pool.  Thank you for your help.   Jossie Ng. Jacob Mehler NP-C    04/02/2020, 3:26 PM [place] [address] Office [phone] Fax [phone]

## 2020-04-02 NOTE — Telephone Encounter (Signed)
   [facility] Pre-operative Risk Assessment     Request for surgical clearance:  1. What type of surgery is being performed? LUMBAR MYELOGRAM WITH POST MYELOGRAM CT SCAN    2. When is this surgery scheduled?  TBD   3. What type of clearance is required (medical clearance vs. Pharmacy clearance to hold med vs. Both)? BOTH  4. Are there any medications that need to be held prior to surgery and how long? PLAVIX   5. Practice name and name of physician performing surgery?  [facility]   6. What is the office phone number?  [phone]   7.   What is the office fax number? [phone]  8.   Anesthesia type (None, local, MAC, general) ?  VALIUM SEDATION   Devra Dopp 04/02/2020, 3:10 PM  _________________________________________________________________   (provider comments below)

## 2020-04-04 NOTE — Telephone Encounter (Signed)
Yes it is okay for no Plavix for 5 days prior to surgery.  Can see this in my note assessment & plan -- I try to comment on this for most patients on [facility].  Glenetta Hew, MD

## 2020-04-07 NOTE — Telephone Encounter (Signed)
   Primary Cardiologist: Glenetta Hew, MD  Chart reviewed as part of pre-operative protocol coverage. Patient was contacted 04/07/2020 in reference to pre-operative risk assessment for pending surgery as outlined below.  Jacob Dalton was last seen on 01/31/20 by Dr. Ruby Cola.  Since that day, Jacob Dalton has done well. He walks 2-3 miles most days without angina.   Per Dr. Ellyn Hack -  OK to hold plavix 5-7 days, restart as soon as safe to do so, per surgeon.  Therefore, based on ACC/AHA guidelines, the patient would be at acceptable risk for the planned procedure without further cardiovascular testing.   The patient was advised that if he develops new symptoms prior to surgery to contact our office to arrange for a follow-up visit, and he verbalized understanding.  I will route this recommendation to the requesting party via Epic fax function and remove from pre-op pool. Please call with questions.  Tami Lin Solan Vosler, PA 04/07/2020, 8:47 AM

## 2020-04-08 DIAGNOSIS — F09 Unspecified mental disorder due to known physiological condition: Secondary | ICD-10-CM | POA: Diagnosis not present

## 2020-04-08 DIAGNOSIS — G3184 Mild cognitive impairment, so stated: Secondary | ICD-10-CM | POA: Diagnosis not present

## 2020-04-08 DIAGNOSIS — R269 Unspecified abnormalities of gait and mobility: Secondary | ICD-10-CM | POA: Diagnosis not present

## 2020-04-09 ENCOUNTER — Other Ambulatory Visit (HOSPITAL_COMMUNITY): Payer: Self-pay | Admitting: Neurological Surgery

## 2020-04-09 ENCOUNTER — Other Ambulatory Visit: Payer: Self-pay | Admitting: Neurological Surgery

## 2020-04-09 DIAGNOSIS — M48062 Spinal stenosis, lumbar region with neurogenic claudication: Secondary | ICD-10-CM

## 2020-04-14 DIAGNOSIS — N138 Other obstructive and reflux uropathy: Secondary | ICD-10-CM | POA: Diagnosis not present

## 2020-04-14 DIAGNOSIS — N3941 Urge incontinence: Secondary | ICD-10-CM | POA: Diagnosis not present

## 2020-04-14 DIAGNOSIS — N529 Male erectile dysfunction, unspecified: Secondary | ICD-10-CM | POA: Diagnosis not present

## 2020-04-14 DIAGNOSIS — N401 Enlarged prostate with lower urinary tract symptoms: Secondary | ICD-10-CM | POA: Diagnosis not present

## 2020-04-24 ENCOUNTER — Ambulatory Visit (HOSPITAL_COMMUNITY)
Admission: RE | Admit: 2020-04-24 | Discharge: 2020-04-24 | Disposition: A | Discharge status: Home / Self Care | Payer: Medicare Other | Source: Ambulatory Visit | Attending: Neurological Surgery | Admitting: Neurological Surgery

## 2020-04-24 ENCOUNTER — Other Ambulatory Visit: Payer: Self-pay

## 2020-04-24 DIAGNOSIS — M5127 Other intervertebral disc displacement, lumbosacral region: Secondary | ICD-10-CM | POA: Diagnosis not present

## 2020-04-24 DIAGNOSIS — M5136 Other intervertebral disc degeneration, lumbar region: Secondary | ICD-10-CM | POA: Diagnosis not present

## 2020-04-24 DIAGNOSIS — M48062 Spinal stenosis, lumbar region with neurogenic claudication: Secondary | ICD-10-CM | POA: Diagnosis not present

## 2020-04-24 DIAGNOSIS — M5126 Other intervertebral disc displacement, lumbar region: Secondary | ICD-10-CM | POA: Diagnosis not present

## 2020-04-24 DIAGNOSIS — M48061 Spinal stenosis, lumbar region without neurogenic claudication: Secondary | ICD-10-CM | POA: Diagnosis not present

## 2020-04-24 LAB — CSF CELL COUNT WITH DIFFERENTIAL
RBC Count, CSF: 1 /mm3 — ABNORMAL HIGH
Tube #: 3
WBC, CSF: 0 /mm3 (ref 0–5)

## 2020-04-24 LAB — GLUCOSE, CAPILLARY: Glucose-Capillary: 115 mg/dL — ABNORMAL HIGH (ref 70–99)

## 2020-04-24 LAB — PROTEIN AND GLUCOSE, CSF
Glucose, CSF: 69 mg/dL (ref 40–70)
Total  Protein, CSF: 65 mg/dL — ABNORMAL HIGH (ref 15–45)

## 2020-04-24 MED ORDER — ONDANSETRON HCL 4 MG/2ML IJ SOLN: 4.0000 mg | Freq: Four times a day (QID) | INTRAMUSCULAR | Status: DC | PRN

## 2020-04-24 MED ORDER — DIAZEPAM 5 MG PO TABS
ORAL_TABLET | ORAL | Status: AC
  Administered 2020-04-24: 10 mg via ORAL
  Filled 2020-04-24: qty 2

## 2020-04-24 MED ORDER — IOHEXOL 180 MG/ML  SOLN
20.0000 mL | Freq: Once | INTRAMUSCULAR | Status: AC | PRN
  Administered 2020-04-24: 12 mL via INTRATHECAL

## 2020-04-24 MED ORDER — LIDOCAINE HCL (PF) 1 % IJ SOLN
5.0000 mL | Freq: Once | INTRAMUSCULAR | Status: AC
  Administered 2020-04-24: 5 mL via INTRADERMAL

## 2020-04-24 MED ORDER — DIAZEPAM 5 MG PO TABS: 10.0000 mg | ORAL_TABLET | Freq: Once | ORAL | Status: AC

## 2020-04-24 NOTE — Discharge Instructions (Signed)
Myelogram and Lumbar Puncture Discharge Instructions  1. Go home and rest quietly for the next 24 hours.  It is important to lie flat for the next 24 hours.  Get up only to go to the restroom.  You may lie in the bed or on a couch on your back, your stomach, your left side or your right side.  You may have one pillow under your head.  You may have pillows between your knees while you are on your side or under your knees while you are on your back.  2. DO NOT drive today.  Recline the seat as far back as it will go, while still wearing your seat belt, on the way home.  3. You may get up to go to the bathroom as needed.  You may sit up for 10 minutes to eat.  You may resume your normal diet and medications unless otherwise indicated.  4. The incidence of headache, nausea, or vomiting is about 5% (one in 20 patients).  If you develop a headache, lie flat and drink plenty of fluids until the headache goes away.  Caffeinated beverages may be helpful.  If you develop severe nausea and vomiting or a headache that does not go away with flat bed rest, call [facility] office.  5. You may resume normal activities after your 24 hours of bed rest is over; however, do not exert yourself strongly or do any heavy lifting tomorrow.  6. Call your physician for a follow-up appointment.  The results of your myelogram will be sent directly to your physician by the following day.  Discharge instructions have been explained to the patient.  The patient, or the person responsible for the patient, fully understands these instructions.

## 2020-04-24 NOTE — Procedures (Signed)
Jacob Dalton is a 74 year old individual who has had significant difficulties with his gait.  He notes that over time he has had slowing of his gait and some instability in his legs.  Finds that after while he walks with a slight forward stoop.  His motor function he feels has been good and he has not had any significant elements of back pain has been concerned that he might have normal pressure hydrocephalus or some other such findings however on reviewing his films I note that he has a mild lumbar scoliosis and some anterolisthesis of L3 on L4.  I raised the possibility that he may have spinal stenosis as the underlying cause of his problems.  Because the neurologist have otherwise concern were concerned that he might have normal pressure hydrocephalus and the patient's own research suggest this I advised that we do the lumbar puncture we will formally test his spinal fluid and also evaluate whether he has improvement after a spinal puncture.  Lumbar puncture was then performed at the level of L2-L3 with the patient in the left lateral decubitus position.  Opening pressure was noted to be 90 mm of water.  A total of 38 cc of spinal fluid was drained from the patient's spinal canal and the closing pressure was 3 cm of water.  Prior to the lumbar puncture of time course of walking a distance of 70 feet took 30 seconds.  After the lumbar puncture was completed the time distance of walking the same course was 32 seconds.  CSF is being sent for cells glucose protein and a separate tube is being kept for any additional test that may be requested.  Pre op Dx: Lumbar stenosis. Post op Dx: Lumbar stenosis. Procedure: Lumbar myelogram Surgeon: Ellene Route Puncture level: L2-3 Fluid color: Clear colorless Injection: Iohexol 180, 12 mL Findings: Moderate stenosis at the L3-4 level worse with extension reduces with flexion.  Further evaluation with CT scanning of the lumbar spine from T10-S1.

## 2020-04-30 DIAGNOSIS — R030 Elevated blood-pressure reading, without diagnosis of hypertension: Secondary | ICD-10-CM | POA: Diagnosis not present

## 2020-05-02 ENCOUNTER — Ambulatory Visit: Payer: Medicare Other | Admitting: Neurology

## 2020-05-05 DIAGNOSIS — N138 Other obstructive and reflux uropathy: Secondary | ICD-10-CM | POA: Diagnosis not present

## 2020-05-05 DIAGNOSIS — N3289 Other specified disorders of bladder: Secondary | ICD-10-CM | POA: Diagnosis not present

## 2020-05-05 DIAGNOSIS — N401 Enlarged prostate with lower urinary tract symptoms: Secondary | ICD-10-CM | POA: Diagnosis not present

## 2020-05-14 DIAGNOSIS — D692 Other nonthrombocytopenic purpura: Secondary | ICD-10-CM | POA: Diagnosis not present

## 2020-05-14 DIAGNOSIS — L570 Actinic keratosis: Secondary | ICD-10-CM | POA: Diagnosis not present

## 2020-05-14 DIAGNOSIS — L218 Other seborrheic dermatitis: Secondary | ICD-10-CM | POA: Diagnosis not present

## 2020-05-14 DIAGNOSIS — L821 Other seborrheic keratosis: Secondary | ICD-10-CM | POA: Diagnosis not present

## 2020-05-14 DIAGNOSIS — Z85828 Personal history of other malignant neoplasm of skin: Secondary | ICD-10-CM | POA: Diagnosis not present

## 2020-05-19 DIAGNOSIS — N529 Male erectile dysfunction, unspecified: Secondary | ICD-10-CM | POA: Diagnosis not present

## 2020-05-19 DIAGNOSIS — N138 Other obstructive and reflux uropathy: Secondary | ICD-10-CM | POA: Diagnosis not present

## 2020-05-19 DIAGNOSIS — N401 Enlarged prostate with lower urinary tract symptoms: Secondary | ICD-10-CM | POA: Diagnosis not present

## 2020-05-19 DIAGNOSIS — N3941 Urge incontinence: Secondary | ICD-10-CM | POA: Diagnosis not present

## 2020-06-12 DIAGNOSIS — Z20822 Contact with and (suspected) exposure to covid-19: Secondary | ICD-10-CM | POA: Diagnosis not present

## 2020-06-15 DIAGNOSIS — Z20822 Contact with and (suspected) exposure to covid-19: Secondary | ICD-10-CM | POA: Diagnosis not present

## 2020-07-10 DIAGNOSIS — N401 Enlarged prostate with lower urinary tract symptoms: Secondary | ICD-10-CM | POA: Diagnosis not present

## 2020-07-10 DIAGNOSIS — N138 Other obstructive and reflux uropathy: Secondary | ICD-10-CM | POA: Diagnosis not present

## 2020-07-10 DIAGNOSIS — N529 Male erectile dysfunction, unspecified: Secondary | ICD-10-CM | POA: Diagnosis not present

## 2020-07-10 DIAGNOSIS — N3941 Urge incontinence: Secondary | ICD-10-CM | POA: Diagnosis not present

## 2020-07-12 ENCOUNTER — Other Ambulatory Visit: Payer: Self-pay | Admitting: Cardiology

## 2020-07-18 DIAGNOSIS — G3184 Mild cognitive impairment, so stated: Secondary | ICD-10-CM | POA: Diagnosis not present

## 2020-07-18 DIAGNOSIS — G5793 Unspecified mononeuropathy of bilateral lower limbs: Secondary | ICD-10-CM | POA: Diagnosis not present

## 2020-07-18 DIAGNOSIS — R2689 Other abnormalities of gait and mobility: Secondary | ICD-10-CM | POA: Diagnosis not present

## 2020-07-25 ENCOUNTER — Other Ambulatory Visit: Payer: Self-pay

## 2020-07-25 ENCOUNTER — Ambulatory Visit: Payer: Medicare Other | Attending: Neurology

## 2020-07-25 DIAGNOSIS — R262 Difficulty in walking, not elsewhere classified: Secondary | ICD-10-CM | POA: Insufficient documentation

## 2020-07-25 DIAGNOSIS — R2681 Unsteadiness on feet: Secondary | ICD-10-CM | POA: Diagnosis not present

## 2020-07-25 DIAGNOSIS — H8111 Benign paroxysmal vertigo, right ear: Secondary | ICD-10-CM | POA: Insufficient documentation

## 2020-07-25 DIAGNOSIS — R2689 Other abnormalities of gait and mobility: Secondary | ICD-10-CM | POA: Insufficient documentation

## 2020-07-25 DIAGNOSIS — R42 Dizziness and giddiness: Secondary | ICD-10-CM | POA: Diagnosis not present

## 2020-07-25 DIAGNOSIS — M6281 Muscle weakness (generalized): Secondary | ICD-10-CM | POA: Insufficient documentation
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
## 2020-07-29 ENCOUNTER — Encounter: Payer: Self-pay | Admitting: Physical Therapy

## 2020-07-29 ENCOUNTER — Other Ambulatory Visit: Payer: Self-pay

## 2020-07-29 ENCOUNTER — Ambulatory Visit: Payer: Medicare Other | Admitting: Physical Therapy

## 2020-07-29 VITALS — BP 134/81 | HR 80

## 2020-07-29 DIAGNOSIS — R2689 Other abnormalities of gait and mobility: Secondary | ICD-10-CM | POA: Diagnosis not present

## 2020-07-29 DIAGNOSIS — H8111 Benign paroxysmal vertigo, right ear: Secondary | ICD-10-CM | POA: Diagnosis not present

## 2020-07-29 DIAGNOSIS — R262 Difficulty in walking, not elsewhere classified: Secondary | ICD-10-CM

## 2020-07-29 DIAGNOSIS — R2681 Unsteadiness on feet: Secondary | ICD-10-CM

## 2020-07-29 DIAGNOSIS — R42 Dizziness and giddiness: Secondary | ICD-10-CM | POA: Diagnosis not present

## 2020-07-29 DIAGNOSIS — M6281 Muscle weakness (generalized): Secondary | ICD-10-CM | POA: Diagnosis not present

## 2020-07-29 NOTE — Patient Instructions (Signed)
Access Code: [facility] URL: [email or web address] Date: 07/29/2020 Prepared by: Janann August  Exercises Sit to Stand - 2 x daily - 5 x weekly - 1 sets - 10 reps Romberg Stance Eyes Closed on Foam Pad - 1-2 x daily - 5 x weekly - 3 sets - 30 hold Romberg Stance on Foam Pad with Head Rotation - 1-2 x daily - 5 x weekly - 2 sets - 10 reps Tandem Walking with Counter Support - 1-2 x daily - 5 x weekly - 3 sets Standing Marching - 1-2 x daily - 5 x weekly - 3 sets
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
## 2020-08-01 ENCOUNTER — Other Ambulatory Visit: Payer: Self-pay

## 2020-08-01 ENCOUNTER — Ambulatory Visit: Payer: Medicare Other

## 2020-08-01 DIAGNOSIS — H8111 Benign paroxysmal vertigo, right ear: Secondary | ICD-10-CM | POA: Diagnosis not present

## 2020-08-01 DIAGNOSIS — R2689 Other abnormalities of gait and mobility: Secondary | ICD-10-CM

## 2020-08-01 DIAGNOSIS — R2681 Unsteadiness on feet: Secondary | ICD-10-CM

## 2020-08-01 DIAGNOSIS — R262 Difficulty in walking, not elsewhere classified: Secondary | ICD-10-CM

## 2020-08-01 DIAGNOSIS — R42 Dizziness and giddiness: Secondary | ICD-10-CM

## 2020-08-01 DIAGNOSIS — M6281 Muscle weakness (generalized): Secondary | ICD-10-CM | POA: Diagnosis not present
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
## 2020-08-05 ENCOUNTER — Other Ambulatory Visit: Payer: Self-pay

## 2020-08-05 ENCOUNTER — Ambulatory Visit: Payer: Medicare Other

## 2020-08-05 DIAGNOSIS — R42 Dizziness and giddiness: Secondary | ICD-10-CM | POA: Diagnosis not present

## 2020-08-05 DIAGNOSIS — M6281 Muscle weakness (generalized): Secondary | ICD-10-CM | POA: Diagnosis not present

## 2020-08-05 DIAGNOSIS — R2689 Other abnormalities of gait and mobility: Secondary | ICD-10-CM

## 2020-08-05 DIAGNOSIS — H8111 Benign paroxysmal vertigo, right ear: Secondary | ICD-10-CM | POA: Diagnosis not present

## 2020-08-05 DIAGNOSIS — R2681 Unsteadiness on feet: Secondary | ICD-10-CM | POA: Diagnosis not present

## 2020-08-05 DIAGNOSIS — R262 Difficulty in walking, not elsewhere classified: Secondary | ICD-10-CM | POA: Diagnosis not present
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
## 2020-08-07 ENCOUNTER — Ambulatory Visit: Payer: Medicare Other

## 2020-08-07 ENCOUNTER — Other Ambulatory Visit: Payer: Self-pay

## 2020-08-07 DIAGNOSIS — M6281 Muscle weakness (generalized): Secondary | ICD-10-CM | POA: Diagnosis not present

## 2020-08-07 DIAGNOSIS — R42 Dizziness and giddiness: Secondary | ICD-10-CM | POA: Diagnosis not present

## 2020-08-07 DIAGNOSIS — R2689 Other abnormalities of gait and mobility: Secondary | ICD-10-CM

## 2020-08-07 DIAGNOSIS — R262 Difficulty in walking, not elsewhere classified: Secondary | ICD-10-CM | POA: Diagnosis not present

## 2020-08-07 DIAGNOSIS — H8111 Benign paroxysmal vertigo, right ear: Secondary | ICD-10-CM | POA: Diagnosis not present

## 2020-08-07 DIAGNOSIS — R2681 Unsteadiness on feet: Secondary | ICD-10-CM | POA: Diagnosis not present
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
## 2020-08-13 ENCOUNTER — Ambulatory Visit: Payer: Medicare Other

## 2020-08-13 ENCOUNTER — Other Ambulatory Visit: Payer: Self-pay

## 2020-08-13 DIAGNOSIS — R2681 Unsteadiness on feet: Secondary | ICD-10-CM | POA: Diagnosis not present

## 2020-08-13 DIAGNOSIS — H8111 Benign paroxysmal vertigo, right ear: Secondary | ICD-10-CM | POA: Diagnosis not present

## 2020-08-13 DIAGNOSIS — R2689 Other abnormalities of gait and mobility: Secondary | ICD-10-CM

## 2020-08-13 DIAGNOSIS — M6281 Muscle weakness (generalized): Secondary | ICD-10-CM | POA: Diagnosis not present

## 2020-08-13 DIAGNOSIS — R42 Dizziness and giddiness: Secondary | ICD-10-CM | POA: Diagnosis not present

## 2020-08-13 DIAGNOSIS — R262 Difficulty in walking, not elsewhere classified: Secondary | ICD-10-CM | POA: Diagnosis not present
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
## 2020-08-14 ENCOUNTER — Ambulatory Visit: Payer: Medicare Other

## 2020-08-14 DIAGNOSIS — R2681 Unsteadiness on feet: Secondary | ICD-10-CM | POA: Diagnosis not present

## 2020-08-14 DIAGNOSIS — R2689 Other abnormalities of gait and mobility: Secondary | ICD-10-CM | POA: Diagnosis not present

## 2020-08-14 DIAGNOSIS — M6281 Muscle weakness (generalized): Secondary | ICD-10-CM | POA: Diagnosis not present

## 2020-08-14 DIAGNOSIS — R262 Difficulty in walking, not elsewhere classified: Secondary | ICD-10-CM | POA: Diagnosis not present

## 2020-08-14 DIAGNOSIS — R42 Dizziness and giddiness: Secondary | ICD-10-CM | POA: Diagnosis not present

## 2020-08-14 DIAGNOSIS — H8111 Benign paroxysmal vertigo, right ear: Secondary | ICD-10-CM
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
## 2020-08-19 ENCOUNTER — Other Ambulatory Visit: Payer: Self-pay

## 2020-08-19 ENCOUNTER — Ambulatory Visit: Payer: Medicare Other | Attending: Neurology

## 2020-08-19 DIAGNOSIS — M6281 Muscle weakness (generalized): Secondary | ICD-10-CM | POA: Insufficient documentation

## 2020-08-19 DIAGNOSIS — H8111 Benign paroxysmal vertigo, right ear: Secondary | ICD-10-CM | POA: Diagnosis not present

## 2020-08-19 DIAGNOSIS — R42 Dizziness and giddiness: Secondary | ICD-10-CM | POA: Insufficient documentation

## 2020-08-19 DIAGNOSIS — R2689 Other abnormalities of gait and mobility: Secondary | ICD-10-CM | POA: Insufficient documentation

## 2020-08-19 DIAGNOSIS — R262 Difficulty in walking, not elsewhere classified: Secondary | ICD-10-CM | POA: Insufficient documentation

## 2020-08-19 DIAGNOSIS — R2681 Unsteadiness on feet: Secondary | ICD-10-CM | POA: Insufficient documentation
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
## 2020-08-21 ENCOUNTER — Ambulatory Visit: Payer: Medicare Other

## 2020-08-21 ENCOUNTER — Other Ambulatory Visit: Payer: Self-pay

## 2020-08-21 DIAGNOSIS — R42 Dizziness and giddiness: Secondary | ICD-10-CM | POA: Diagnosis not present

## 2020-08-21 DIAGNOSIS — R2681 Unsteadiness on feet: Secondary | ICD-10-CM | POA: Diagnosis not present

## 2020-08-21 DIAGNOSIS — R2689 Other abnormalities of gait and mobility: Secondary | ICD-10-CM | POA: Diagnosis not present

## 2020-08-21 DIAGNOSIS — H8111 Benign paroxysmal vertigo, right ear: Secondary | ICD-10-CM | POA: Diagnosis not present

## 2020-08-21 DIAGNOSIS — R262 Difficulty in walking, not elsewhere classified: Secondary | ICD-10-CM | POA: Diagnosis not present

## 2020-08-21 DIAGNOSIS — M6281 Muscle weakness (generalized): Secondary | ICD-10-CM | POA: Diagnosis not present
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
## 2020-08-26 ENCOUNTER — Ambulatory Visit: Payer: Medicare Other

## 2020-08-26 ENCOUNTER — Other Ambulatory Visit: Payer: Self-pay

## 2020-08-26 DIAGNOSIS — M6281 Muscle weakness (generalized): Secondary | ICD-10-CM

## 2020-08-26 DIAGNOSIS — R2689 Other abnormalities of gait and mobility: Secondary | ICD-10-CM | POA: Diagnosis not present

## 2020-08-26 DIAGNOSIS — R262 Difficulty in walking, not elsewhere classified: Secondary | ICD-10-CM | POA: Diagnosis not present

## 2020-08-26 DIAGNOSIS — R42 Dizziness and giddiness: Secondary | ICD-10-CM | POA: Diagnosis not present

## 2020-08-26 DIAGNOSIS — H8111 Benign paroxysmal vertigo, right ear: Secondary | ICD-10-CM | POA: Diagnosis not present

## 2020-08-26 DIAGNOSIS — R2681 Unsteadiness on feet: Secondary | ICD-10-CM | POA: Diagnosis not present
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
## 2020-08-28 ENCOUNTER — Ambulatory Visit: Payer: Medicare Other

## 2020-08-29 DIAGNOSIS — G5793 Unspecified mononeuropathy of bilateral lower limbs: Secondary | ICD-10-CM | POA: Diagnosis not present

## 2020-08-29 DIAGNOSIS — M5416 Radiculopathy, lumbar region: Secondary | ICD-10-CM | POA: Diagnosis not present

## 2020-09-02 ENCOUNTER — Other Ambulatory Visit: Payer: Self-pay

## 2020-09-02 ENCOUNTER — Ambulatory Visit: Payer: Medicare Other

## 2020-09-02 DIAGNOSIS — R2681 Unsteadiness on feet: Secondary | ICD-10-CM

## 2020-09-02 DIAGNOSIS — R262 Difficulty in walking, not elsewhere classified: Secondary | ICD-10-CM

## 2020-09-02 DIAGNOSIS — R2689 Other abnormalities of gait and mobility: Secondary | ICD-10-CM | POA: Diagnosis not present

## 2020-09-02 DIAGNOSIS — M6281 Muscle weakness (generalized): Secondary | ICD-10-CM

## 2020-09-02 DIAGNOSIS — R42 Dizziness and giddiness: Secondary | ICD-10-CM | POA: Diagnosis not present

## 2020-09-02 DIAGNOSIS — H8111 Benign paroxysmal vertigo, right ear: Secondary | ICD-10-CM | POA: Diagnosis not present
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
## 2020-09-04 ENCOUNTER — Ambulatory Visit: Payer: Medicare Other

## 2020-09-04 ENCOUNTER — Other Ambulatory Visit: Payer: Self-pay

## 2020-09-04 DIAGNOSIS — R262 Difficulty in walking, not elsewhere classified: Secondary | ICD-10-CM

## 2020-09-04 DIAGNOSIS — R2689 Other abnormalities of gait and mobility: Secondary | ICD-10-CM

## 2020-09-04 DIAGNOSIS — R2681 Unsteadiness on feet: Secondary | ICD-10-CM

## 2020-09-04 DIAGNOSIS — H8111 Benign paroxysmal vertigo, right ear: Secondary | ICD-10-CM | POA: Diagnosis not present

## 2020-09-04 DIAGNOSIS — M6281 Muscle weakness (generalized): Secondary | ICD-10-CM | POA: Diagnosis not present

## 2020-09-04 DIAGNOSIS — R42 Dizziness and giddiness: Secondary | ICD-10-CM | POA: Diagnosis not present
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
## 2020-09-04 NOTE — Patient Instructions (Signed)
Access Code: [facility] URL: [email or web address] Date: 09/04/2020 Prepared by: Baldomero Lamy  Exercises Sit to Stand - 2 x daily - 5 x weekly - 1 sets - 10 reps Romberg Stance Eyes Closed on Foam Pad - 1-2 x daily - 5 x weekly - 3 sets - 30 hold Romberg Stance on Foam Pad with Head Rotation - 1-2 x daily - 5 x weekly - 2 sets - 10 reps Tandem Walking with Counter Support - 1-2 x daily - 5 x weekly - 3 sets Standing Marching - 1-2 x daily - 5 x weekly - 3 sets Walking with Head Rotation - 1 x daily - 5 x weekly - 1 sets - 5 reps

## 2020-09-12 ENCOUNTER — Ambulatory Visit: Payer: Medicare Other

## 2020-09-12 ENCOUNTER — Other Ambulatory Visit: Payer: Self-pay

## 2020-09-12 DIAGNOSIS — R2681 Unsteadiness on feet: Secondary | ICD-10-CM

## 2020-09-12 DIAGNOSIS — M6281 Muscle weakness (generalized): Secondary | ICD-10-CM | POA: Diagnosis not present

## 2020-09-12 DIAGNOSIS — R2689 Other abnormalities of gait and mobility: Secondary | ICD-10-CM | POA: Diagnosis not present

## 2020-09-12 DIAGNOSIS — R42 Dizziness and giddiness: Secondary | ICD-10-CM | POA: Diagnosis not present

## 2020-09-12 DIAGNOSIS — R262 Difficulty in walking, not elsewhere classified: Secondary | ICD-10-CM | POA: Diagnosis not present

## 2020-09-12 DIAGNOSIS — H8111 Benign paroxysmal vertigo, right ear: Secondary | ICD-10-CM | POA: Diagnosis not present
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
## 2020-09-17 ENCOUNTER — Other Ambulatory Visit: Payer: Self-pay

## 2020-09-17 ENCOUNTER — Ambulatory Visit: Payer: Medicare Other

## 2020-09-17 DIAGNOSIS — R2681 Unsteadiness on feet: Secondary | ICD-10-CM

## 2020-09-17 DIAGNOSIS — R2689 Other abnormalities of gait and mobility: Secondary | ICD-10-CM

## 2020-09-17 DIAGNOSIS — R42 Dizziness and giddiness: Secondary | ICD-10-CM | POA: Diagnosis not present

## 2020-09-17 DIAGNOSIS — R262 Difficulty in walking, not elsewhere classified: Secondary | ICD-10-CM | POA: Diagnosis not present

## 2020-09-17 DIAGNOSIS — H8111 Benign paroxysmal vertigo, right ear: Secondary | ICD-10-CM | POA: Diagnosis not present

## 2020-09-17 DIAGNOSIS — M6281 Muscle weakness (generalized): Secondary | ICD-10-CM | POA: Diagnosis not present
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
## 2020-09-18 DIAGNOSIS — R5383 Other fatigue: Secondary | ICD-10-CM | POA: Diagnosis not present

## 2020-09-18 DIAGNOSIS — F429 Obsessive-compulsive disorder, unspecified: Secondary | ICD-10-CM | POA: Diagnosis not present

## 2020-09-18 DIAGNOSIS — R32 Unspecified urinary incontinence: Secondary | ICD-10-CM | POA: Diagnosis not present

## 2020-09-18 DIAGNOSIS — Z7984 Long term (current) use of oral hypoglycemic drugs: Secondary | ICD-10-CM | POA: Diagnosis not present

## 2020-09-18 DIAGNOSIS — E782 Mixed hyperlipidemia: Secondary | ICD-10-CM | POA: Diagnosis not present

## 2020-09-18 DIAGNOSIS — R269 Unspecified abnormalities of gait and mobility: Secondary | ICD-10-CM | POA: Diagnosis not present

## 2020-09-18 DIAGNOSIS — R413 Other amnesia: Secondary | ICD-10-CM | POA: Diagnosis not present

## 2020-09-18 DIAGNOSIS — F324 Major depressive disorder, single episode, in partial remission: Secondary | ICD-10-CM | POA: Diagnosis not present

## 2020-09-18 DIAGNOSIS — E119 Type 2 diabetes mellitus without complications: Secondary | ICD-10-CM | POA: Diagnosis not present

## 2020-09-18 DIAGNOSIS — M48061 Spinal stenosis, lumbar region without neurogenic claudication: Secondary | ICD-10-CM | POA: Diagnosis not present

## 2020-09-18 DIAGNOSIS — E611 Iron deficiency: Secondary | ICD-10-CM | POA: Diagnosis not present

## 2020-09-24 ENCOUNTER — Other Ambulatory Visit: Payer: Self-pay

## 2020-09-24 ENCOUNTER — Ambulatory Visit: Payer: Medicare Other | Attending: Neurology

## 2020-09-24 DIAGNOSIS — R2681 Unsteadiness on feet: Secondary | ICD-10-CM | POA: Diagnosis not present

## 2020-09-24 DIAGNOSIS — R262 Difficulty in walking, not elsewhere classified: Secondary | ICD-10-CM | POA: Insufficient documentation

## 2020-09-24 DIAGNOSIS — R2689 Other abnormalities of gait and mobility: Secondary | ICD-10-CM | POA: Diagnosis not present

## 2020-09-24 DIAGNOSIS — M6281 Muscle weakness (generalized): Secondary | ICD-10-CM | POA: Diagnosis not present
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
## 2020-09-25 ENCOUNTER — Ambulatory Visit: Payer: Medicare Other

## 2020-09-30 ENCOUNTER — Telehealth: Payer: Self-pay

## 2020-09-30 ENCOUNTER — Ambulatory Visit: Payer: Medicare Other

## 2020-09-30 ENCOUNTER — Other Ambulatory Visit: Payer: Self-pay

## 2020-09-30 DIAGNOSIS — R2681 Unsteadiness on feet: Secondary | ICD-10-CM

## 2020-09-30 DIAGNOSIS — R262 Difficulty in walking, not elsewhere classified: Secondary | ICD-10-CM

## 2020-09-30 DIAGNOSIS — R2689 Other abnormalities of gait and mobility: Secondary | ICD-10-CM

## 2020-09-30 DIAGNOSIS — M6281 Muscle weakness (generalized): Secondary | ICD-10-CM | POA: Diagnosis not present
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
## 2020-09-30 NOTE — Telephone Encounter (Signed)
Ellwood Dense, PA-C,   Jacob Dalton was being treated by Neuro PT services for Balance Disorder. Patient was discharged today due to improvements in balance and ability to meet all LTGs at this time. However, patient continues to experience increased knee pain limiting functional activities. Patient would like to further address the knee pain, and was interested in PT services at [place] at [facility] . If you agree, please place a referral for  to [facility] or Fax Order to [phone].   Thank you,  Guillermina [place], PT, [facility] [address] [place], [place]  [address] Phone:  [phone] Fax:  [phone]   Faxed to Office of Ellwood Dense, [place] on 09/30/20.

## 2020-10-06 DIAGNOSIS — M461 Sacroiliitis, not elsewhere classified: Secondary | ICD-10-CM | POA: Diagnosis not present

## 2020-10-06 DIAGNOSIS — M545 Low back pain, unspecified: Secondary | ICD-10-CM | POA: Diagnosis not present

## 2020-10-09 ENCOUNTER — Ambulatory Visit: Payer: Medicare Other

## 2020-10-16 ENCOUNTER — Ambulatory Visit: Payer: Medicare Other

## 2021-01-08 DIAGNOSIS — G5793 Unspecified mononeuropathy of bilateral lower limbs: Secondary | ICD-10-CM | POA: Diagnosis not present

## 2021-01-08 DIAGNOSIS — R269 Unspecified abnormalities of gait and mobility: Secondary | ICD-10-CM | POA: Diagnosis not present

## 2021-01-08 DIAGNOSIS — G3184 Mild cognitive impairment, so stated: Secondary | ICD-10-CM | POA: Diagnosis not present

## 2021-01-08 DIAGNOSIS — Z885 Allergy status to narcotic agent status: Secondary | ICD-10-CM | POA: Diagnosis not present

## 2021-01-08 DIAGNOSIS — Z888 Allergy status to other drugs, medicaments and biological substances status: Secondary | ICD-10-CM | POA: Diagnosis not present

## 2021-01-08 DIAGNOSIS — Z886 Allergy status to analgesic agent status: Secondary | ICD-10-CM | POA: Diagnosis not present
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
## 2021-01-29 ENCOUNTER — Encounter: Payer: Self-pay | Admitting: Medical

## 2021-01-29 ENCOUNTER — Ambulatory Visit (INDEPENDENT_AMBULATORY_CARE_PROVIDER_SITE_OTHER): Payer: Medicare Other | Admitting: Medical

## 2021-01-29 VITALS — BP 136/64 | HR 75 | Wt 156.4 lb

## 2021-01-29 DIAGNOSIS — I10 Essential (primary) hypertension: Secondary | ICD-10-CM

## 2021-01-29 DIAGNOSIS — I739 Peripheral vascular disease, unspecified: Secondary | ICD-10-CM | POA: Diagnosis not present

## 2021-01-29 DIAGNOSIS — E119 Type 2 diabetes mellitus without complications: Secondary | ICD-10-CM

## 2021-01-29 DIAGNOSIS — N529 Male erectile dysfunction, unspecified: Secondary | ICD-10-CM | POA: Diagnosis not present

## 2021-01-29 DIAGNOSIS — I2510 Atherosclerotic heart disease of native coronary artery without angina pectoris: Secondary | ICD-10-CM

## 2021-01-29 DIAGNOSIS — E785 Hyperlipidemia, unspecified: Secondary | ICD-10-CM

## 2021-01-29 DIAGNOSIS — R3915 Urgency of urination: Secondary | ICD-10-CM | POA: Diagnosis not present

## 2021-01-29 DIAGNOSIS — N138 Other obstructive and reflux uropathy: Secondary | ICD-10-CM | POA: Diagnosis not present

## 2021-01-29 DIAGNOSIS — R350 Frequency of micturition: Secondary | ICD-10-CM | POA: Diagnosis not present

## 2021-01-29 DIAGNOSIS — N401 Enlarged prostate with lower urinary tract symptoms: Secondary | ICD-10-CM | POA: Diagnosis not present

## 2021-01-29 NOTE — Patient Instructions (Signed)
Medication Instructions:  No Changes *If you need a refill on your cardiac medications before your next appointment, please call your pharmacy*   Lab Work: No Labs If you have labs (blood work) drawn today and your tests are completely normal, you will receive your results only by: [place] (if you have MyChart) OR A paper copy in the mail If you have any lab test that is abnormal or we need to change your treatment, we will call you to review the results.   Testing/Procedures: No Testing   Follow-Up: At [facility], you and your health needs are our priority.  As part of our continuing mission to provide you with exceptional heart care, we have created designated Provider Care Teams.  These Care Teams include your primary Cardiologist (physician) and Advanced Practice Providers (APPs -  Physician Assistants and Nurse Practitioners) who all work together to provide you with the care you need, when you need it.  We recommend signing up for the patient portal called "MyChart".  Sign up information is provided on this After Visit Summary.  MyChart is used to connect with patients for Virtual Visits (Telemedicine).  Patients are able to view lab/test results, encounter notes, upcoming appointments, etc.  Non-urgent messages can be sent to your provider as well.   To learn more about what you can do with MyChart, go to [email or web address].    Your next appointment:   1 year(s)  The format for your next appointment:   In Person  Provider:   Glenetta Hew, MD

## 2021-02-02 ENCOUNTER — Emergency Department (HOSPITAL_COMMUNITY)
Admission: EM | Admit: 2021-02-02 | Discharge: 2021-02-02 | Disposition: A | Discharge status: Home / Self Care | Payer: Medicare Other | Attending: Emergency Medicine | Admitting: Emergency Medicine

## 2021-02-02 ENCOUNTER — Other Ambulatory Visit: Payer: Self-pay

## 2021-02-02 ENCOUNTER — Emergency Department (HOSPITAL_COMMUNITY): Payer: Medicare Other

## 2021-02-02 DIAGNOSIS — I10 Essential (primary) hypertension: Secondary | ICD-10-CM | POA: Insufficient documentation

## 2021-02-02 DIAGNOSIS — Z87891 Personal history of nicotine dependence: Secondary | ICD-10-CM | POA: Diagnosis not present

## 2021-02-02 DIAGNOSIS — Z7902 Long term (current) use of antithrombotics/antiplatelets: Secondary | ICD-10-CM | POA: Insufficient documentation

## 2021-02-02 DIAGNOSIS — K219 Gastro-esophageal reflux disease without esophagitis: Secondary | ICD-10-CM | POA: Diagnosis not present

## 2021-02-02 DIAGNOSIS — E1142 Type 2 diabetes mellitus with diabetic polyneuropathy: Secondary | ICD-10-CM | POA: Insufficient documentation

## 2021-02-02 DIAGNOSIS — R0789 Other chest pain: Secondary | ICD-10-CM | POA: Diagnosis not present

## 2021-02-02 DIAGNOSIS — Z79899 Other long term (current) drug therapy: Secondary | ICD-10-CM | POA: Diagnosis not present

## 2021-02-02 DIAGNOSIS — R079 Chest pain, unspecified: Secondary | ICD-10-CM

## 2021-02-02 DIAGNOSIS — I2510 Atherosclerotic heart disease of native coronary artery without angina pectoris: Secondary | ICD-10-CM | POA: Insufficient documentation

## 2021-02-02 DIAGNOSIS — Z7984 Long term (current) use of oral hypoglycemic drugs: Secondary | ICD-10-CM | POA: Diagnosis not present

## 2021-02-02 LAB — CBC
HCT: 40 % (ref 39.0–52.0)
Hemoglobin: 13 g/dL (ref 13.0–17.0)
MCH: 26.3 pg (ref 26.0–34.0)
MCHC: 32.5 g/dL (ref 30.0–36.0)
MCV: 80.8 fL (ref 80.0–100.0)
Platelets: 172 10*3/uL (ref 150–400)
RBC: 4.95 MIL/uL (ref 4.22–5.81)
RDW: 14.3 % (ref 11.5–15.5)
WBC: 5.5 10*3/uL (ref 4.0–10.5)
nRBC: 0 % (ref 0.0–0.2)

## 2021-02-02 LAB — BASIC METABOLIC PANEL
Anion gap: 10 (ref 5–15)
BUN: 16 mg/dL (ref 8–23)
CO2: 24 mmol/L (ref 22–32)
Calcium: 9.2 mg/dL (ref 8.9–10.3)
Chloride: 99 mmol/L (ref 98–111)
Creatinine, Ser: 1.14 mg/dL (ref 0.61–1.24)
GFR, Estimated: >60 mL/min (ref >60)
Glucose, Bld: 118 mg/dL — ABNORMAL HIGH (ref 70–99)
Potassium: 4.1 mmol/L (ref 3.5–5.1)
Sodium: 133 mmol/L — ABNORMAL LOW (ref 135–145)

## 2021-02-02 LAB — TROPONIN I (HIGH SENSITIVITY)
Troponin I (High Sensitivity): 4 ng/L (ref <18)
Troponin I (High Sensitivity): 4 ng/L (ref <18)

## 2021-02-02 MED ORDER — ASPIRIN 81 MG PO CHEW
324.0000 mg | CHEWABLE_TABLET | Freq: Once | ORAL | Status: AC
  Administered 2021-02-02: 324 mg via ORAL
  Filled 2021-02-02: qty 4

## 2021-02-02 NOTE — ED Triage Notes (Signed)
Pt reports intermittent chest pain today after going on a walk. Hx MI and stent placement. Took one nitro with some relief. Endorses dizziness, but states that has been going on for a few months.
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
## 2021-02-02 NOTE — Discharge Instructions (Addendum)
Your cardiac work-up here was reassuring.  Your EKG was normal and both of your troponin values were normal.  Please follow-up with your primary care doctor and let them know that you are experiencing the symptoms.  Please do not hesitate to return to the emergency department if your chest pain significantly changes or worsens.

## 2021-02-02 NOTE — ED Provider Notes (Signed)
Emergency Medicine Provider Triage Evaluation Note  Jacob Dalton , a 75 y.o. male  was evaluated in triage.  Pt complains of chest pain that has been ongoing intermittently today.  Review of Systems  Positive: Chest pain Negative: Sob, diaphoresis, or nausea  Physical Exam  BP 139/89 (BP Location: Right Arm)   Pulse 81   Temp 98.5 F (36.9 C) (Oral)   Resp 16   SpO2 98%  Gen:   Awake, no distress   Resp:  Normal effort  MSK:   Moves extremities without difficulty  Other:  Heart with rrr, lungs ctab  Medical Decision Making  Medically screening exam initiated at 5:44 PM.  Appropriate orders placed.  PRINCETIN BIHL was informed that the remainder of the evaluation will be completed by another provider, this initial triage assessment does not replace that evaluation, and the importance of remaining in the ED until their evaluation is complete.     Rodney Booze, PA-C 02/02/21 [place], [place], DO 02/02/21 1947

## 2021-02-02 NOTE — ED Notes (Signed)
Patient verbalizes understanding of d/c instructions. VSS. Ambulates with a even and steady gait.

## 2021-03-17 DIAGNOSIS — Z23 Encounter for immunization: Secondary | ICD-10-CM | POA: Diagnosis not present

## 2021-03-17 DIAGNOSIS — Z0000 Encounter for general adult medical examination without abnormal findings: Secondary | ICD-10-CM | POA: Diagnosis not present

## 2021-03-17 DIAGNOSIS — R159 Full incontinence of feces: Secondary | ICD-10-CM | POA: Diagnosis not present

## 2021-03-17 DIAGNOSIS — R5383 Other fatigue: Secondary | ICD-10-CM | POA: Diagnosis not present

## 2021-03-17 DIAGNOSIS — R269 Unspecified abnormalities of gait and mobility: Secondary | ICD-10-CM | POA: Diagnosis not present

## 2021-03-17 DIAGNOSIS — E611 Iron deficiency: Secondary | ICD-10-CM | POA: Diagnosis not present

## 2021-03-17 DIAGNOSIS — R7309 Other abnormal glucose: Secondary | ICD-10-CM | POA: Diagnosis not present

## 2021-03-17 DIAGNOSIS — R413 Other amnesia: Secondary | ICD-10-CM | POA: Diagnosis not present

## 2021-03-17 DIAGNOSIS — F324 Major depressive disorder, single episode, in partial remission: Secondary | ICD-10-CM | POA: Diagnosis not present

## 2021-03-17 DIAGNOSIS — F429 Obsessive-compulsive disorder, unspecified: Secondary | ICD-10-CM | POA: Diagnosis not present

## 2021-03-17 DIAGNOSIS — E782 Mixed hyperlipidemia: Secondary | ICD-10-CM | POA: Diagnosis not present

## 2021-03-17 DIAGNOSIS — M545 Low back pain, unspecified: Secondary | ICD-10-CM | POA: Diagnosis not present

## 2021-03-17 DIAGNOSIS — E559 Vitamin D deficiency, unspecified: Secondary | ICD-10-CM | POA: Diagnosis not present

## 2021-03-17 DIAGNOSIS — R32 Unspecified urinary incontinence: Secondary | ICD-10-CM | POA: Diagnosis not present

## 2021-03-17 DIAGNOSIS — E1140 Type 2 diabetes mellitus with diabetic neuropathy, unspecified: Secondary | ICD-10-CM | POA: Diagnosis not present

## 2021-04-22 DIAGNOSIS — M5416 Radiculopathy, lumbar region: Secondary | ICD-10-CM | POA: Diagnosis not present

## 2021-04-24 ENCOUNTER — Other Ambulatory Visit: Payer: Self-pay | Admitting: Physician Assistant

## 2021-04-24 MED ORDER — CLOPIDOGREL BISULFATE 75 MG PO TABS: 75.0000 mg | ORAL_TABLET | Freq: Every evening | ORAL | 1 refills | Status: DC

## 2021-04-28 DIAGNOSIS — R159 Full incontinence of feces: Secondary | ICD-10-CM | POA: Diagnosis not present

## 2021-04-28 DIAGNOSIS — M5416 Radiculopathy, lumbar region: Secondary | ICD-10-CM | POA: Diagnosis not present

## 2021-04-28 DIAGNOSIS — G4719 Other hypersomnia: Secondary | ICD-10-CM | POA: Diagnosis not present

## 2021-04-28 DIAGNOSIS — E119 Type 2 diabetes mellitus without complications: Secondary | ICD-10-CM | POA: Diagnosis not present

## 2021-04-30 DIAGNOSIS — M5416 Radiculopathy, lumbar region: Secondary | ICD-10-CM | POA: Diagnosis not present

## 2021-05-05 DIAGNOSIS — Z87891 Personal history of nicotine dependence: Secondary | ICD-10-CM | POA: Diagnosis not present

## 2021-05-05 DIAGNOSIS — J384 Edema of larynx: Secondary | ICD-10-CM | POA: Diagnosis not present

## 2021-05-05 DIAGNOSIS — R0989 Other specified symptoms and signs involving the circulatory and respiratory systems: Secondary | ICD-10-CM | POA: Diagnosis not present

## 2021-05-05 DIAGNOSIS — J383 Other diseases of vocal cords: Secondary | ICD-10-CM | POA: Diagnosis not present

## 2021-05-06 DIAGNOSIS — M5416 Radiculopathy, lumbar region: Secondary | ICD-10-CM | POA: Diagnosis not present

## 2021-05-08 DIAGNOSIS — M5416 Radiculopathy, lumbar region: Secondary | ICD-10-CM | POA: Diagnosis not present

## 2021-05-13 DIAGNOSIS — M5416 Radiculopathy, lumbar region: Secondary | ICD-10-CM | POA: Diagnosis not present

## 2021-05-19 DIAGNOSIS — L570 Actinic keratosis: Secondary | ICD-10-CM | POA: Diagnosis not present

## 2021-05-19 DIAGNOSIS — Z85828 Personal history of other malignant neoplasm of skin: Secondary | ICD-10-CM | POA: Diagnosis not present

## 2021-05-19 DIAGNOSIS — L821 Other seborrheic keratosis: Secondary | ICD-10-CM | POA: Diagnosis not present

## 2021-05-19 DIAGNOSIS — M5416 Radiculopathy, lumbar region: Secondary | ICD-10-CM | POA: Diagnosis not present

## 2021-05-19 DIAGNOSIS — L309 Dermatitis, unspecified: Secondary | ICD-10-CM | POA: Diagnosis not present

## 2021-05-22 DIAGNOSIS — M5416 Radiculopathy, lumbar region: Secondary | ICD-10-CM | POA: Diagnosis not present

## 2021-05-28 DIAGNOSIS — M5416 Radiculopathy, lumbar region: Secondary | ICD-10-CM | POA: Diagnosis not present

## 2021-06-04 DIAGNOSIS — M5416 Radiculopathy, lumbar region: Secondary | ICD-10-CM | POA: Diagnosis not present

## 2021-06-10 DIAGNOSIS — H26492 Other secondary cataract, left eye: Secondary | ICD-10-CM | POA: Diagnosis not present

## 2021-06-10 DIAGNOSIS — Z9842 Cataract extraction status, left eye: Secondary | ICD-10-CM | POA: Diagnosis not present

## 2021-06-10 DIAGNOSIS — H2511 Age-related nuclear cataract, right eye: Secondary | ICD-10-CM | POA: Diagnosis not present

## 2021-06-12 DIAGNOSIS — M5416 Radiculopathy, lumbar region: Secondary | ICD-10-CM | POA: Diagnosis not present

## 2021-06-24 DIAGNOSIS — G4719 Other hypersomnia: Secondary | ICD-10-CM | POA: Diagnosis not present

## 2021-07-13 DIAGNOSIS — J385 Laryngeal spasm: Secondary | ICD-10-CM | POA: Diagnosis not present

## 2021-07-13 DIAGNOSIS — J383 Other diseases of vocal cords: Secondary | ICD-10-CM | POA: Diagnosis not present

## 2021-07-13 DIAGNOSIS — R490 Dysphonia: Secondary | ICD-10-CM | POA: Diagnosis not present

## 2021-07-15 DIAGNOSIS — G4733 Obstructive sleep apnea (adult) (pediatric): Secondary | ICD-10-CM | POA: Diagnosis not present

## 2021-07-17 ENCOUNTER — Other Ambulatory Visit: Payer: Self-pay | Admitting: Cardiology

## 2021-07-17 DIAGNOSIS — M5416 Radiculopathy, lumbar region: Secondary | ICD-10-CM | POA: Diagnosis not present

## 2021-07-21 DIAGNOSIS — J383 Other diseases of vocal cords: Secondary | ICD-10-CM | POA: Diagnosis not present

## 2021-07-21 DIAGNOSIS — G4733 Obstructive sleep apnea (adult) (pediatric): Secondary | ICD-10-CM | POA: Diagnosis not present

## 2021-07-21 DIAGNOSIS — J385 Laryngeal spasm: Secondary | ICD-10-CM | POA: Diagnosis not present

## 2021-07-21 DIAGNOSIS — R490 Dysphonia: Secondary | ICD-10-CM | POA: Diagnosis not present

## 2021-07-22 DIAGNOSIS — M5416 Radiculopathy, lumbar region: Secondary | ICD-10-CM | POA: Diagnosis not present

## 2021-07-24 DIAGNOSIS — M5416 Radiculopathy, lumbar region: Secondary | ICD-10-CM | POA: Diagnosis not present

## 2021-07-27 DIAGNOSIS — M5416 Radiculopathy, lumbar region: Secondary | ICD-10-CM | POA: Diagnosis not present

## 2021-07-28 DIAGNOSIS — R490 Dysphonia: Secondary | ICD-10-CM | POA: Diagnosis not present

## 2021-07-28 DIAGNOSIS — J385 Laryngeal spasm: Secondary | ICD-10-CM | POA: Diagnosis not present

## 2021-07-28 DIAGNOSIS — J383 Other diseases of vocal cords: Secondary | ICD-10-CM | POA: Diagnosis not present

## 2021-08-04 DIAGNOSIS — R490 Dysphonia: Secondary | ICD-10-CM | POA: Diagnosis not present

## 2021-08-04 DIAGNOSIS — J385 Laryngeal spasm: Secondary | ICD-10-CM | POA: Diagnosis not present

## 2021-08-04 DIAGNOSIS — J383 Other diseases of vocal cords: Secondary | ICD-10-CM | POA: Diagnosis not present

## 2021-08-04 DIAGNOSIS — M5416 Radiculopathy, lumbar region: Secondary | ICD-10-CM | POA: Diagnosis not present

## 2021-08-05 DIAGNOSIS — Z7984 Long term (current) use of oral hypoglycemic drugs: Secondary | ICD-10-CM | POA: Diagnosis not present

## 2021-08-05 DIAGNOSIS — M545 Low back pain, unspecified: Secondary | ICD-10-CM | POA: Diagnosis not present

## 2021-08-05 DIAGNOSIS — E119 Type 2 diabetes mellitus without complications: Secondary | ICD-10-CM | POA: Diagnosis not present

## 2021-08-05 DIAGNOSIS — K5900 Constipation, unspecified: Secondary | ICD-10-CM | POA: Diagnosis not present

## 2021-08-05 DIAGNOSIS — R159 Full incontinence of feces: Secondary | ICD-10-CM | POA: Diagnosis not present

## 2021-08-05 DIAGNOSIS — G4733 Obstructive sleep apnea (adult) (pediatric): Secondary | ICD-10-CM | POA: Diagnosis not present

## 2021-08-06 DIAGNOSIS — M5416 Radiculopathy, lumbar region: Secondary | ICD-10-CM | POA: Diagnosis not present

## 2021-08-11 DIAGNOSIS — M5416 Radiculopathy, lumbar region: Secondary | ICD-10-CM | POA: Diagnosis not present

## 2021-08-17 DIAGNOSIS — M5416 Radiculopathy, lumbar region: Secondary | ICD-10-CM | POA: Diagnosis not present

## 2021-09-03 DIAGNOSIS — N529 Male erectile dysfunction, unspecified: Secondary | ICD-10-CM | POA: Diagnosis not present

## 2021-09-03 DIAGNOSIS — N401 Enlarged prostate with lower urinary tract symptoms: Secondary | ICD-10-CM | POA: Diagnosis not present

## 2021-09-03 DIAGNOSIS — N138 Other obstructive and reflux uropathy: Secondary | ICD-10-CM | POA: Diagnosis not present

## 2021-10-19 DIAGNOSIS — G4733 Obstructive sleep apnea (adult) (pediatric): Secondary | ICD-10-CM | POA: Diagnosis not present

## 2021-11-10 DIAGNOSIS — L539 Erythematous condition, unspecified: Secondary | ICD-10-CM | POA: Diagnosis not present

## 2021-11-10 DIAGNOSIS — J384 Edema of larynx: Secondary | ICD-10-CM | POA: Diagnosis not present

## 2021-11-10 DIAGNOSIS — J387 Other diseases of larynx: Secondary | ICD-10-CM | POA: Diagnosis not present

## 2021-11-10 DIAGNOSIS — J383 Other diseases of vocal cords: Secondary | ICD-10-CM | POA: Diagnosis not present

## 2021-12-11 DIAGNOSIS — G4733 Obstructive sleep apnea (adult) (pediatric): Secondary | ICD-10-CM | POA: Diagnosis not present

## 2021-12-15 DIAGNOSIS — Z9181 History of falling: Secondary | ICD-10-CM | POA: Diagnosis not present

## 2021-12-15 DIAGNOSIS — R2689 Other abnormalities of gait and mobility: Secondary | ICD-10-CM | POA: Diagnosis not present

## 2021-12-15 DIAGNOSIS — R4789 Other speech disturbances: Secondary | ICD-10-CM | POA: Diagnosis not present

## 2021-12-15 DIAGNOSIS — M48062 Spinal stenosis, lumbar region with neurogenic claudication: Secondary | ICD-10-CM | POA: Diagnosis not present

## 2021-12-15 DIAGNOSIS — G20 Parkinson's disease: Secondary | ICD-10-CM | POA: Diagnosis not present

## 2021-12-15 DIAGNOSIS — M4800 Spinal stenosis, site unspecified: Secondary | ICD-10-CM | POA: Diagnosis not present

## 2021-12-15 DIAGNOSIS — R258 Other abnormal involuntary movements: Secondary | ICD-10-CM | POA: Diagnosis not present

## 2022-01-13 DIAGNOSIS — G4733 Obstructive sleep apnea (adult) (pediatric): Secondary | ICD-10-CM | POA: Diagnosis not present

## 2022-01-18 ENCOUNTER — Ambulatory Visit: Payer: Self-pay

## 2022-01-18 ENCOUNTER — Telehealth: Payer: Self-pay

## 2022-01-18 NOTE — Patient Outreach (Signed)
  Care Coordination   Visit Note   01/18/2022 Name: CANYON LOHR MRN: [id number] DOB: 19-Dec-1945  SHAYLON ADEN is a 76 y.o. year old male who sees Josetta Huddle, MD for primary care. I spoke with  Wilmon Arms spouse [name] by phone today.  Follow up plan:  Anticipates being available to complete telephonic outreach on January 27, 2022. Agreed to call with questions if needed prior to the call.  Encounter Outcome:  Pt. Scheduled  [facility] [phone]

## 2022-01-18 NOTE — Patient Outreach (Signed)
  Care Management   Outreach Note  01/18/2022 Name: Jacob Dalton MRN: [id number] DOB: [birth year]  An unsuccessful telephone outreach was attempted today. The patient was referred to the case management team for assistance with care management and care coordination.   Follow Up Plan:  A HIPAA compliant voice message was left today requesting a return call.  [facility] [phone]

## 2022-01-19 ENCOUNTER — Other Ambulatory Visit: Payer: Self-pay | Admitting: Cardiology

## 2022-01-26 DIAGNOSIS — G20 Parkinson's disease: Secondary | ICD-10-CM | POA: Diagnosis not present

## 2022-01-27 ENCOUNTER — Ambulatory Visit: Payer: Self-pay

## 2022-01-27 NOTE — Patient Outreach (Signed)
  Care Coordination   Visit Note   01/27/2022 Name: Jacob Dalton MRN: [id number] DOB: 24-May-1946  Jacob Dalton is a 76 y.o. year old male who sees Josetta Huddle, MD for primary care. I spoke with  Wilmon Arms spouse Kermit Balo by phone today   Goals Addressed   None     SDOH assessments and interventions completed:  No    Care Coordination Interventions Activated:  No  Care Coordination Interventions:  No, not indicated  Will address during rescheduled visit.  Follow up plan:  Requested to reschedule for 02/03/22. Agreed to call if patient requires assistance prior to rescheduled outreach.    Encounter Outcome:  Pt. Scheduled   [facility] [phone]

## 2022-02-03 ENCOUNTER — Ambulatory Visit: Payer: Self-pay

## 2022-02-03 DIAGNOSIS — M545 Low back pain, unspecified: Secondary | ICD-10-CM | POA: Diagnosis not present

## 2022-02-03 NOTE — Patient Outreach (Signed)
  Care Coordination   02/03/2022 Name: Jacob Dalton MRN: [id number] DOB: 09-14-1945   Care Coordination Outreach Attempts:  An unsuccessful telephone outreach was attempted today to offer the patient information about available care coordination services as a benefit of their health plan.   Follow Up Plan:  Additional outreach attempts will be made to offer the patient care coordination information and services.   Encounter Outcome:  No Answer  Care Coordination Interventions Activated:  No   Care Coordination Interventions:  No, not indicated     [facility] [phone]

## 2022-03-05 IMAGING — RF DG MYELOGRAM LUMBAR
9 series · 9 of 9 positions shown · non-contrast
Comparison: Lumbar spine CT 10/16/2019

CLINICAL DATA: Gait difficulty.

EXAM:
LUMBAR MYELOGRAM
FLUOROSCOPY TIME:  Reference procedure note
PROCEDURE:
Lumbar puncture and intrathecal contrast administration were
performed by Dr Rudi who will separately report for the portion of
the procedure. I personally supervised acquisition of the myelogram
images.
TECHNIQUE: Contiguous axial images were obtained through the Lumbar spine after
the intrathecal infusion of infusion. Coronal and sagittal
reconstructions were obtained of the axial image sets.

[Series 1: cp_standard · 0.28mm/px · 1 of 1 slices shown (1 of 2)]
[im 1/1]
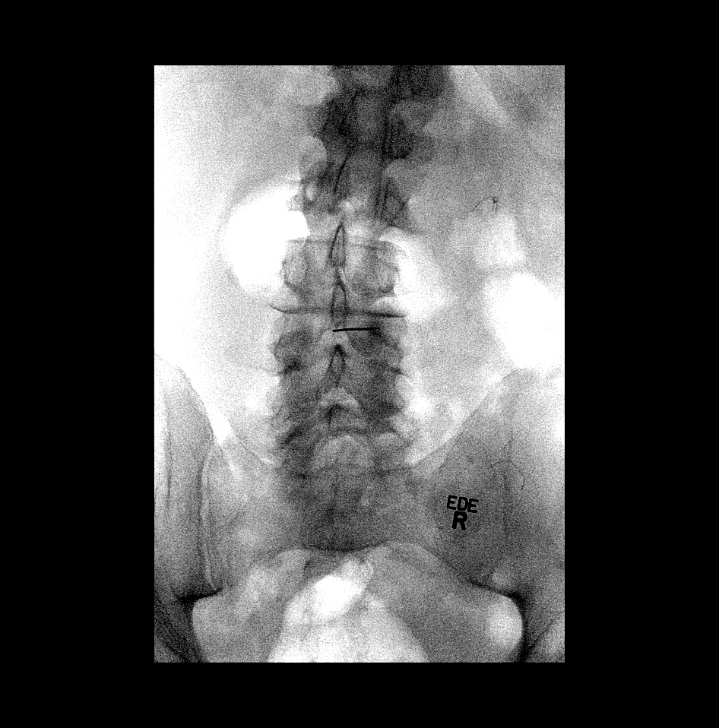

[Series 2: cp_standard · 0.26mm/px · 1 of 1 slices shown (2 of 2)]
[im 1/1]
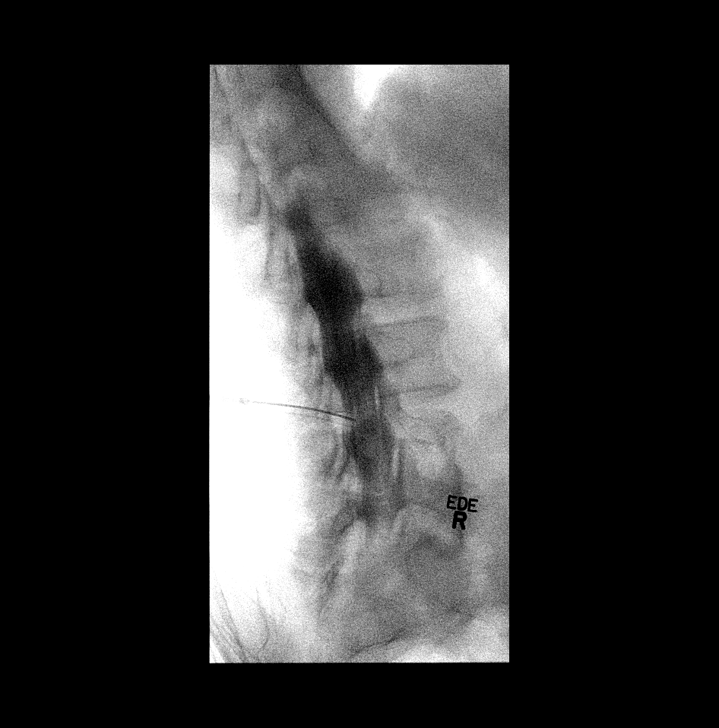

[Series 3: fluoro_myelogram_singleshot_bw · 0.17mm/px · 1 of 1 slices shown (1 of 7)]
[im 1/1]
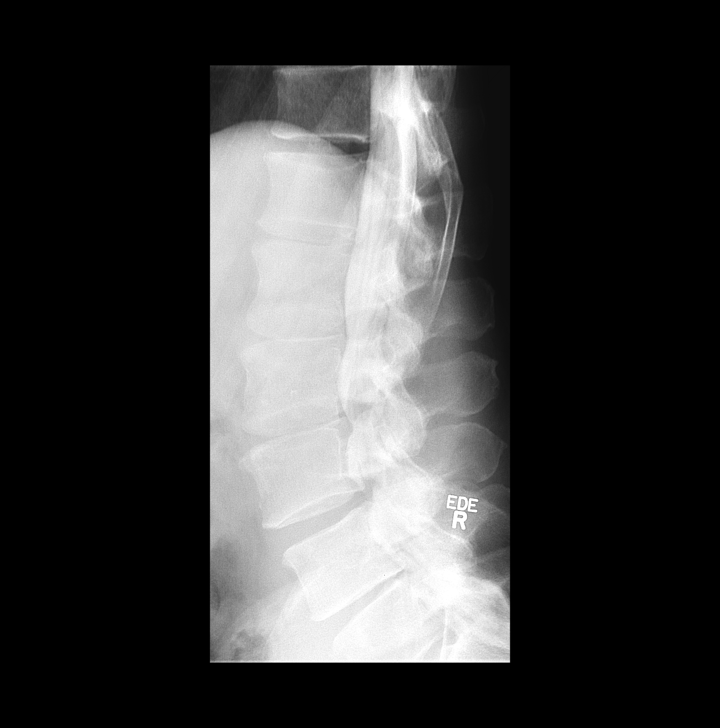

[Series 4: fluoro_myelogram_singleshot_bw · 0.17mm/px · 1 of 1 slices shown (2 of 7)]
[im 1/1]
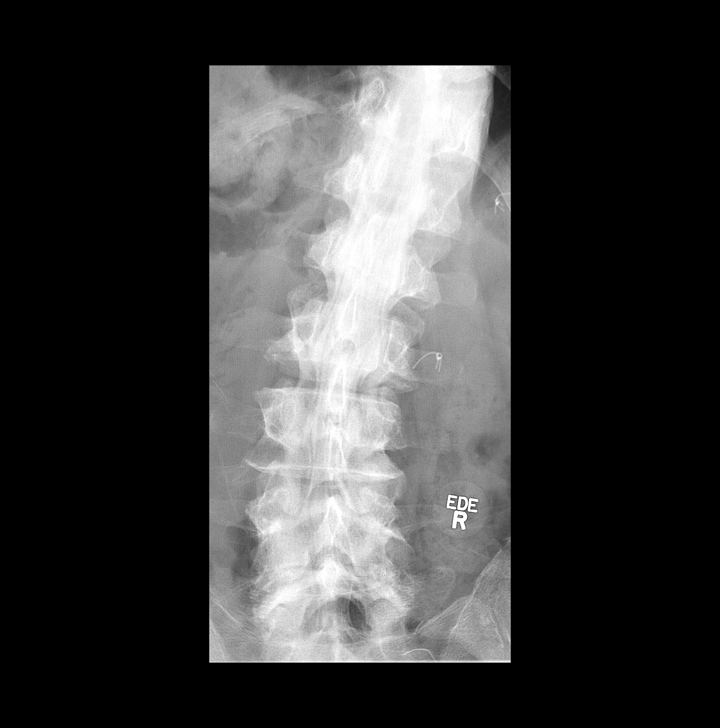

[Series 5: fluoro_myelogram_singleshot_bw · 0.17mm/px · 1 of 1 slices shown (3 of 7)]
[im 1/1]
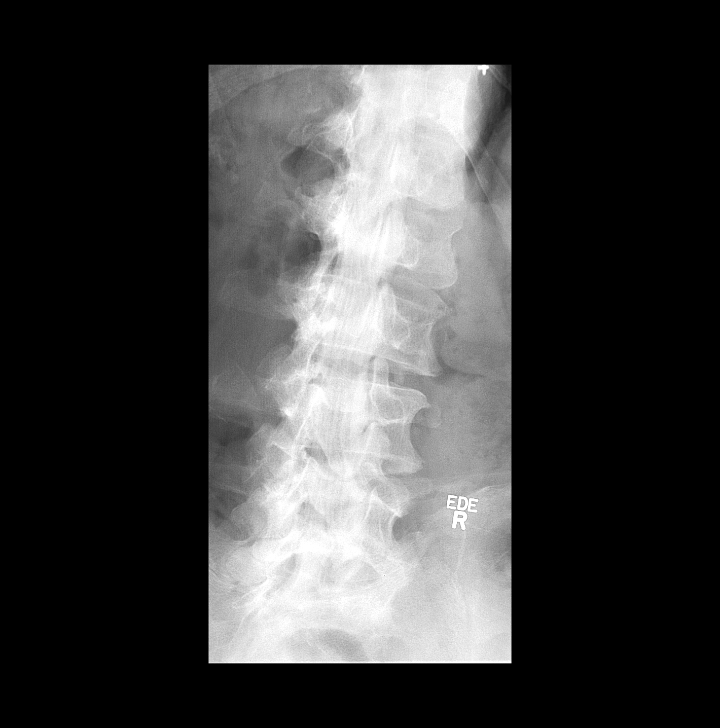

[Series 6: fluoro_myelogram_singleshot_bw · 0.17mm/px · 1 of 1 slices shown (4 of 7)]
[im 1/1]
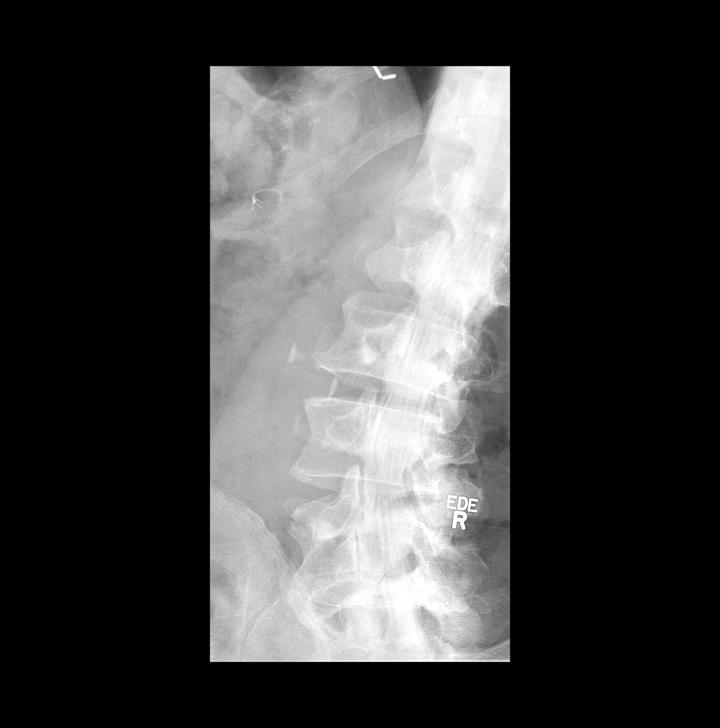

[Series 7: fluoro_myelogram_singleshot_bw · 0.17mm/px · 1 of 1 slices shown (5 of 7)]
[im 1/1]
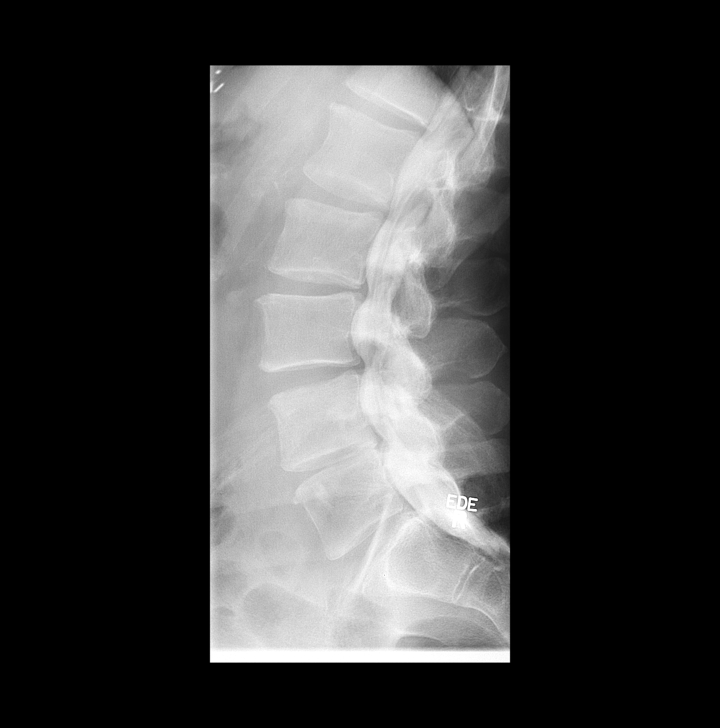

[Series 8: fluoro_myelogram_singleshot_bw · 0.17mm/px · 1 of 1 slices shown (6 of 7)]
[im 1/1]
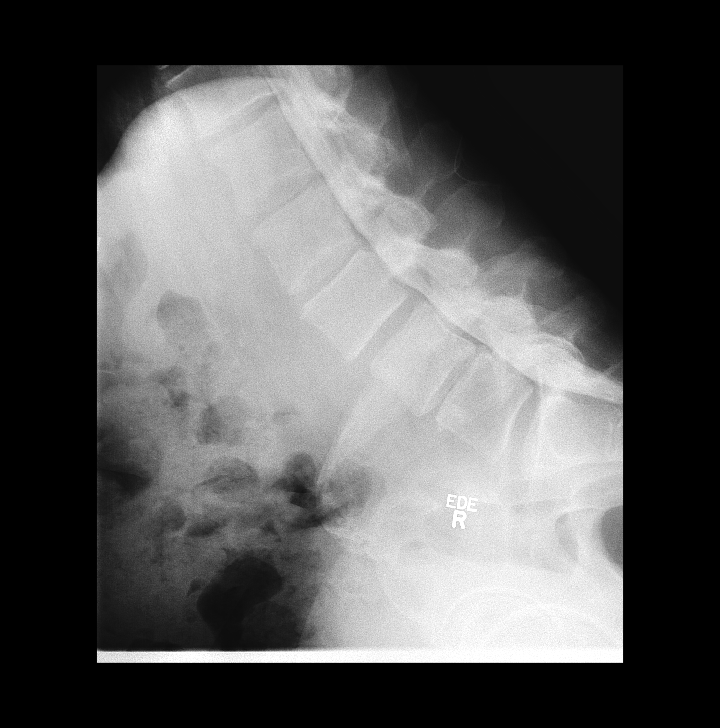

[Series 9: fluoro_myelogram_singleshot_bw · 0.17mm/px · 1 of 1 slices shown (7 of 7)]
[im 1/1]
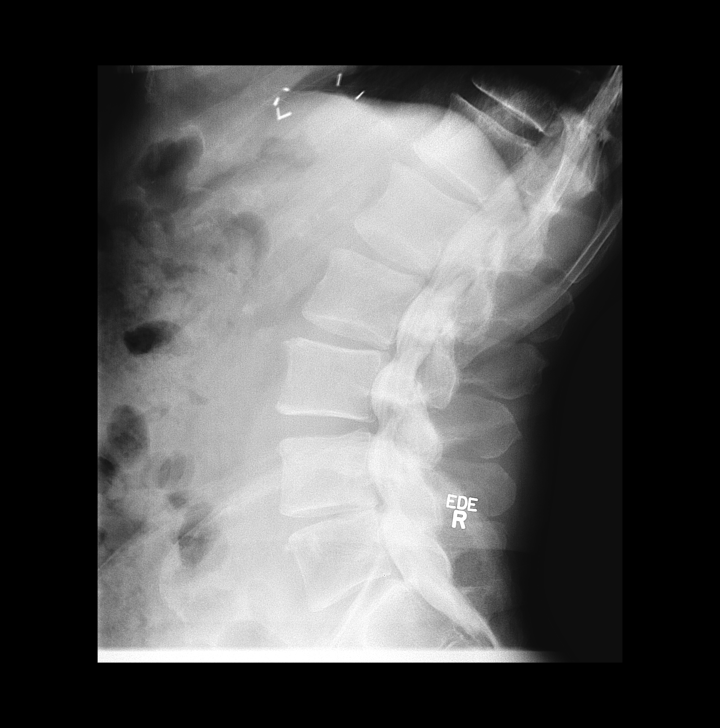

[9 of 9 positions shown; findings below may reference images not displayed]

FINDINGS: LUMBAR MYELOGRAM FINDINGS:

Transitional S1 vertebra as previously established. Mild
levoscoliosis. L4-5 spinal stenosis when standing and extension, at
least moderate, but nearly resolved when flexing. No intrathecal
filling defect or visible dilated vessel.

CT LUMBAR MYELOGRAM FINDINGS:

Segmentation: Transitional S1 vertebra.

Alignment: Levoscoliosis.

Vertebrae: Small scattered sclerotic structures are bone islands in
stable from 7929. There are a few subtle lucencies which are also
stable from 7929. No aggressive bone lesion. No fracture deformity.

Extra-spinal: Mild atheromatous calcification. Postoperative changes
at the GE junction.

Disc levels:

T12- L1: Unremarkable.

L1-L2: Unremarkable.

L2-L3: Unremarkable.

L3-L4: Disc narrowing and bulging. Right subarticular recess
narrowing with disc contacting the right L4 nerve root. Patent
foramina

L4-L5: Disc narrowing and bulging with moderate spinal stenosis.
Right more than left subarticular recess narrowing. Patent foramina.

L5-S1:Disc narrowing and bulging with endplate degeneration. Mild
facet spurring. Moderate thecal sac narrowing with left more than
right impingement on S1. Patent foramina
IMPRESSION: 1. Transitional S1 vertebra
2. Disc degeneration and mild scoliosis.
3. Moderate spinal stenosis at L4-5 and L5-S1 which improves with
flexion.
4. Subarticular recess narrowing on the right at L3-4, right more
than left at L4-5, and left more than right at L5-S1.

## 2022-03-18 DIAGNOSIS — Z1211 Encounter for screening for malignant neoplasm of colon: Secondary | ICD-10-CM | POA: Diagnosis not present

## 2022-03-18 DIAGNOSIS — E119 Type 2 diabetes mellitus without complications: Secondary | ICD-10-CM | POA: Diagnosis not present

## 2022-03-18 DIAGNOSIS — K219 Gastro-esophageal reflux disease without esophagitis: Secondary | ICD-10-CM | POA: Diagnosis not present

## 2022-03-18 DIAGNOSIS — G4733 Obstructive sleep apnea (adult) (pediatric): Secondary | ICD-10-CM | POA: Diagnosis not present

## 2022-03-18 DIAGNOSIS — I2510 Atherosclerotic heart disease of native coronary artery without angina pectoris: Secondary | ICD-10-CM | POA: Diagnosis not present

## 2022-03-18 DIAGNOSIS — E538 Deficiency of other specified B group vitamins: Secondary | ICD-10-CM | POA: Diagnosis not present

## 2022-03-18 DIAGNOSIS — E559 Vitamin D deficiency, unspecified: Secondary | ICD-10-CM | POA: Diagnosis not present

## 2022-03-18 DIAGNOSIS — Z0000 Encounter for general adult medical examination without abnormal findings: Secondary | ICD-10-CM | POA: Diagnosis not present

## 2022-03-18 DIAGNOSIS — F329 Major depressive disorder, single episode, unspecified: Secondary | ICD-10-CM | POA: Diagnosis not present

## 2022-03-18 DIAGNOSIS — D509 Iron deficiency anemia, unspecified: Secondary | ICD-10-CM | POA: Diagnosis not present

## 2022-03-24 DIAGNOSIS — G4733 Obstructive sleep apnea (adult) (pediatric): Secondary | ICD-10-CM | POA: Diagnosis not present

## 2022-04-08 ENCOUNTER — Other Ambulatory Visit: Payer: Self-pay | Admitting: Cardiology

## 2022-04-30 DIAGNOSIS — M545 Low back pain, unspecified: Secondary | ICD-10-CM | POA: Diagnosis not present

## 2022-05-17 DIAGNOSIS — L570 Actinic keratosis: Secondary | ICD-10-CM | POA: Diagnosis not present

## 2022-05-17 DIAGNOSIS — L814 Other melanin hyperpigmentation: Secondary | ICD-10-CM | POA: Diagnosis not present

## 2022-05-17 DIAGNOSIS — Z85828 Personal history of other malignant neoplasm of skin: Secondary | ICD-10-CM | POA: Diagnosis not present

## 2022-05-17 DIAGNOSIS — D1801 Hemangioma of skin and subcutaneous tissue: Secondary | ICD-10-CM | POA: Diagnosis not present

## 2022-05-17 DIAGNOSIS — C44612 Basal cell carcinoma of skin of right upper limb, including shoulder: Secondary | ICD-10-CM | POA: Diagnosis not present

## 2022-05-17 DIAGNOSIS — D2262 Melanocytic nevi of left upper limb, including shoulder: Secondary | ICD-10-CM | POA: Diagnosis not present

## 2022-05-17 DIAGNOSIS — L821 Other seborrheic keratosis: Secondary | ICD-10-CM | POA: Diagnosis not present

## 2022-05-18 DIAGNOSIS — M5451 Vertebrogenic low back pain: Secondary | ICD-10-CM | POA: Diagnosis not present

## 2022-05-18 DIAGNOSIS — M48062 Spinal stenosis, lumbar region with neurogenic claudication: Secondary | ICD-10-CM | POA: Diagnosis not present

## 2022-05-25 DIAGNOSIS — M5451 Vertebrogenic low back pain: Secondary | ICD-10-CM | POA: Diagnosis not present

## 2022-05-25 DIAGNOSIS — M48062 Spinal stenosis, lumbar region with neurogenic claudication: Secondary | ICD-10-CM | POA: Diagnosis not present

## 2022-06-02 DIAGNOSIS — M5451 Vertebrogenic low back pain: Secondary | ICD-10-CM | POA: Diagnosis not present

## 2022-06-02 DIAGNOSIS — M48062 Spinal stenosis, lumbar region with neurogenic claudication: Secondary | ICD-10-CM | POA: Diagnosis not present

## 2022-06-04 DIAGNOSIS — M5451 Vertebrogenic low back pain: Secondary | ICD-10-CM | POA: Diagnosis not present

## 2022-06-04 DIAGNOSIS — M48062 Spinal stenosis, lumbar region with neurogenic claudication: Secondary | ICD-10-CM | POA: Diagnosis not present

## 2022-06-08 DIAGNOSIS — M48062 Spinal stenosis, lumbar region with neurogenic claudication: Secondary | ICD-10-CM | POA: Diagnosis not present

## 2022-06-11 DIAGNOSIS — M5451 Vertebrogenic low back pain: Secondary | ICD-10-CM | POA: Diagnosis not present

## 2022-06-11 DIAGNOSIS — M48062 Spinal stenosis, lumbar region with neurogenic claudication: Secondary | ICD-10-CM | POA: Diagnosis not present

## 2022-06-17 DIAGNOSIS — M5451 Vertebrogenic low back pain: Secondary | ICD-10-CM | POA: Diagnosis not present

## 2022-06-17 DIAGNOSIS — M48062 Spinal stenosis, lumbar region with neurogenic claudication: Secondary | ICD-10-CM | POA: Diagnosis not present

## 2022-06-23 DIAGNOSIS — G4733 Obstructive sleep apnea (adult) (pediatric): Secondary | ICD-10-CM | POA: Diagnosis not present

## 2022-06-24 DIAGNOSIS — M5451 Vertebrogenic low back pain: Secondary | ICD-10-CM | POA: Diagnosis not present

## 2022-06-24 DIAGNOSIS — M48062 Spinal stenosis, lumbar region with neurogenic claudication: Secondary | ICD-10-CM | POA: Diagnosis not present

## 2022-06-30 DIAGNOSIS — M5451 Vertebrogenic low back pain: Secondary | ICD-10-CM | POA: Diagnosis not present

## 2022-06-30 DIAGNOSIS — M48062 Spinal stenosis, lumbar region with neurogenic claudication: Secondary | ICD-10-CM | POA: Diagnosis not present

## 2022-07-08 DIAGNOSIS — M48062 Spinal stenosis, lumbar region with neurogenic claudication: Secondary | ICD-10-CM | POA: Diagnosis not present

## 2022-07-08 DIAGNOSIS — M5451 Vertebrogenic low back pain: Secondary | ICD-10-CM | POA: Diagnosis not present

## 2022-07-12 DIAGNOSIS — M5451 Vertebrogenic low back pain: Secondary | ICD-10-CM | POA: Diagnosis not present

## 2022-07-12 DIAGNOSIS — M48062 Spinal stenosis, lumbar region with neurogenic claudication: Secondary | ICD-10-CM | POA: Diagnosis not present

## 2022-07-14 ENCOUNTER — Other Ambulatory Visit: Payer: Self-pay | Admitting: Physician Assistant

## 2022-07-14 ENCOUNTER — Telehealth: Payer: Self-pay

## 2022-07-14 DIAGNOSIS — M5451 Vertebrogenic low back pain: Secondary | ICD-10-CM | POA: Diagnosis not present

## 2022-07-14 DIAGNOSIS — M48062 Spinal stenosis, lumbar region with neurogenic claudication: Secondary | ICD-10-CM | POA: Diagnosis not present

## 2022-07-14 NOTE — Telephone Encounter (Signed)
   Pre-operative Risk Assessment    Patient Name: Jacob Dalton  DOB: 1946/02/19 MRN: [id number]      Request for Surgical Clearance    Procedure:   Lumbar SNRB   Date of Surgery:  Clearance 07/22/21                                 Surgeon:   Surgeon's Group or Practice Name:  [facility] Phone number:  [phone] Fax number:  [phone]   Type of Clearance Requested:   - Medical  - Pharmacy:  Hold Clopidogrel (Plavix) 7 days prior    Type of Anesthesia:  Not Indicated   Additional requests/questions:    Tana Conch   07/14/2022, 3:08 PM

## 2022-07-14 NOTE — Telephone Encounter (Signed)
   Name: Jacob Dalton  DOB: 02/29/1946  MRN: [id number]  Primary Cardiologist: Glenetta Hew, MD  Chart reviewed as part of pre-operative protocol coverage. Because of GABRIELLA GUILE past medical history and time since last visit, he will require a follow-up in-office visit in order to better assess preoperative cardiovascular risk.  Pre-op covering staff: - Please schedule appointment and call patient to inform them. If patient already had an upcoming appointment within acceptable timeframe, please add "pre-op clearance" to the appointment notes so provider is aware. - Please contact requesting surgeon's office via preferred method (i.e, phone, fax) to inform them of need for appointment prior to surgery.  Per office protocol, if patient is without any new symptoms or concerns at the time of their visit, he may hold Plavix for 5 days prior to procedure. Please resume Plavix as soon as possible postprocedure, at the discretion of the surgeon.    Lenna Sciara, NP  07/14/2022, 4:01 PM

## 2022-07-15 ENCOUNTER — Encounter: Payer: Self-pay | Admitting: Internal Medicine

## 2022-07-15 ENCOUNTER — Ambulatory Visit: Payer: Medicare Other | Attending: Internal Medicine | Admitting: Internal Medicine

## 2022-07-15 VITALS — BP 122/68 | HR 76 | Ht 70.0 in | Wt 164.2 lb

## 2022-07-15 DIAGNOSIS — E1169 Type 2 diabetes mellitus with other specified complication: Secondary | ICD-10-CM | POA: Insufficient documentation

## 2022-07-15 DIAGNOSIS — I2510 Atherosclerotic heart disease of native coronary artery without angina pectoris: Secondary | ICD-10-CM | POA: Diagnosis not present

## 2022-07-15 DIAGNOSIS — I700 Atherosclerosis of aorta: Secondary | ICD-10-CM | POA: Diagnosis not present

## 2022-07-15 DIAGNOSIS — E785 Hyperlipidemia, unspecified: Secondary | ICD-10-CM | POA: Diagnosis not present

## 2022-07-15 DIAGNOSIS — Z01810 Encounter for preprocedural cardiovascular examination: Secondary | ICD-10-CM | POA: Insufficient documentation

## 2022-07-15 DIAGNOSIS — I152 Hypertension secondary to endocrine disorders: Secondary | ICD-10-CM | POA: Diagnosis not present

## 2022-07-15 DIAGNOSIS — E1159 Type 2 diabetes mellitus with other circulatory complications: Secondary | ICD-10-CM | POA: Diagnosis not present

## 2022-07-15 DIAGNOSIS — E119 Type 2 diabetes mellitus without complications: Secondary | ICD-10-CM

## 2022-07-15 NOTE — Progress Notes (Signed)
Cardiology Office Note:    Date:  07/15/2022   ID:  Jacob, Dalton September 14, 1945, MRN [id number]  PCP:  Jacob Huddle, MD   [facility] Providers Cardiologist: Jacob Hew, MD Referring MD: Jacob Huddle, MD   Chief Complaint/Reason for Referral: DOD visit for preoperative cardiovascular assessment  ASSESSMENT:    1. Preoperative cardiovascular examination   2. Coronary artery disease involving native coronary artery of native heart without angina pectoris   3. Type 2 diabetes mellitus without complication, without long-term current use of insulin ([place])   4. Hypertension associated with diabetes ([place])   5. Hyperlipidemia associated with type 2 diabetes mellitus ([place])   6. Aortic atherosclerosis (H[facility])     PLAN:    In order of problems listed above: 1.  Preoperative cardiovascular assessment: The patient is undergoing a low risk procedure with epidural nerve block.  He is several years out from his stenting procedure.  Plavix can be held in preparation for the procedure and then resumed when possible. 2.  Coronary artery disease: He is having no signs or symptoms of angina but is relatively limited due to his orthopedic issues. 3.  Type 2 diabetes: Continue Plavix in lieu of aspirin, Crestor, and consider ACE/ARB and Jardiance in the future. 4.  Hypertension: Blood pressure is well-controlled on his current regimen. 5.  Hyperlipidemia: Followed by PCP; goal LDL < 70 6.  Aortic atherosclerosis: Continue Plavix in lieu of aspirin, Crestor, and pursue strict blood pressure control.            Dispo:  Return in about 6 months (around 01/13/2023).      Medication Adjustments/Labs and Tests Ordered: Current medicines are reviewed at length with the patient today.  Concerns regarding medicines are outlined above.  The following changes have been made:  no change   Labs/tests ordered: Orders Placed This Encounter  Procedures   EKG 12-Lead    Medication Changes: No  orders of the defined types were placed in this encounter.    Current medicines are reviewed at length with the patient today.  The patient does not have concerns regarding medicines.   History of Present Illness:    FOCUSED PROBLEM LIST:   1.  Coronary artery disease status post PCI of mid LAD in 2008 due to unstable angina 2.  Hypertension 3.  Hyperlipidemia 4.  Type 2 diabetes on metformin 5.  Aortic atherosclerosis seen on CT lumbar spine 2021  The patient is a 77 y.o. male with the indicated medical history here for expedited doctor of the day visit.  I am seeing the patient as the doctor of the day.  The patient is normally cared for by Dr. Ellyn Dalton.  Patient was last seen in August 2022.  He was doing well.  He underwent abdominal aortic aneurysm screening which showed only mild dilatation at 2.5 cm.  He is to undergo a spinal nerve block to see if this helps his orthopedic issues with plans perhaps for surgery in the future. He is here for preoperative cardiovascular assessment.  He cannot walk as well as he did in the past.  However with this low amount of activity he denies any chest pain.  He denies any chest pain at rest.  He has had no bleeding or bruising recently.  He denies any emergency room visits or hospitalizations for cardiovascular issues.  He is tolerating his medications well.  His lipid panel is being monitored by his PCP.  Current Medications: Current Meds  Medication Sig   buPROPion (WELLBUTRIN XL) 300 MG 24 hr tablet Take 300 mg by mouth daily.   calcipotriene-betamethasone (TACLONEX SCALP) external suspension Apply 1 application topically daily as needed (SCALP PSORASIS).    cetirizine (ZYRTEC ALLERGY) 10 MG tablet Take 5 mg by mouth at bedtime.   clopidogrel (PLAVIX) 75 MG tablet TAKE 1 TABLET(75 MG) BY MOUTH DAILY   Cyanocobalamin (VITAMIN B12) 1000 MCG TBCR Take 1,000 mcg by mouth daily.    fluoruracil (CARAC) 0.5 % cream Apply 1 application  topically daily as needed (Burn of pre-cancer cells).    fluticasone (FLONASE) 50 MCG/ACT nasal spray Place 1 spray into both nostrils daily as needed for allergies.   Multiple Vitamins-Iron (MULTIVITAMIN/IRON PO) Take 1 tablet by mouth daily.   nitroGLYCERIN (NITROSTAT) 0.4 MG SL tablet TAKE AS DIRECTED FOR CHEST PAIN (Patient taking differently: Place 0.4 mg under the tongue every 5 (five) minutes as needed for chest pain.)   Omega-3 Fatty Acids (OMEGA-3 FISH OIL PO) Take 690 mg by mouth daily.    rosuvastatin (CRESTOR) 5 MG tablet Take 5 mg by mouth at bedtime.    simethicone (MYLICON) 80 MG chewable tablet Chew 80-240 mg by mouth every 6 (six) hours as needed for flatulence.    sodium fluoride-calcium carbonate (FLORICAL) 8.3-364 MG CAPS capsule Take 1 capsule by mouth 2 (two) times daily.   Vitamin D-Vitamin K (VITAMIN K2-VITAMIN D3 PO) Take 1 tablet by mouth daily.     Allergies:    Atorvastatin, Codeine, Gold, Nsaids, and Simvastatin   Social History:   Social History   Tobacco Use   Smoking status: Former    Types: Pipe    Quit date: 06/21/1982    Years since quitting: 40.0   Smokeless tobacco: Never  Substance Use Topics   Alcohol use: Yes    Comment: BEER 1-2 PER DAY   Drug use: No     Family Hx: Family History  Problem Relation Age of Onset   Heart disease Mother    Heart disease Father    Heart disease Brother      Review of Systems:   Please see the history of present illness.    All other systems reviewed and are negative.     EKGs/Labs/Other Test Reviewed:    EKG: EKG performed 2022 demonstrates normal sinus rhythm with a septal infarction pattern; EKG performed today that I personally reviewed demonstrates normal sinus rhythm with septal infarction.  Prior CV studies:  Coronary angiography 2011 demonstrating mild to moderate in-stent restenosis of previously placed left anterior descending artery stent  TTE 2014: - Left ventricle: The cavity size was  normal. Wall thickness    was normal. Systolic function was normal. The estimated    ejection fraction was in the range of 55% to 60%. Wall    motion was normal; there were no regional wall motion    abnormalities. Doppler parameters are consistent with    abnormal left ventricular relaxation (grade 1 diastolic    dysfunction).  - Atrial septum: No defect or patent foramen ovale was    identified.  - Tricuspid valve: Mild regurgitation.   Other studies Reviewed: Review of the additional studies/records demonstrates: Aortic atherosclerosis seen on CT lumbar spine 2021  Recent Labs: No results found for requested labs within last 365 days.   Recent Lipid Panel Lab Results  Component Value Date/Time   CHOL 150 04/17/2015 09:26 AM   TRIG 71 04/17/2015 09:26 AM  HDL 66 04/17/2015 09:26 AM   LDLCALC 70 04/17/2015 09:26 AM    Risk Assessment/Calculations:                Physical Exam:    VS:  BP 122/68   Pulse 76   Ht '5\' 10"'$  (1.778 m)   Wt 164 lb 3.2 oz (74.5 kg)   SpO2 98%   BMI 23.56 kg/m    Wt Readings from Last 3 Encounters:  07/15/22 164 lb 3.2 oz (74.5 kg)  01/29/21 156 lb 6.4 oz (70.9 kg)  04/24/20 150 lb (68 kg)    GENERAL:  No apparent distress, AOx3 HEENT:  No carotid bruits, +2 carotid impulses, no scleral icterus CAR: RRR no murmurs, gallops, rubs, or thrills RES:  Clear to auscultation bilaterally ABD:  Soft, nontender, nondistended, positive bowel sounds x 4 VASC:  +2 radial pulses, +2 carotid pulses, palpable pedal pulses NEURO:  CN 2-12 grossly intact; motor and sensory grossly intact PSYCH:  No active depression or anxiety EXT:  No edema, ecchymosis, or cyanosis  Signed, Early Osmond, MD  07/15/2022 5:03 PM    [facility] [place], [place], [place]  [address] Phone: [phone]; Fax: [phone]   Note:  This document was prepared using Dragon voice recognition software and may include unintentional dictation  errors.

## 2022-07-15 NOTE — Patient Instructions (Signed)
Medication Instructions:  No changes *If you need a refill on your cardiac medications before your next appointment, please call your pharmacy*   Lab Work: none If you have labs (blood work) drawn today and your tests are completely normal, you will receive your results only by: [place] (if you have MyChart) OR A paper copy in the mail If you have any lab test that is abnormal or we need to change your treatment, we will call you to review the results.   Testing/Procedures: none   Follow-Up: At [facility], you and your health needs are our priority.  As part of our continuing mission to provide you with exceptional heart care, we have created designated Provider Care Teams.  These Care Teams include your primary Cardiologist (physician) and Advanced Practice Providers (APPs -  Physician Assistants and Nurse Practitioners) who all work together to provide you with the care you need, when you need it.   Your next appointment:   6 month(s)  Provider:   Glenetta Hew, MD  or Advanced Practice Provider

## 2022-07-15 NOTE — Telephone Encounter (Signed)
ADDENDUM: NO ANESTHESIA TO BE USED; ONLY A NUMBING SPRAY PER SURGERY SCHEDULER.

## 2022-07-15 NOTE — Telephone Encounter (Signed)
Pt is agreeable to in office appt at our [facility]. Location as there are no openings at [facility] with the exception of 1 DOD slot; per nurse to hold for triage. DOD Dr. Ali Lowe has agreed to see the pt at [facility] today at 4:20. Pt and his wife are grateful for the help.     ADDENDUM ON CLEARANCE:  PROCEDURE IS: LUMBAR SELECTIVE NERVE ROOT BLOCK  TO BE DONE WITH DR. Orland Penman

## 2022-07-20 DIAGNOSIS — M48062 Spinal stenosis, lumbar region with neurogenic claudication: Secondary | ICD-10-CM | POA: Diagnosis not present

## 2022-07-20 DIAGNOSIS — M5451 Vertebrogenic low back pain: Secondary | ICD-10-CM | POA: Diagnosis not present

## 2022-07-22 DIAGNOSIS — M5416 Radiculopathy, lumbar region: Secondary | ICD-10-CM | POA: Diagnosis not present

## 2022-07-23 DIAGNOSIS — M5451 Vertebrogenic low back pain: Secondary | ICD-10-CM | POA: Diagnosis not present

## 2022-07-23 DIAGNOSIS — M48062 Spinal stenosis, lumbar region with neurogenic claudication: Secondary | ICD-10-CM | POA: Diagnosis not present

## 2022-07-26 DIAGNOSIS — M48062 Spinal stenosis, lumbar region with neurogenic claudication: Secondary | ICD-10-CM | POA: Diagnosis not present

## 2022-07-26 DIAGNOSIS — M5451 Vertebrogenic low back pain: Secondary | ICD-10-CM | POA: Diagnosis not present

## 2022-08-04 DIAGNOSIS — M48062 Spinal stenosis, lumbar region with neurogenic claudication: Secondary | ICD-10-CM | POA: Diagnosis not present

## 2022-08-04 DIAGNOSIS — M5451 Vertebrogenic low back pain: Secondary | ICD-10-CM | POA: Diagnosis not present

## 2022-08-11 DIAGNOSIS — M48062 Spinal stenosis, lumbar region with neurogenic claudication: Secondary | ICD-10-CM | POA: Diagnosis not present

## 2022-08-11 DIAGNOSIS — M5451 Vertebrogenic low back pain: Secondary | ICD-10-CM | POA: Diagnosis not present

## 2022-08-25 DIAGNOSIS — M48062 Spinal stenosis, lumbar region with neurogenic claudication: Secondary | ICD-10-CM | POA: Diagnosis not present

## 2022-08-25 DIAGNOSIS — M5451 Vertebrogenic low back pain: Secondary | ICD-10-CM | POA: Diagnosis not present

## 2022-09-01 DIAGNOSIS — M5451 Vertebrogenic low back pain: Secondary | ICD-10-CM | POA: Diagnosis not present

## 2022-09-01 DIAGNOSIS — M48062 Spinal stenosis, lumbar region with neurogenic claudication: Secondary | ICD-10-CM | POA: Diagnosis not present

## 2022-09-02 DIAGNOSIS — M545 Low back pain, unspecified: Secondary | ICD-10-CM | POA: Diagnosis not present

## 2022-09-03 ENCOUNTER — Other Ambulatory Visit: Payer: Self-pay | Admitting: Specialist

## 2022-09-03 DIAGNOSIS — M5459 Other low back pain: Secondary | ICD-10-CM

## 2022-09-03 DIAGNOSIS — M5451 Vertebrogenic low back pain: Secondary | ICD-10-CM | POA: Diagnosis not present

## 2022-09-03 DIAGNOSIS — M48062 Spinal stenosis, lumbar region with neurogenic claudication: Secondary | ICD-10-CM | POA: Diagnosis not present

## 2022-09-08 DIAGNOSIS — M48062 Spinal stenosis, lumbar region with neurogenic claudication: Secondary | ICD-10-CM | POA: Diagnosis not present

## 2022-09-08 DIAGNOSIS — M5451 Vertebrogenic low back pain: Secondary | ICD-10-CM | POA: Diagnosis not present

## 2022-09-13 DIAGNOSIS — M48062 Spinal stenosis, lumbar region with neurogenic claudication: Secondary | ICD-10-CM | POA: Diagnosis not present

## 2022-09-13 DIAGNOSIS — M5451 Vertebrogenic low back pain: Secondary | ICD-10-CM | POA: Diagnosis not present

## 2022-09-28 DIAGNOSIS — E611 Iron deficiency: Secondary | ICD-10-CM | POA: Diagnosis not present

## 2022-09-29 ENCOUNTER — Ambulatory Visit
Admission: RE | Admit: 2022-09-29 | Discharge: 2022-09-29 | Disposition: A | Discharge status: Home / Self Care | Payer: Medicare Other | Source: Ambulatory Visit | Attending: Specialist | Admitting: Specialist

## 2022-09-29 DIAGNOSIS — M5459 Other low back pain: Secondary | ICD-10-CM

## 2022-09-29 DIAGNOSIS — M48061 Spinal stenosis, lumbar region without neurogenic claudication: Secondary | ICD-10-CM | POA: Diagnosis not present

## 2022-09-29 DIAGNOSIS — M5126 Other intervertebral disc displacement, lumbar region: Secondary | ICD-10-CM | POA: Diagnosis not present

## 2022-09-29 DIAGNOSIS — M4186 Other forms of scoliosis, lumbar region: Secondary | ICD-10-CM | POA: Diagnosis not present

## 2022-09-29 MED ORDER — ONDANSETRON HCL 4 MG/2ML IJ SOLN: 4.0000 mg | Freq: Once | INTRAMUSCULAR | Status: DC | PRN

## 2022-09-29 MED ORDER — DIAZEPAM 5 MG PO TABS: 5.0000 mg | ORAL_TABLET | Freq: Once | ORAL | Status: DC

## 2022-09-29 MED ORDER — MEPERIDINE HCL 50 MG/ML IJ SOLN: 50.0000 mg | Freq: Once | INTRAMUSCULAR | Status: DC | PRN

## 2022-09-29 MED ORDER — IOPAMIDOL (ISOVUE-M 200) INJECTION 41%
20.0000 mL | Freq: Once | INTRAMUSCULAR | Status: AC
  Administered 2022-09-29: 20 mL via INTRATHECAL

## 2022-09-29 NOTE — Discharge Instructions (Signed)

## 2022-10-06 DIAGNOSIS — Z01818 Encounter for other preprocedural examination: Secondary | ICD-10-CM | POA: Diagnosis not present

## 2022-10-06 DIAGNOSIS — G4733 Obstructive sleep apnea (adult) (pediatric): Secondary | ICD-10-CM | POA: Diagnosis not present

## 2022-10-06 DIAGNOSIS — H2511 Age-related nuclear cataract, right eye: Secondary | ICD-10-CM | POA: Diagnosis not present

## 2022-10-07 DIAGNOSIS — M545 Low back pain, unspecified: Secondary | ICD-10-CM | POA: Diagnosis not present

## 2022-10-11 DIAGNOSIS — R3915 Urgency of urination: Secondary | ICD-10-CM | POA: Diagnosis not present

## 2022-10-11 DIAGNOSIS — N401 Enlarged prostate with lower urinary tract symptoms: Secondary | ICD-10-CM | POA: Diagnosis not present

## 2022-10-11 DIAGNOSIS — N529 Male erectile dysfunction, unspecified: Secondary | ICD-10-CM | POA: Diagnosis not present

## 2022-10-11 DIAGNOSIS — R350 Frequency of micturition: Secondary | ICD-10-CM | POA: Diagnosis not present

## 2022-11-03 DIAGNOSIS — R350 Frequency of micturition: Secondary | ICD-10-CM | POA: Diagnosis not present

## 2022-11-03 DIAGNOSIS — R3915 Urgency of urination: Secondary | ICD-10-CM | POA: Diagnosis not present

## 2022-11-17 DIAGNOSIS — M5459 Other low back pain: Secondary | ICD-10-CM | POA: Diagnosis not present

## 2022-11-17 DIAGNOSIS — M1712 Unilateral primary osteoarthritis, left knee: Secondary | ICD-10-CM | POA: Diagnosis not present

## 2022-11-17 DIAGNOSIS — M25562 Pain in left knee: Secondary | ICD-10-CM | POA: Diagnosis not present

## 2022-11-23 DIAGNOSIS — D509 Iron deficiency anemia, unspecified: Secondary | ICD-10-CM | POA: Diagnosis not present

## 2022-11-23 DIAGNOSIS — K2100 Gastro-esophageal reflux disease with esophagitis, without bleeding: Secondary | ICD-10-CM | POA: Diagnosis not present

## 2022-11-29 DIAGNOSIS — R269 Unspecified abnormalities of gait and mobility: Secondary | ICD-10-CM | POA: Diagnosis not present

## 2022-11-29 DIAGNOSIS — G629 Polyneuropathy, unspecified: Secondary | ICD-10-CM | POA: Diagnosis not present

## 2022-11-29 DIAGNOSIS — K912 Postsurgical malabsorption, not elsewhere classified: Secondary | ICD-10-CM | POA: Diagnosis not present

## 2022-11-29 DIAGNOSIS — G4733 Obstructive sleep apnea (adult) (pediatric): Secondary | ICD-10-CM | POA: Diagnosis not present

## 2022-12-01 DIAGNOSIS — M1712 Unilateral primary osteoarthritis, left knee: Secondary | ICD-10-CM | POA: Diagnosis not present

## 2022-12-01 DIAGNOSIS — M25562 Pain in left knee: Secondary | ICD-10-CM | POA: Diagnosis not present

## 2022-12-01 DIAGNOSIS — M5459 Other low back pain: Secondary | ICD-10-CM | POA: Diagnosis not present

## 2022-12-09 DIAGNOSIS — M5459 Other low back pain: Secondary | ICD-10-CM | POA: Diagnosis not present

## 2022-12-09 DIAGNOSIS — M25562 Pain in left knee: Secondary | ICD-10-CM | POA: Diagnosis not present

## 2022-12-09 DIAGNOSIS — M1712 Unilateral primary osteoarthritis, left knee: Secondary | ICD-10-CM | POA: Diagnosis not present

## 2022-12-13 DIAGNOSIS — M25562 Pain in left knee: Secondary | ICD-10-CM | POA: Diagnosis not present

## 2022-12-13 DIAGNOSIS — M1712 Unilateral primary osteoarthritis, left knee: Secondary | ICD-10-CM | POA: Diagnosis not present

## 2022-12-13 DIAGNOSIS — M5459 Other low back pain: Secondary | ICD-10-CM | POA: Diagnosis not present

## 2022-12-20 DIAGNOSIS — M25562 Pain in left knee: Secondary | ICD-10-CM | POA: Diagnosis not present

## 2022-12-20 DIAGNOSIS — M1712 Unilateral primary osteoarthritis, left knee: Secondary | ICD-10-CM | POA: Diagnosis not present

## 2022-12-20 DIAGNOSIS — M5459 Other low back pain: Secondary | ICD-10-CM | POA: Diagnosis not present

## 2023-01-06 DIAGNOSIS — M5459 Other low back pain: Secondary | ICD-10-CM | POA: Diagnosis not present

## 2023-01-06 DIAGNOSIS — M1712 Unilateral primary osteoarthritis, left knee: Secondary | ICD-10-CM | POA: Diagnosis not present

## 2023-01-06 DIAGNOSIS — M25562 Pain in left knee: Secondary | ICD-10-CM | POA: Diagnosis not present

## 2023-01-11 DIAGNOSIS — N5201 Erectile dysfunction due to arterial insufficiency: Secondary | ICD-10-CM | POA: Diagnosis not present

## 2023-01-11 DIAGNOSIS — R350 Frequency of micturition: Secondary | ICD-10-CM | POA: Diagnosis not present

## 2023-01-11 DIAGNOSIS — N3941 Urge incontinence: Secondary | ICD-10-CM | POA: Diagnosis not present

## 2023-01-12 ENCOUNTER — Ambulatory Visit: Payer: Medicare Other | Admitting: Physician Assistant

## 2023-01-12 VITALS — BP 116/72 | HR 69 | Ht 69.5 in | Wt 168.2 lb

## 2023-01-12 DIAGNOSIS — E119 Type 2 diabetes mellitus without complications: Secondary | ICD-10-CM | POA: Insufficient documentation

## 2023-01-12 DIAGNOSIS — E785 Hyperlipidemia, unspecified: Secondary | ICD-10-CM | POA: Diagnosis not present

## 2023-01-12 DIAGNOSIS — R29898 Other symptoms and signs involving the musculoskeletal system: Secondary | ICD-10-CM | POA: Insufficient documentation

## 2023-01-12 DIAGNOSIS — I2510 Atherosclerotic heart disease of native coronary artery without angina pectoris: Secondary | ICD-10-CM | POA: Insufficient documentation

## 2023-01-12 NOTE — Progress Notes (Signed)
  Cardiology Office Note:  .   Date:  01/12/2023  ID:  Jacob Dalton, DOB [birth year], MRN [id number] PCP: Marden Noble, MD (Inactive)  [facility] Providers Cardiologist:  Bryan Lemma, MD     History of Present Illness: .   Jacob Dalton is a 77 y.o. male was past medical history of CAD s/p DES to LAD in 2008, hyperlipidemia, and DM2.  ABI in 2021 showed noncompressible lower extremity arteries.  He had AAA screening which showed mild dilatation of the abdominal aorta measuring to 2.5 cm.  Echocardiogram in 2014 showed EF 55 to 60%, grade 1 DD, no regional wall motion abnormality, mild TR.  Cardiac catheterization in 2011 showed mild restenosis of LAD stent, otherwise normal coronary arteries.  He was last seen by Blima Rich in August 2022 at which time he complained of leg pain with walking.  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  More recently, he was seen by Dr. Lynnette Caffey for preoperative clearance prior to epidural nerve block.  Note, majority of his care was done under [facility].  Since about a week ago, he has been started on Gemtesa 75 mg for urinary frequency.  It is helping his frequent micturition.  He denies any exertional chest pain or shortness of breath recently.  He continues to have lower extremity weakness consistent with neurogenic claudication symptom.  This has been going on for the past 3 years.  He is doing water aerobic in the past few weeks.  He has not taken any metformin for the past year.  He is diabetes is diet controlled.  Overall, patient has been doing well and can follow-up with Dr. Herbie Baltimore in 10 months.  ROS:   He denies chest pain, palpitations, dyspnea, pnd, orthopnea, n, v, dizziness, syncope, edema, weight gain, or early satiety. All other systems reviewed and are otherwise negative except as noted above.    Studies Reviewed: .            Risk Assessment/Calculations:             Physical Exam:   VS:  BP  116/72   Pulse 69   Ht 5' 9.5" (1.765 m)   Wt 168 lb 3.2 oz (76.3 kg)   SpO2 96%   BMI 24.48 kg/m    Wt Readings from Last 3 Encounters:  01/12/23 168 lb 3.2 oz (76.3 kg)  07/15/22 164 lb 3.2 oz (74.5 kg)  01/29/21 156 lb 6.4 oz (70.9 kg)    GEN: Well nourished, well developed in no acute distress NECK: No JVD; No carotid bruits CARDIAC: RRR, no murmurs, rubs, gallops RESPIRATORY:  Clear to auscultation without rales, wheezing or rhonchi  ABDOMEN: Soft, non-tender, non-distended EXTREMITIES:  No edema; No deformity   ASSESSMENT AND PLAN: .    CAD: Denies any recent chest pain.  Continue Plavix and Crestor.  Hyperlipidemia: On Crestor  DM2: Managed by primary care provider.  Chronic leg weakness: Felt to be related to neurogenic claudication symptom.       Dispo: Follow-up with Dr. Herbie Baltimore in 90-month.  Signed, Azalee Course, PA

## 2023-01-12 NOTE — Patient Instructions (Signed)
Medication Instructions:  Your physician recommends that you continue on your current medications as directed. Please refer to the Current Medication list given to you today.  *If you need a refill on your cardiac medications before your next appointment, please call your pharmacy*   Lab Work: None ordered  If you have labs (blood work) drawn today and your tests are completely normal, you will receive your results only by: MyChart Message (if you have MyChart) OR A paper copy in the mail If you have any lab test that is abnormal or we need to change your treatment, we will call you to review the results.   Testing/Procedures: None ordered   Follow-Up: At [facility], you and your health needs are our priority.  As part of our continuing mission to provide you with exceptional heart care, we have created designated Provider Care Teams.  These Care Teams include your primary Cardiologist (physician) and Advanced Practice Providers (APPs -  Physician Assistants and Nurse Practitioners) who all work together to provide you with the care you need, when you need it.  We recommend signing up for the patient portal called "MyChart".  Sign up information is provided on this After Visit Summary.  MyChart is used to connect with patients for Virtual Visits (Telemedicine).  Patients are able to view lab/test results, encounter notes, upcoming appointments, etc.  Non-urgent messages can be sent to your provider as well.   To learn more about what you can do with MyChart, go to [email or web address].    Your next appointment:   10 month(s)  Provider:   Bryan Lemma, MD     Other Instructions

## 2023-01-13 DIAGNOSIS — M5459 Other low back pain: Secondary | ICD-10-CM | POA: Diagnosis not present

## 2023-01-13 DIAGNOSIS — M25562 Pain in left knee: Secondary | ICD-10-CM | POA: Diagnosis not present

## 2023-01-13 DIAGNOSIS — M1712 Unilateral primary osteoarthritis, left knee: Secondary | ICD-10-CM | POA: Diagnosis not present

## 2023-01-19 DIAGNOSIS — M79641 Pain in right hand: Secondary | ICD-10-CM | POA: Diagnosis not present

## 2023-01-20 DIAGNOSIS — M25562 Pain in left knee: Secondary | ICD-10-CM | POA: Diagnosis not present

## 2023-01-20 DIAGNOSIS — M5459 Other low back pain: Secondary | ICD-10-CM | POA: Diagnosis not present

## 2023-01-20 DIAGNOSIS — M1712 Unilateral primary osteoarthritis, left knee: Secondary | ICD-10-CM | POA: Diagnosis not present

## 2023-01-26 DIAGNOSIS — M5459 Other low back pain: Secondary | ICD-10-CM | POA: Diagnosis not present

## 2023-01-26 DIAGNOSIS — M25562 Pain in left knee: Secondary | ICD-10-CM | POA: Diagnosis not present

## 2023-01-26 DIAGNOSIS — M1712 Unilateral primary osteoarthritis, left knee: Secondary | ICD-10-CM | POA: Diagnosis not present

## 2023-02-02 DIAGNOSIS — M1712 Unilateral primary osteoarthritis, left knee: Secondary | ICD-10-CM | POA: Diagnosis not present

## 2023-02-02 DIAGNOSIS — M5459 Other low back pain: Secondary | ICD-10-CM | POA: Diagnosis not present

## 2023-02-02 DIAGNOSIS — M25562 Pain in left knee: Secondary | ICD-10-CM | POA: Diagnosis not present

## 2023-02-09 DIAGNOSIS — M1712 Unilateral primary osteoarthritis, left knee: Secondary | ICD-10-CM | POA: Diagnosis not present

## 2023-02-09 DIAGNOSIS — M5459 Other low back pain: Secondary | ICD-10-CM | POA: Diagnosis not present

## 2023-02-09 DIAGNOSIS — M25562 Pain in left knee: Secondary | ICD-10-CM | POA: Diagnosis not present

## 2023-02-15 DIAGNOSIS — M1712 Unilateral primary osteoarthritis, left knee: Secondary | ICD-10-CM | POA: Diagnosis not present

## 2023-02-15 DIAGNOSIS — M5459 Other low back pain: Secondary | ICD-10-CM | POA: Diagnosis not present

## 2023-02-15 DIAGNOSIS — M25562 Pain in left knee: Secondary | ICD-10-CM | POA: Diagnosis not present

## 2023-03-08 DIAGNOSIS — M5459 Other low back pain: Secondary | ICD-10-CM | POA: Diagnosis not present

## 2023-03-08 DIAGNOSIS — M25562 Pain in left knee: Secondary | ICD-10-CM | POA: Diagnosis not present

## 2023-03-08 DIAGNOSIS — M1712 Unilateral primary osteoarthritis, left knee: Secondary | ICD-10-CM | POA: Diagnosis not present

## 2023-03-15 DIAGNOSIS — M5459 Other low back pain: Secondary | ICD-10-CM | POA: Diagnosis not present

## 2023-03-15 DIAGNOSIS — M25562 Pain in left knee: Secondary | ICD-10-CM | POA: Diagnosis not present

## 2023-03-15 DIAGNOSIS — M1712 Unilateral primary osteoarthritis, left knee: Secondary | ICD-10-CM | POA: Diagnosis not present

## 2023-03-23 DIAGNOSIS — M25562 Pain in left knee: Secondary | ICD-10-CM | POA: Diagnosis not present

## 2023-03-23 DIAGNOSIS — M5459 Other low back pain: Secondary | ICD-10-CM | POA: Diagnosis not present

## 2023-03-23 DIAGNOSIS — M1712 Unilateral primary osteoarthritis, left knee: Secondary | ICD-10-CM | POA: Diagnosis not present

## 2023-03-26 ENCOUNTER — Other Ambulatory Visit: Payer: Self-pay | Admitting: Cardiology

## 2023-03-28 DIAGNOSIS — M25562 Pain in left knee: Secondary | ICD-10-CM | POA: Diagnosis not present

## 2023-03-28 DIAGNOSIS — M5459 Other low back pain: Secondary | ICD-10-CM | POA: Diagnosis not present

## 2023-03-28 DIAGNOSIS — M1712 Unilateral primary osteoarthritis, left knee: Secondary | ICD-10-CM | POA: Diagnosis not present

## 2023-04-06 DIAGNOSIS — M48061 Spinal stenosis, lumbar region without neurogenic claudication: Secondary | ICD-10-CM | POA: Diagnosis not present

## 2023-04-06 DIAGNOSIS — D509 Iron deficiency anemia, unspecified: Secondary | ICD-10-CM | POA: Diagnosis not present

## 2023-04-06 DIAGNOSIS — E559 Vitamin D deficiency, unspecified: Secondary | ICD-10-CM | POA: Diagnosis not present

## 2023-04-06 DIAGNOSIS — I2510 Atherosclerotic heart disease of native coronary artery without angina pectoris: Secondary | ICD-10-CM | POA: Diagnosis not present

## 2023-04-06 DIAGNOSIS — F324 Major depressive disorder, single episode, in partial remission: Secondary | ICD-10-CM | POA: Diagnosis not present

## 2023-04-06 DIAGNOSIS — Z1331 Encounter for screening for depression: Secondary | ICD-10-CM | POA: Diagnosis not present

## 2023-04-06 DIAGNOSIS — E119 Type 2 diabetes mellitus without complications: Secondary | ICD-10-CM | POA: Diagnosis not present

## 2023-04-06 DIAGNOSIS — E782 Mixed hyperlipidemia: Secondary | ICD-10-CM | POA: Diagnosis not present

## 2023-04-06 DIAGNOSIS — E538 Deficiency of other specified B group vitamins: Secondary | ICD-10-CM | POA: Diagnosis not present

## 2023-04-06 DIAGNOSIS — Z0000 Encounter for general adult medical examination without abnormal findings: Secondary | ICD-10-CM | POA: Diagnosis not present

## 2023-04-06 DIAGNOSIS — Z79899 Other long term (current) drug therapy: Secondary | ICD-10-CM | POA: Diagnosis not present

## 2023-04-06 DIAGNOSIS — E611 Iron deficiency: Secondary | ICD-10-CM | POA: Diagnosis not present

## 2023-04-06 DIAGNOSIS — Z23 Encounter for immunization: Secondary | ICD-10-CM | POA: Diagnosis not present

## 2023-04-06 DIAGNOSIS — G629 Polyneuropathy, unspecified: Secondary | ICD-10-CM | POA: Diagnosis not present

## 2023-04-06 DIAGNOSIS — R269 Unspecified abnormalities of gait and mobility: Secondary | ICD-10-CM | POA: Diagnosis not present

## 2023-04-07 DIAGNOSIS — E119 Type 2 diabetes mellitus without complications: Secondary | ICD-10-CM | POA: Diagnosis not present

## 2023-04-13 DIAGNOSIS — M5459 Other low back pain: Secondary | ICD-10-CM | POA: Diagnosis not present

## 2023-04-13 DIAGNOSIS — M25562 Pain in left knee: Secondary | ICD-10-CM | POA: Diagnosis not present

## 2023-04-13 DIAGNOSIS — M1712 Unilateral primary osteoarthritis, left knee: Secondary | ICD-10-CM | POA: Diagnosis not present

## 2023-04-27 DIAGNOSIS — M1712 Unilateral primary osteoarthritis, left knee: Secondary | ICD-10-CM | POA: Diagnosis not present

## 2023-04-27 DIAGNOSIS — M5459 Other low back pain: Secondary | ICD-10-CM | POA: Diagnosis not present

## 2023-04-27 DIAGNOSIS — M25562 Pain in left knee: Secondary | ICD-10-CM | POA: Diagnosis not present

## 2023-05-04 DIAGNOSIS — M1712 Unilateral primary osteoarthritis, left knee: Secondary | ICD-10-CM | POA: Diagnosis not present

## 2023-05-04 DIAGNOSIS — M5459 Other low back pain: Secondary | ICD-10-CM | POA: Diagnosis not present

## 2023-05-04 DIAGNOSIS — M25562 Pain in left knee: Secondary | ICD-10-CM | POA: Diagnosis not present

## 2023-05-05 DIAGNOSIS — R748 Abnormal levels of other serum enzymes: Secondary | ICD-10-CM | POA: Diagnosis not present

## 2023-05-11 DIAGNOSIS — M5459 Other low back pain: Secondary | ICD-10-CM | POA: Diagnosis not present

## 2023-05-11 DIAGNOSIS — M25562 Pain in left knee: Secondary | ICD-10-CM | POA: Diagnosis not present

## 2023-05-11 DIAGNOSIS — M1712 Unilateral primary osteoarthritis, left knee: Secondary | ICD-10-CM | POA: Diagnosis not present

## 2023-05-18 DIAGNOSIS — L814 Other melanin hyperpigmentation: Secondary | ICD-10-CM | POA: Diagnosis not present

## 2023-05-18 DIAGNOSIS — L110 Acquired keratosis follicularis: Secondary | ICD-10-CM | POA: Diagnosis not present

## 2023-05-18 DIAGNOSIS — D485 Neoplasm of uncertain behavior of skin: Secondary | ICD-10-CM | POA: Diagnosis not present

## 2023-05-18 DIAGNOSIS — L820 Inflamed seborrheic keratosis: Secondary | ICD-10-CM | POA: Diagnosis not present

## 2023-05-18 DIAGNOSIS — L570 Actinic keratosis: Secondary | ICD-10-CM | POA: Diagnosis not present

## 2023-05-18 DIAGNOSIS — L218 Other seborrheic dermatitis: Secondary | ICD-10-CM | POA: Diagnosis not present

## 2023-05-18 DIAGNOSIS — Z85828 Personal history of other malignant neoplasm of skin: Secondary | ICD-10-CM | POA: Diagnosis not present

## 2023-05-18 DIAGNOSIS — D1801 Hemangioma of skin and subcutaneous tissue: Secondary | ICD-10-CM | POA: Diagnosis not present

## 2023-05-18 DIAGNOSIS — L821 Other seborrheic keratosis: Secondary | ICD-10-CM | POA: Diagnosis not present

## 2023-05-18 DIAGNOSIS — L578 Other skin changes due to chronic exposure to nonionizing radiation: Secondary | ICD-10-CM | POA: Diagnosis not present

## 2023-05-25 DIAGNOSIS — M25562 Pain in left knee: Secondary | ICD-10-CM | POA: Diagnosis not present

## 2023-05-25 DIAGNOSIS — M5459 Other low back pain: Secondary | ICD-10-CM | POA: Diagnosis not present

## 2023-05-25 DIAGNOSIS — M1712 Unilateral primary osteoarthritis, left knee: Secondary | ICD-10-CM | POA: Diagnosis not present

## 2023-06-02 DIAGNOSIS — K317 Polyp of stomach and duodenum: Secondary | ICD-10-CM | POA: Diagnosis not present

## 2023-06-02 DIAGNOSIS — I2510 Atherosclerotic heart disease of native coronary artery without angina pectoris: Secondary | ICD-10-CM | POA: Diagnosis not present

## 2023-06-02 DIAGNOSIS — Z860101 Personal history of adenomatous and serrated colon polyps: Secondary | ICD-10-CM | POA: Diagnosis not present

## 2023-07-04 ENCOUNTER — Telehealth: Payer: Self-pay | Admitting: *Deleted

## 2023-07-04 NOTE — Telephone Encounter (Signed)
   Name: Jacob Dalton  DOB: Oct 25, 1945  MRN: [id number]  Primary Cardiologist: Alm Clay, MD   Preoperative team, please contact this patient and set up a phone call appointment for further preoperative risk assessment. Please obtain consent and complete medication review. Thank you for your help.  I confirm that guidance regarding antiplatelet and oral anticoagulation therapy has been completed and, if necessary, noted below.  Per office protocol, if patient is without any new symptoms or concerns at the time of their visit, he may Plavix  for 5 days prior to procedure. Please resume Plavix  as soon as possible postprocedure, at the discretion of the surgeon.   I also confirmed the patient resides in the state of [place] . As per [facility] Medical [employer] telemedicine laws, the patient must reside in the state in which the provider is licensed.   Wyn Raddle, Jackee Shove, NP 07/04/2023, 11:45 AM [facility]

## 2023-07-04 NOTE — Telephone Encounter (Signed)
 Pt has been scheduled for a tele, 07/06/22 3:20.  Consent on file / medications reconciled.

## 2023-07-04 NOTE — Telephone Encounter (Signed)
   Pre-operative Risk Assessment    Patient Name: Jacob Dalton  DOB: May 11, 1946 MRN: [id number]   Date of last office visit: 01/12/23 Date of next office visit: None   Request for Surgical Clearance    Procedure:   Colonoscopy/Endoscopy  Date of Surgery:  Clearance 07/14/23                                 Surgeon:  Dr. Elsie Cree Surgeon's Group or Practice Name:  [facility] Phone number:  [phone] Fax number:  [phone]   Type of Clearance Requested:   - Medical  - Pharmacy:  Hold Clopidogrel  (Plavix ) Not Indicated   Type of Anesthesia:   Propofol    Additional requests/questions:    Signed, Edsel Grayce Sanders   07/04/2023, 11:38 AM

## 2023-07-04 NOTE — Telephone Encounter (Signed)
 Pt has been scheduled for a tele, 07/06/22 3:20.  Consent on file / medications reconciled.    Patient Consent for Virtual Visit        Jacob Dalton has provided verbal consent on 07/04/2023 for a virtual visit (video or telephone).   CONSENT FOR VIRTUAL VISIT FOR:  [name]  By participating in this virtual visit I agree to the following:  I hereby voluntarily request, consent and authorize [facility] and its employed or contracted physicians, physician assistants, nurse practitioners or other licensed health care professionals (the Practitioner), to provide me with telemedicine health care services (the "Services) as deemed necessary by the treating Practitioner. I acknowledge and consent to receive the Services by the Practitioner via telemedicine. I understand that the telemedicine visit will involve communicating with the Practitioner through live audiovisual communication technology and the disclosure of certain medical information by electronic transmission. I acknowledge that I have been given the opportunity to request an in-person assessment or other available alternative prior to the telemedicine visit and am voluntarily participating in the telemedicine visit.  I understand that I have the right to withhold or withdraw my consent to the use of telemedicine in the course of my care at any time, without affecting my right to future care or treatment, and that the Practitioner or I may terminate the telemedicine visit at any time. I understand that I have the right to inspect all information obtained and/or recorded in the course of the telemedicine visit and may receive copies of available information for a reasonable fee.  I understand that some of the potential risks of receiving the Services via telemedicine include:  Delay or interruption in medical evaluation due to technological equipment failure or disruption; Information transmitted may not be sufficient (e.g.  poor resolution of images) to allow for appropriate medical decision making by the Practitioner; and/or  In rare instances, security protocols could fail, causing a breach of personal health information.  Furthermore, I acknowledge that it is my responsibility to provide information about my medical history, conditions and care that is complete and accurate to the best of my ability. I acknowledge that Practitioner's advice, recommendations, and/or decision may be based on factors not within their control, such as incomplete or inaccurate data provided by me or distortions of diagnostic images or specimens that may result from electronic transmissions. I understand that the practice of medicine is not an exact science and that Practitioner makes no warranties or guarantees regarding treatment outcomes. I acknowledge that a copy of this consent can be made available to me via my patient portal [facility] MyChart), or I can request a printed copy by calling the office of  HeartCare.    I understand that my insurance will be billed for this visit.   I have read or had this consent read to me. I understand the contents of this consent, which adequately explains the benefits and risks of the Services being provided via telemedicine.  I have been provided ample opportunity to ask questions regarding this consent and the Services and have had my questions answered to my satisfaction. I give my informed consent for the services to be provided through the use of telemedicine in my medical care

## 2023-07-07 ENCOUNTER — Ambulatory Visit: Payer: Medicare Other

## 2023-07-07 ENCOUNTER — Telehealth: Payer: Self-pay | Admitting: Cardiology

## 2023-07-07 DIAGNOSIS — Z01810 Encounter for preprocedural cardiovascular examination: Secondary | ICD-10-CM | POA: Diagnosis not present
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
## 2023-07-07 NOTE — Telephone Encounter (Signed)
Pre op APP s/w the pt already.

## 2023-07-07 NOTE — Telephone Encounter (Signed)
Patient's wife called stating the patient was unavailable today for his tele visit and requested to reschedule his appointment. Please advise.

## 2023-07-11 ENCOUNTER — Telehealth: Payer: Medicare Other

## 2023-07-14 DIAGNOSIS — K5730 Diverticulosis of large intestine without perforation or abscess without bleeding: Secondary | ICD-10-CM | POA: Diagnosis not present

## 2023-07-14 DIAGNOSIS — Z860101 Personal history of adenomatous and serrated colon polyps: Secondary | ICD-10-CM | POA: Diagnosis not present

## 2023-07-14 DIAGNOSIS — Z980 Intestinal bypass and anastomosis status: Secondary | ICD-10-CM | POA: Diagnosis not present

## 2023-07-14 DIAGNOSIS — K317 Polyp of stomach and duodenum: Secondary | ICD-10-CM | POA: Diagnosis not present

## 2023-07-14 DIAGNOSIS — D123 Benign neoplasm of transverse colon: Secondary | ICD-10-CM | POA: Diagnosis not present

## 2023-07-14 DIAGNOSIS — K219 Gastro-esophageal reflux disease without esophagitis: Secondary | ICD-10-CM | POA: Diagnosis not present

## 2023-07-14 DIAGNOSIS — R1013 Epigastric pain: Secondary | ICD-10-CM | POA: Diagnosis not present

## 2023-07-14 DIAGNOSIS — Z09 Encounter for follow-up examination after completed treatment for conditions other than malignant neoplasm: Secondary | ICD-10-CM | POA: Diagnosis not present

## 2023-07-14 DIAGNOSIS — K648 Other hemorrhoids: Secondary | ICD-10-CM | POA: Diagnosis not present

## 2023-07-14 DIAGNOSIS — K2930 Chronic superficial gastritis without bleeding: Secondary | ICD-10-CM | POA: Diagnosis not present

## 2023-07-14 LAB — HM COLONOSCOPY

## 2023-07-15 DIAGNOSIS — K2930 Chronic superficial gastritis without bleeding: Secondary | ICD-10-CM | POA: Diagnosis not present

## 2023-07-19 DIAGNOSIS — Z9049 Acquired absence of other specified parts of digestive tract: Secondary | ICD-10-CM | POA: Diagnosis not present

## 2023-07-19 DIAGNOSIS — M1712 Unilateral primary osteoarthritis, left knee: Secondary | ICD-10-CM | POA: Diagnosis not present

## 2023-07-19 DIAGNOSIS — Z903 Acquired absence of stomach [part of]: Secondary | ICD-10-CM | POA: Diagnosis not present

## 2023-07-19 DIAGNOSIS — G522 Disorders of vagus nerve: Secondary | ICD-10-CM | POA: Diagnosis not present

## 2023-07-19 DIAGNOSIS — M5459 Other low back pain: Secondary | ICD-10-CM | POA: Diagnosis not present

## 2023-07-19 DIAGNOSIS — M25562 Pain in left knee: Secondary | ICD-10-CM | POA: Diagnosis not present

## 2023-07-21 DIAGNOSIS — M25562 Pain in left knee: Secondary | ICD-10-CM | POA: Diagnosis not present

## 2023-07-21 DIAGNOSIS — M1712 Unilateral primary osteoarthritis, left knee: Secondary | ICD-10-CM | POA: Diagnosis not present

## 2023-07-21 DIAGNOSIS — M5459 Other low back pain: Secondary | ICD-10-CM | POA: Diagnosis not present

## 2023-07-25 DIAGNOSIS — D123 Benign neoplasm of transverse colon: Secondary | ICD-10-CM | POA: Diagnosis not present

## 2023-07-25 DIAGNOSIS — K2930 Chronic superficial gastritis without bleeding: Secondary | ICD-10-CM | POA: Diagnosis not present

## 2023-07-26 DIAGNOSIS — M25562 Pain in left knee: Secondary | ICD-10-CM | POA: Diagnosis not present

## 2023-07-26 DIAGNOSIS — M1712 Unilateral primary osteoarthritis, left knee: Secondary | ICD-10-CM | POA: Diagnosis not present

## 2023-07-26 DIAGNOSIS — M5459 Other low back pain: Secondary | ICD-10-CM | POA: Diagnosis not present

## 2023-07-28 DIAGNOSIS — M25562 Pain in left knee: Secondary | ICD-10-CM | POA: Diagnosis not present

## 2023-07-28 DIAGNOSIS — M1712 Unilateral primary osteoarthritis, left knee: Secondary | ICD-10-CM | POA: Diagnosis not present

## 2023-07-28 DIAGNOSIS — M5459 Other low back pain: Secondary | ICD-10-CM | POA: Diagnosis not present

## 2023-08-02 DIAGNOSIS — M5459 Other low back pain: Secondary | ICD-10-CM | POA: Diagnosis not present

## 2023-08-02 DIAGNOSIS — M25562 Pain in left knee: Secondary | ICD-10-CM | POA: Diagnosis not present

## 2023-08-02 DIAGNOSIS — M1712 Unilateral primary osteoarthritis, left knee: Secondary | ICD-10-CM | POA: Diagnosis not present

## 2023-08-04 DIAGNOSIS — M1712 Unilateral primary osteoarthritis, left knee: Secondary | ICD-10-CM | POA: Diagnosis not present

## 2023-08-04 DIAGNOSIS — M5459 Other low back pain: Secondary | ICD-10-CM | POA: Diagnosis not present

## 2023-08-04 DIAGNOSIS — M25562 Pain in left knee: Secondary | ICD-10-CM | POA: Diagnosis not present

## 2023-08-09 DIAGNOSIS — M5459 Other low back pain: Secondary | ICD-10-CM | POA: Diagnosis not present

## 2023-08-09 DIAGNOSIS — M25562 Pain in left knee: Secondary | ICD-10-CM | POA: Diagnosis not present

## 2023-08-09 DIAGNOSIS — M1712 Unilateral primary osteoarthritis, left knee: Secondary | ICD-10-CM | POA: Diagnosis not present

## 2023-08-10 DIAGNOSIS — R748 Abnormal levels of other serum enzymes: Secondary | ICD-10-CM | POA: Diagnosis not present

## 2023-08-11 DIAGNOSIS — M5459 Other low back pain: Secondary | ICD-10-CM | POA: Diagnosis not present

## 2023-08-11 DIAGNOSIS — M25562 Pain in left knee: Secondary | ICD-10-CM | POA: Diagnosis not present

## 2023-08-11 DIAGNOSIS — M1712 Unilateral primary osteoarthritis, left knee: Secondary | ICD-10-CM | POA: Diagnosis not present

## 2023-08-17 ENCOUNTER — Telehealth: Payer: Self-pay

## 2023-08-17 NOTE — Telephone Encounter (Signed)
   Pre-operative Risk Assessment    Patient Name: Jacob Dalton  DOB: 1945/09/24 MRN: [id number]   Date of last office visit: 01/12/23 H. Lisabeth Devoid, [place] Date of next office visit: NA   Request for Surgical Clearance    Procedure:   Colonoscopy  Date of Surgery:  Clearance TBD                                 Surgeon:  Not indicated Surgeon's Group or Practice Name:  [facility]  Phone number:[phone] Fax number:  [phone]   Type of Clearance Requested:   - Medical  - Pharmacy:  Hold Clopidogrel (Plavix) 75mg  ( HOLD 5 days)   Type of Anesthesia:  Not Indicated   Additional requests/questions:    Elyse Jarvis   08/17/2023, 4:58 PM

## 2023-08-18 DIAGNOSIS — M25562 Pain in left knee: Secondary | ICD-10-CM | POA: Diagnosis not present

## 2023-08-18 DIAGNOSIS — M5459 Other low back pain: Secondary | ICD-10-CM | POA: Diagnosis not present

## 2023-08-18 DIAGNOSIS — M1712 Unilateral primary osteoarthritis, left knee: Secondary | ICD-10-CM | POA: Diagnosis not present

## 2023-08-18 NOTE — Telephone Encounter (Signed)
   Patient Name: STEWARD SAMES  DOB: 1946-06-07 MRN: [id number]  Primary Cardiologist: Bryan Lemma, MD  Chart reviewed as part of pre-operative protocol coverage. Patient was seen for preoperative telephone visit on 07/07/2023 and was doing well.  He was cleared for colonoscopy at the time. Therefore, given past medical history and time since last visit, based on ACC/AHA guidelines, AURELIANO OSHIELDS is at acceptable risk for the planned procedure without further cardiovascular testing.    Per office protocol, he may hold Plavix for 5 days prior to procedure. Please resume Plavix as soon as possible postprocedure, at the discretion of the surgeon.   I will route this recommendation to the requesting party via Epic fax function and remove from pre-op pool.  Please call with questions.  Joylene Grapes, NP 08/18/2023, 10:40 AM

## 2023-08-23 DIAGNOSIS — M5459 Other low back pain: Secondary | ICD-10-CM | POA: Diagnosis not present

## 2023-08-23 DIAGNOSIS — M1712 Unilateral primary osteoarthritis, left knee: Secondary | ICD-10-CM | POA: Diagnosis not present

## 2023-08-23 DIAGNOSIS — M25562 Pain in left knee: Secondary | ICD-10-CM | POA: Diagnosis not present

## 2023-08-30 DIAGNOSIS — M1712 Unilateral primary osteoarthritis, left knee: Secondary | ICD-10-CM | POA: Diagnosis not present

## 2023-08-30 DIAGNOSIS — M25562 Pain in left knee: Secondary | ICD-10-CM | POA: Diagnosis not present

## 2023-08-30 DIAGNOSIS — M5459 Other low back pain: Secondary | ICD-10-CM | POA: Diagnosis not present

## 2023-09-01 DIAGNOSIS — M25562 Pain in left knee: Secondary | ICD-10-CM | POA: Diagnosis not present

## 2023-09-01 DIAGNOSIS — M1712 Unilateral primary osteoarthritis, left knee: Secondary | ICD-10-CM | POA: Diagnosis not present

## 2023-09-01 DIAGNOSIS — M5459 Other low back pain: Secondary | ICD-10-CM | POA: Diagnosis not present

## 2023-09-06 DIAGNOSIS — R269 Unspecified abnormalities of gait and mobility: Secondary | ICD-10-CM | POA: Diagnosis not present

## 2023-09-06 DIAGNOSIS — G4733 Obstructive sleep apnea (adult) (pediatric): Secondary | ICD-10-CM | POA: Diagnosis not present

## 2023-09-07 DIAGNOSIS — R350 Frequency of micturition: Secondary | ICD-10-CM | POA: Diagnosis not present

## 2023-09-07 DIAGNOSIS — R3915 Urgency of urination: Secondary | ICD-10-CM | POA: Diagnosis not present

## 2023-09-08 DIAGNOSIS — M25562 Pain in left knee: Secondary | ICD-10-CM | POA: Diagnosis not present

## 2023-09-08 DIAGNOSIS — M5459 Other low back pain: Secondary | ICD-10-CM | POA: Diagnosis not present

## 2023-09-08 DIAGNOSIS — M1712 Unilateral primary osteoarthritis, left knee: Secondary | ICD-10-CM | POA: Diagnosis not present

## 2023-09-08 DIAGNOSIS — R748 Abnormal levels of other serum enzymes: Secondary | ICD-10-CM | POA: Diagnosis not present

## 2023-09-12 DIAGNOSIS — M545 Low back pain, unspecified: Secondary | ICD-10-CM | POA: Diagnosis not present

## 2023-09-19 DIAGNOSIS — M7918 Myalgia, other site: Secondary | ICD-10-CM | POA: Diagnosis not present

## 2023-09-19 DIAGNOSIS — M48062 Spinal stenosis, lumbar region with neurogenic claudication: Secondary | ICD-10-CM | POA: Diagnosis not present

## 2023-09-28 DIAGNOSIS — M5416 Radiculopathy, lumbar region: Secondary | ICD-10-CM | POA: Diagnosis not present

## 2023-10-10 DIAGNOSIS — R399 Unspecified symptoms and signs involving the genitourinary system: Secondary | ICD-10-CM | POA: Diagnosis not present

## 2023-10-17 DIAGNOSIS — M48062 Spinal stenosis, lumbar region with neurogenic claudication: Secondary | ICD-10-CM | POA: Diagnosis not present

## 2023-10-17 DIAGNOSIS — M791 Myalgia, unspecified site: Secondary | ICD-10-CM | POA: Diagnosis not present

## 2023-10-17 DIAGNOSIS — M545 Low back pain, unspecified: Secondary | ICD-10-CM | POA: Diagnosis not present

## 2023-10-21 DIAGNOSIS — R399 Unspecified symptoms and signs involving the genitourinary system: Secondary | ICD-10-CM | POA: Diagnosis not present

## 2023-10-21 DIAGNOSIS — E119 Type 2 diabetes mellitus without complications: Secondary | ICD-10-CM | POA: Diagnosis not present

## 2023-10-21 DIAGNOSIS — M48061 Spinal stenosis, lumbar region without neurogenic claudication: Secondary | ICD-10-CM | POA: Diagnosis not present

## 2023-10-24 DIAGNOSIS — Z1330 Encounter for screening examination for mental health and behavioral disorders, unspecified: Secondary | ICD-10-CM | POA: Diagnosis not present

## 2023-10-24 DIAGNOSIS — M5416 Radiculopathy, lumbar region: Secondary | ICD-10-CM | POA: Diagnosis not present

## 2023-10-24 DIAGNOSIS — M479 Spondylosis, unspecified: Secondary | ICD-10-CM | POA: Diagnosis not present

## 2023-10-24 DIAGNOSIS — M4185 Other forms of scoliosis, thoracolumbar region: Secondary | ICD-10-CM | POA: Diagnosis not present

## 2023-10-24 DIAGNOSIS — M4317 Spondylolisthesis, lumbosacral region: Secondary | ICD-10-CM | POA: Diagnosis not present

## 2023-10-24 DIAGNOSIS — M545 Low back pain, unspecified: Secondary | ICD-10-CM | POA: Diagnosis not present

## 2023-10-31 DIAGNOSIS — N390 Urinary tract infection, site not specified: Secondary | ICD-10-CM | POA: Diagnosis not present

## 2023-10-31 DIAGNOSIS — N3941 Urge incontinence: Secondary | ICD-10-CM | POA: Diagnosis not present

## 2023-10-31 DIAGNOSIS — B9620 Unspecified Escherichia coli [E. coli] as the cause of diseases classified elsewhere: Secondary | ICD-10-CM | POA: Diagnosis not present

## 2023-11-09 DIAGNOSIS — M545 Low back pain, unspecified: Secondary | ICD-10-CM | POA: Diagnosis not present

## 2023-12-01 DIAGNOSIS — M4316 Spondylolisthesis, lumbar region: Secondary | ICD-10-CM | POA: Diagnosis not present

## 2023-12-01 DIAGNOSIS — M5416 Radiculopathy, lumbar region: Secondary | ICD-10-CM | POA: Diagnosis not present

## 2023-12-01 DIAGNOSIS — M419 Scoliosis, unspecified: Secondary | ICD-10-CM | POA: Diagnosis not present

## 2023-12-01 DIAGNOSIS — M4186 Other forms of scoliosis, lumbar region: Secondary | ICD-10-CM | POA: Diagnosis not present

## 2023-12-01 DIAGNOSIS — M438X6 Other specified deforming dorsopathies, lumbar region: Secondary | ICD-10-CM | POA: Diagnosis not present

## 2023-12-01 DIAGNOSIS — M5116 Intervertebral disc disorders with radiculopathy, lumbar region: Secondary | ICD-10-CM | POA: Diagnosis not present

## 2023-12-01 DIAGNOSIS — M48061 Spinal stenosis, lumbar region without neurogenic claudication: Secondary | ICD-10-CM | POA: Diagnosis not present

## 2023-12-09 DIAGNOSIS — M5116 Intervertebral disc disorders with radiculopathy, lumbar region: Secondary | ICD-10-CM | POA: Diagnosis not present

## 2023-12-09 DIAGNOSIS — M5441 Lumbago with sciatica, right side: Secondary | ICD-10-CM | POA: Diagnosis not present

## 2023-12-09 DIAGNOSIS — G8929 Other chronic pain: Secondary | ICD-10-CM | POA: Diagnosis not present

## 2023-12-09 DIAGNOSIS — M5416 Radiculopathy, lumbar region: Secondary | ICD-10-CM | POA: Diagnosis not present

## 2023-12-09 DIAGNOSIS — M419 Scoliosis, unspecified: Secondary | ICD-10-CM | POA: Diagnosis not present

## 2023-12-09 DIAGNOSIS — R29898 Other symptoms and signs involving the musculoskeletal system: Secondary | ICD-10-CM | POA: Diagnosis not present

## 2023-12-09 DIAGNOSIS — M51362 Other intervertebral disc degeneration, lumbar region with discogenic back pain and lower extremity pain: Secondary | ICD-10-CM | POA: Diagnosis not present

## 2023-12-16 DIAGNOSIS — M48062 Spinal stenosis, lumbar region with neurogenic claudication: Secondary | ICD-10-CM | POA: Diagnosis not present

## 2023-12-20 DIAGNOSIS — M545 Low back pain, unspecified: Secondary | ICD-10-CM | POA: Diagnosis not present

## 2023-12-27 DIAGNOSIS — M545 Low back pain, unspecified: Secondary | ICD-10-CM | POA: Diagnosis not present

## 2023-12-29 DIAGNOSIS — M545 Low back pain, unspecified: Secondary | ICD-10-CM | POA: Diagnosis not present

## 2024-01-02 DIAGNOSIS — M545 Low back pain, unspecified: Secondary | ICD-10-CM | POA: Diagnosis not present

## 2024-01-05 DIAGNOSIS — M545 Low back pain, unspecified: Secondary | ICD-10-CM | POA: Diagnosis not present

## 2024-01-05 DIAGNOSIS — R059 Cough, unspecified: Secondary | ICD-10-CM | POA: Diagnosis not present

## 2024-01-10 DIAGNOSIS — M545 Low back pain, unspecified: Secondary | ICD-10-CM | POA: Diagnosis not present

## 2024-01-12 DIAGNOSIS — M545 Low back pain, unspecified: Secondary | ICD-10-CM | POA: Diagnosis not present

## 2024-01-16 DIAGNOSIS — J189 Pneumonia, unspecified organism: Secondary | ICD-10-CM | POA: Diagnosis not present

## 2024-01-16 DIAGNOSIS — R9389 Abnormal findings on diagnostic imaging of other specified body structures: Secondary | ICD-10-CM | POA: Diagnosis not present

## 2024-01-17 DIAGNOSIS — M545 Low back pain, unspecified: Secondary | ICD-10-CM | POA: Diagnosis not present

## 2024-01-19 DIAGNOSIS — M545 Low back pain, unspecified: Secondary | ICD-10-CM | POA: Diagnosis not present

## 2024-01-24 DIAGNOSIS — M545 Low back pain, unspecified: Secondary | ICD-10-CM | POA: Diagnosis not present

## 2024-01-26 ENCOUNTER — Other Ambulatory Visit (HOSPITAL_COMMUNITY): Payer: Self-pay | Admitting: Internal Medicine

## 2024-01-26 DIAGNOSIS — R9389 Abnormal findings on diagnostic imaging of other specified body structures: Secondary | ICD-10-CM

## 2024-01-26 DIAGNOSIS — M545 Low back pain, unspecified: Secondary | ICD-10-CM | POA: Diagnosis not present

## 2024-01-26 DIAGNOSIS — J189 Pneumonia, unspecified organism: Secondary | ICD-10-CM | POA: Diagnosis not present

## 2024-01-27 ENCOUNTER — Ambulatory Visit (HOSPITAL_COMMUNITY)

## 2024-01-27 ENCOUNTER — Ambulatory Visit (HOSPITAL_BASED_OUTPATIENT_CLINIC_OR_DEPARTMENT_OTHER)
Admission: RE | Admit: 2024-01-27 | Discharge: 2024-01-27 | Disposition: A | Discharge status: Home / Self Care | Source: Ambulatory Visit | Attending: Internal Medicine | Admitting: Internal Medicine

## 2024-01-27 ENCOUNTER — Other Ambulatory Visit (HOSPITAL_COMMUNITY): Payer: Self-pay | Admitting: Internal Medicine

## 2024-01-27 DIAGNOSIS — R9389 Abnormal findings on diagnostic imaging of other specified body structures: Secondary | ICD-10-CM | POA: Diagnosis not present

## 2024-01-27 DIAGNOSIS — R918 Other nonspecific abnormal finding of lung field: Secondary | ICD-10-CM | POA: Diagnosis not present

## 2024-01-27 DIAGNOSIS — J929 Pleural plaque without asbestos: Secondary | ICD-10-CM | POA: Diagnosis not present

## 2024-01-27 DIAGNOSIS — K769 Liver disease, unspecified: Secondary | ICD-10-CM

## 2024-01-27 DIAGNOSIS — R0600 Dyspnea, unspecified: Secondary | ICD-10-CM | POA: Diagnosis not present

## 2024-01-27 DIAGNOSIS — I3139 Other pericardial effusion (noninflammatory): Secondary | ICD-10-CM | POA: Diagnosis not present

## 2024-02-02 DIAGNOSIS — M545 Low back pain, unspecified: Secondary | ICD-10-CM | POA: Diagnosis not present

## 2024-02-03 ENCOUNTER — Ambulatory Visit (HOSPITAL_COMMUNITY)
Admission: RE | Admit: 2024-02-03 | Discharge: 2024-02-03 | Disposition: A | Discharge status: Home / Self Care | Source: Ambulatory Visit | Attending: Internal Medicine | Admitting: Internal Medicine

## 2024-02-03 DIAGNOSIS — K8689 Other specified diseases of pancreas: Secondary | ICD-10-CM | POA: Diagnosis not present

## 2024-02-03 DIAGNOSIS — E1142 Type 2 diabetes mellitus with diabetic polyneuropathy: Secondary | ICD-10-CM | POA: Diagnosis not present

## 2024-02-03 DIAGNOSIS — K5730 Diverticulosis of large intestine without perforation or abscess without bleeding: Secondary | ICD-10-CM | POA: Diagnosis not present

## 2024-02-03 DIAGNOSIS — N400 Enlarged prostate without lower urinary tract symptoms: Secondary | ICD-10-CM | POA: Diagnosis not present

## 2024-02-03 DIAGNOSIS — K769 Liver disease, unspecified: Secondary | ICD-10-CM | POA: Insufficient documentation

## 2024-02-03 DIAGNOSIS — R932 Abnormal findings on diagnostic imaging of liver and biliary tract: Secondary | ICD-10-CM | POA: Diagnosis not present

## 2024-02-03 LAB — POCT I-STAT CREATININE: Creatinine, Ser: 0.7 mg/dL (ref 0.61–1.24)

## 2024-02-03 MED ORDER — IOHEXOL 300 MG/ML  SOLN
100.0000 mL | Freq: Once | INTRAMUSCULAR | Status: AC | PRN
  Administered 2024-02-03: 100 mL via INTRAVENOUS

## 2024-02-07 ENCOUNTER — Telehealth: Admitting: Acute Care

## 2024-02-07 ENCOUNTER — Encounter: Payer: Self-pay | Admitting: Acute Care

## 2024-02-07 ENCOUNTER — Telehealth: Payer: Self-pay

## 2024-02-07 ENCOUNTER — Ambulatory Visit (INDEPENDENT_AMBULATORY_CARE_PROVIDER_SITE_OTHER): Admitting: Acute Care

## 2024-02-07 ENCOUNTER — Telehealth: Payer: Self-pay | Admitting: Cardiology

## 2024-02-07 VITALS — BP 137/85 | HR 96 | Temp 98.2°F | Ht 69.0 in | Wt 152.0 lb

## 2024-02-07 DIAGNOSIS — M545 Low back pain, unspecified: Secondary | ICD-10-CM | POA: Diagnosis not present

## 2024-02-07 DIAGNOSIS — R9389 Abnormal findings on diagnostic imaging of other specified body structures: Secondary | ICD-10-CM

## 2024-02-07 DIAGNOSIS — R911 Solitary pulmonary nodule: Secondary | ICD-10-CM | POA: Insufficient documentation

## 2024-02-07 DIAGNOSIS — R918 Other nonspecific abnormal finding of lung field: Secondary | ICD-10-CM | POA: Insufficient documentation

## 2024-02-07 DIAGNOSIS — R634 Abnormal weight loss: Secondary | ICD-10-CM | POA: Diagnosis not present

## 2024-02-07 DIAGNOSIS — Z87891 Personal history of nicotine dependence: Secondary | ICD-10-CM | POA: Diagnosis not present

## 2024-02-07 DIAGNOSIS — C3491 Malignant neoplasm of unspecified part of right bronchus or lung: Secondary | ICD-10-CM | POA: Insufficient documentation

## 2024-02-07 NOTE — Telephone Encounter (Signed)
 Pt has been scheduled 8/26 at 12:30 PM.  Stop Plavix  5 days prior.  Case# [id number].  Letter is ready & will need to contact the pt to provide the follow up date .  Waiting on follow up date with Camie from the clinical staff. Letter given to pt by nurse.  Forwarding to [employer] for prior auth

## 2024-02-07 NOTE — Telephone Encounter (Signed)
 Request made to hold Plavix  to Primary Cardiologist - Alm Clay, MD   Office states they will return request via Fax.
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
## 2024-02-07 NOTE — Patient Instructions (Signed)
 It is good to see you today. We have looked at your CT chest , and we have decided the best next step in your care is for a biopsy. I have placed an order for a bronchoscopy with biopsies.  We have discussed the procedure in detail.  We have reviewed the risks and benefits of the procedure. These include bleeding, infection, puncture of the lung, and adverse reaction to anesthesia. You have agreed to proceed with biopsy to evaluate the right  upper lobe nodule. Your procedure will be done by Dr. Lamar Chris. You will hold your plavix  x 5 days. You will receive a letter today with date time and information pertaining to the procedure. You will need someone to drive you to the procedure, stay with you during the procedure, and stay with you after the procedure. You will also need someone to stay with you for 24 hours after anesthesia to ensure you have cleared and are doing well. You will follow-up with me 1 week after the procedure to review the results and to ensure you are doing well. Call if you need us  prior to the procedure or if you have any questions at all. Please contact office for sooner follow up if symptoms do not improve or worsen or seek emergency care

## 2024-02-07 NOTE — Telephone Encounter (Signed)
   Name: Jacob Dalton  DOB: [birth year]  MRN: [id number]  Primary Cardiologist: Alm Clay, MD  Chart reviewed as part of pre-operative protocol coverage. Because of [place] CORELLA past medical history and time since last visit, he will require a follow-up in-office visit in order to better assess preoperative cardiovascular risk.  Pre-op covering staff: - Please schedule appointment and call patient to inform them. If patient already had an upcoming appointment within acceptable timeframe, please add pre-op clearance to the appointment notes so provider is aware. - Please contact requesting surgeon's office via preferred method (i.e, phone, fax) to inform them of need for appointment prior to surgery.  He may hold Plavix  for 5 days prior to procedure. Please resume Plavix  as soon as possible postprocedure, at the discretion of the surgeon.    Lum LITTIE Louis, NP  02/07/2024, 4:39 PM

## 2024-02-07 NOTE — Telephone Encounter (Signed)
 Please schedule the following:  Provider performing procedure: Byrum Diagnosis: Lung mass Which side if for nodule / mass?  Right  Procedure: Navigational bronchoscopy with biopsies  Has patient been spoken to by Provider and given informed consent? Yes, Jacob Dalton Anesthesia:  General Do you need Fluro? Yes Duration of procedure: 1.5 hours Date: 12/17/2023 Alternate Date: next week  Time: Third case of the day if possible Location: [facility]  Does patient have OSA? Yes, wears CPAP DM? Early , but no meds Or Latex allergy? No Medication Restriction/ Anticoagulate/Antiplatelet:  Plavix  will need to be held x 5 days prior to procedure Pre-op Labs Ordered:determined by Anesthesia Imaging request:  CT was done 01/27/2024 (If, SuperDimension CT Chest, please have STAT courier sent to ENDO)

## 2024-02-07 NOTE — Telephone Encounter (Signed)
 Pt has been scheduled to see Jackee Alberts, NP 02/08/24 @ 2:20. Pt has been given address for [employer].
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
## 2024-02-07 NOTE — Telephone Encounter (Signed)
   Pre-operative Risk Assessment    Patient Name: Jacob Dalton  DOB: [birth year] MRN: [id number]   Date of last office visit: 01/12/24 Date of next office visit: n/a   Request for Surgical Clearance    Procedure:  Bronchoscopy   Date of Surgery:  Clearance 02/14/24                                Surgeon:  Dr Shelah  Surgeon's Group or Practice Name:  [facility]  Phone number:  [phone] Fax number:  [phone]   Type of Clearance Requested:   - Medical    Type of Anesthesia:  Not Indicated   Additional requests/questions:     SignedBarbee DELENA Sharps   02/07/2024, 10:07 AM

## 2024-02-08 ENCOUNTER — Encounter: Payer: Self-pay | Admitting: Nurse Practitioner

## 2024-02-08 ENCOUNTER — Ambulatory Visit: Attending: Nurse Practitioner | Admitting: Nurse Practitioner

## 2024-02-08 VITALS — BP 132/72 | HR 87 | Ht 69.0 in | Wt 155.0 lb

## 2024-02-08 DIAGNOSIS — E119 Type 2 diabetes mellitus without complications: Secondary | ICD-10-CM | POA: Insufficient documentation

## 2024-02-08 DIAGNOSIS — E785 Hyperlipidemia, unspecified: Secondary | ICD-10-CM | POA: Diagnosis not present

## 2024-02-08 DIAGNOSIS — I2510 Atherosclerotic heart disease of native coronary artery without angina pectoris: Secondary | ICD-10-CM | POA: Diagnosis not present

## 2024-02-08 DIAGNOSIS — Z01810 Encounter for preprocedural cardiovascular examination: Secondary | ICD-10-CM | POA: Diagnosis not present

## 2024-02-08 MED ORDER — NITROGLYCERIN 0.4 MG SL SUBL: 0.4000 mg | SUBLINGUAL_TABLET | SUBLINGUAL | 3 refills | Status: AC | PRN

## 2024-02-08 NOTE — Patient Instructions (Addendum)
 Medication Instructions:  Your physician recommends that you continue on your current medications as directed. Please refer to the Current Medication list given to you today. *If you need a refill on your cardiac medications before your next appointment, please call your pharmacy*  Lab Work: NONE ORDERED If you have labs (blood work) drawn today and your tests are completely normal, you will receive your results only by: MyChart Message (if you have MyChart) OR A paper copy in the mail If you have any lab test that is abnormal or we need to change your treatment, we will call you to review the results.  Testing/Procedures: NONE ORDERED  Follow-Up: At [facility], you and your health needs are our priority.  As part of our continuing mission to provide you with exceptional heart care, our providers are all part of one team.  This team includes your primary Cardiologist (physician) and Advanced Practice Providers or APPs (Physician Assistants and Nurse Practitioners) who all work together to provide you with the care you need, when you need it.  Your next appointment:   FIRST AVAILABLE  Provider:   Alm Clay, MD    We recommend signing up for the patient portal called MyChart.  Sign up information is provided on this After Visit Summary.  MyChart is used to connect with patients for Virtual Visits (Telemedicine).  Patients are able to view lab/test results, encounter notes, upcoming appointments, etc.  Non-urgent messages can be sent to your provider as well.   To learn more about what you can do with MyChart, go to [email or web address].   Other Instructions YOU ARE CLEARED FOR YOUR PROCEDURE

## 2024-02-09 ENCOUNTER — Encounter: Payer: Self-pay | Admitting: Oncology

## 2024-02-09 ENCOUNTER — Inpatient Hospital Stay

## 2024-02-09 ENCOUNTER — Inpatient Hospital Stay: Attending: Oncology | Admitting: Oncology

## 2024-02-09 VITALS — BP 130/72 | HR 83 | Temp 98.4°F | Resp 17 | Ht 69.0 in | Wt 154.0 lb

## 2024-02-09 DIAGNOSIS — G629 Polyneuropathy, unspecified: Secondary | ICD-10-CM | POA: Insufficient documentation

## 2024-02-09 DIAGNOSIS — K219 Gastro-esophageal reflux disease without esophagitis: Secondary | ICD-10-CM | POA: Diagnosis not present

## 2024-02-09 DIAGNOSIS — R630 Anorexia: Secondary | ICD-10-CM | POA: Insufficient documentation

## 2024-02-09 DIAGNOSIS — C7951 Secondary malignant neoplasm of bone: Secondary | ICD-10-CM | POA: Insufficient documentation

## 2024-02-09 DIAGNOSIS — R0981 Nasal congestion: Secondary | ICD-10-CM | POA: Diagnosis not present

## 2024-02-09 DIAGNOSIS — K760 Fatty (change of) liver, not elsewhere classified: Secondary | ICD-10-CM | POA: Insufficient documentation

## 2024-02-09 DIAGNOSIS — Z9049 Acquired absence of other specified parts of digestive tract: Secondary | ICD-10-CM | POA: Insufficient documentation

## 2024-02-09 DIAGNOSIS — K5730 Diverticulosis of large intestine without perforation or abscess without bleeding: Secondary | ICD-10-CM | POA: Diagnosis not present

## 2024-02-09 DIAGNOSIS — Z885 Allergy status to narcotic agent status: Secondary | ICD-10-CM | POA: Insufficient documentation

## 2024-02-09 DIAGNOSIS — E119 Type 2 diabetes mellitus without complications: Secondary | ICD-10-CM | POA: Diagnosis not present

## 2024-02-09 DIAGNOSIS — Z955 Presence of coronary angioplasty implant and graft: Secondary | ICD-10-CM | POA: Insufficient documentation

## 2024-02-09 DIAGNOSIS — M545 Low back pain, unspecified: Secondary | ICD-10-CM | POA: Diagnosis not present

## 2024-02-09 DIAGNOSIS — C787 Secondary malignant neoplasm of liver and intrahepatic bile duct: Secondary | ICD-10-CM | POA: Insufficient documentation

## 2024-02-09 DIAGNOSIS — R918 Other nonspecific abnormal finding of lung field: Secondary | ICD-10-CM

## 2024-02-09 DIAGNOSIS — R053 Chronic cough: Secondary | ICD-10-CM | POA: Insufficient documentation

## 2024-02-09 DIAGNOSIS — Z7902 Long term (current) use of antithrombotics/antiplatelets: Secondary | ICD-10-CM | POA: Diagnosis not present

## 2024-02-09 DIAGNOSIS — R634 Abnormal weight loss: Secondary | ICD-10-CM | POA: Insufficient documentation

## 2024-02-09 DIAGNOSIS — G4730 Sleep apnea, unspecified: Secondary | ICD-10-CM | POA: Diagnosis not present

## 2024-02-09 DIAGNOSIS — I2510 Atherosclerotic heart disease of native coronary artery without angina pectoris: Secondary | ICD-10-CM | POA: Insufficient documentation

## 2024-02-09 DIAGNOSIS — I3139 Other pericardial effusion (noninflammatory): Secondary | ICD-10-CM | POA: Diagnosis not present

## 2024-02-09 DIAGNOSIS — C3411 Malignant neoplasm of upper lobe, right bronchus or lung: Secondary | ICD-10-CM | POA: Insufficient documentation

## 2024-02-09 DIAGNOSIS — Z886 Allergy status to analgesic agent status: Secondary | ICD-10-CM | POA: Insufficient documentation

## 2024-02-09 DIAGNOSIS — Z8249 Family history of ischemic heart disease and other diseases of the circulatory system: Secondary | ICD-10-CM | POA: Insufficient documentation

## 2024-02-09 DIAGNOSIS — R0982 Postnasal drip: Secondary | ICD-10-CM | POA: Diagnosis not present

## 2024-02-09 DIAGNOSIS — I700 Atherosclerosis of aorta: Secondary | ICD-10-CM | POA: Insufficient documentation

## 2024-02-09 DIAGNOSIS — Z79899 Other long term (current) drug therapy: Secondary | ICD-10-CM | POA: Insufficient documentation

## 2024-02-09 DIAGNOSIS — K8689 Other specified diseases of pancreas: Secondary | ICD-10-CM | POA: Diagnosis not present

## 2024-02-09 DIAGNOSIS — I10 Essential (primary) hypertension: Secondary | ICD-10-CM | POA: Insufficient documentation

## 2024-02-09 DIAGNOSIS — Z903 Acquired absence of stomach [part of]: Secondary | ICD-10-CM | POA: Insufficient documentation

## 2024-02-09 DIAGNOSIS — M8580 Other specified disorders of bone density and structure, unspecified site: Secondary | ICD-10-CM | POA: Insufficient documentation

## 2024-02-09 DIAGNOSIS — N400 Enlarged prostate without lower urinary tract symptoms: Secondary | ICD-10-CM | POA: Insufficient documentation

## 2024-02-09 DIAGNOSIS — J90 Pleural effusion, not elsewhere classified: Secondary | ICD-10-CM | POA: Diagnosis not present

## 2024-02-09 DIAGNOSIS — R262 Difficulty in walking, not elsewhere classified: Secondary | ICD-10-CM | POA: Diagnosis not present

## 2024-02-09 DIAGNOSIS — Z881 Allergy status to other antibiotic agents status: Secondary | ICD-10-CM | POA: Insufficient documentation

## 2024-02-09 LAB — CBC WITH DIFFERENTIAL (CANCER CENTER ONLY)
Abs Immature Granulocytes: 0.01 K/uL (ref 0.00–0.07)
Basophils Absolute: 0 K/uL (ref 0.0–0.1)
Basophils Relative: 1 %
Eosinophils Absolute: 0.2 K/uL (ref 0.0–0.5)
Eosinophils Relative: 3 %
HCT: 40.8 % (ref 39.0–52.0)
Hemoglobin: 14 g/dL (ref 13.0–17.0)
Immature Granulocytes: 0 %
Lymphocytes Relative: 14 %
Lymphs Abs: 0.8 K/uL (ref 0.7–4.0)
MCH: 29 pg (ref 26.0–34.0)
MCHC: 34.3 g/dL (ref 30.0–36.0)
MCV: 84.5 fL (ref 80.0–100.0)
Monocytes Absolute: 0.6 K/uL (ref 0.1–1.0)
Monocytes Relative: 10 %
Neutro Abs: 4 K/uL (ref 1.7–7.7)
Neutrophils Relative %: 72 %
Platelet Count: 171 K/uL (ref 150–400)
RBC: 4.83 MIL/uL (ref 4.22–5.81)
RDW: 12.5 % (ref 11.5–15.5)
WBC Count: 5.5 K/uL (ref 4.0–10.5)
nRBC: 0 % (ref 0.0–0.2)

## 2024-02-09 LAB — CMP (CANCER CENTER ONLY)
ALT: 21 U/L (ref 0–44)
AST: 19 U/L (ref 15–41)
Albumin: 4.1 g/dL (ref 3.5–5.0)
Alkaline Phosphatase: 136 U/L — ABNORMAL HIGH (ref 38–126)
Anion gap: 6 (ref 5–15)
BUN: 13 mg/dL (ref 8–23)
CO2: 29 mmol/L (ref 22–32)
Calcium: 9.1 mg/dL (ref 8.9–10.3)
Chloride: 104 mmol/L (ref 98–111)
Creatinine: 0.89 mg/dL (ref 0.61–1.24)
GFR, Estimated: >60 mL/min (ref >60)
Glucose, Bld: 147 mg/dL — ABNORMAL HIGH (ref 70–99)
Potassium: 4.2 mmol/L (ref 3.5–5.1)
Sodium: 139 mmol/L (ref 135–145)
Total Bilirubin: 0.5 mg/dL (ref 0.0–1.2)
Total Protein: 6.6 g/dL (ref 6.5–8.1)

## 2024-02-09 LAB — LACTATE DEHYDROGENASE: LDH: 78 U/L — ABNORMAL LOW (ref 98–192)

## 2024-02-09 NOTE — Progress Notes (Unsigned)
 NN reached out to pt's primary phone, however there was no answer and VMB was full. NN was then able to reach pt's wife, Rock. NN made self-introduction and explained the role of a NN. NN let Rock know that Dr. Autumn reached out to inform NN of the plan and the patients needs for a continu

## 2024-02-09 NOTE — Progress Notes (Signed)
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 with areas of intralobular septal thickening. Small right pleural effusion. No pneumothorax. 3 mm right lower lobe nodule (axial 77). 3 mm medial basal right lower lobe nodule (axial 95). Musculoskeletal: Diffuse osteopenia. Asymmetric sclerosis within the lateral right sternomanubrial joint (axial 49), measuring 1.9 cm. Eccentric dense bony sclerosis of the left lateral T9 vertebral body. A couple of small sclerotic lesions in T2, T3, T8, T12, and L1. Upper Abdomen: Heterogeneity of the liver parenchyma with a couple of small hypodensities for example, in the left hepatic lobe (axial 129), measuring 1.3 cm. IMPRESSION: 1. Large, masslike peribronchial consolidation in the right upper lobe, measuring 4.2 x 7.3 x 7.5 cm, especially worrisome for primary lung malignancy. Interlobular septal thickening and areas of micronodularity throughout the remainder of the right upper lobe is worrisome for lymphangitic spread of disease. 2. Bony sclerosis in the  lateral right sternomanubrial joint and scattered throughout the visualized thoracolumbar spine (the largest lesion in the left lateral T9 vertebral body), worrisome for metastatic disease. 3. Heterogeneous appearance of the liver parenchyma with a couple of small hypodensities (measuring up to 1.3 cm in the left hepatic lobe). A nonemergent CT of the abdomen and pelvis with IV contrast is recommended to assess for metastatic disease. Critical Value/emergent results were called by telephone at the time of interpretation on 01/27/2024 at 1:54 pm to provider [facility] , who verbally acknowledged these results. Aortic Atherosclerosis (ICD10-I70.0). Electronically Signed   By: Rogelia Myers M.D.   On: 01/27/2024 13:57    Orders Placed This Encounter  Procedures   CT HEAD W & WO CONTRAST ( )    Standing Status:   Future    Expected Date:   02/13/2024    Expiration Date:   02/08/2025    If indicated for the ordered procedure, I authorize the administration of contrast media per Radiology protocol:   Yes    Does the patient have a contrast media/X-ray dye allergy?:   No    Preferred imaging location?:   [facility]   NM PET Image Initial (PI) Skull Base To Thigh    Standing Status:   Future    Expected Date:   02/16/2024    Expiration Date:   02/08/2025    If indicated for the ordered procedure, I authorize the administration of a radiopharmaceutical per Radiology protocol:   Yes    Preferred imaging location?:   [facility]   CBC with Differential (Cancer Center Only)    Standing Status:   Future    Number of Occurrences:   1    Expiration Date:   02/08/2025   CMP (Cancer Center only)    Standing Status:   Future    Number of Occurrences:   1    Expiration Date:   02/08/2025   Lactate dehydrogenase    Standing Status:   Future    Number of Occurrences:   1    Expiration Date:   02/08/2025    CODE STATUS:   Future Appointments  Date Time Provider Department Center  02/22/2024  10:00 AM Ruthell Lauraine FALCON, NP [facility] None  02/23/2024 10:30 AM [facility] 2 [facility] [place]  02/23/2024 12:00 PM [facility] PET CT 1 [facility]   02/29/2024  2:45 PM Ludean Duhart, MD [facility] None     I spent a total of 55 minutes during this encounter with the patient including  review of chart and various tests results, discussions about plan of care and coordination of care plan.  This document was completed utilizing speech recognition software. Grammatical errors, random word insertions, pronoun errors, and incomplete sentences are an occasional consequence of this system due to software limitations, ambient noise, and hardware issues. Any formal questions or concerns about the content, text or information contained within the body of this dictation should be directly addressed to the provider for clarification.

## 2024-02-09 NOTE — Assessment & Plan Note (Signed)
 CT scan reveals a mass in the right upper lobe with possible lung collapse due to airway obstruction. 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 Differential diagnosis includes non-small cell lung cancer, likely adenocarcinoma, versus small cell lung cancer. Biopsy is scheduled on 02/14/2024 for definitive diagnosis. PET scan and CT of the head are planned to evaluate disease extent. Surgery is not feasible due to concern for metastasis. Treatment options include targeted therapy if a mutation is identified, or immunotherapy.  Chemotherapy will be considered if no actionable mutations are noted and we will discuss this further once we have biopsy results.  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  Patient and his wife verbalized understanding.  He is scheduled for lung biopsy on 02/14/2024.  We will request mutational testing on the specimen, if malignancy is confirmed. - Order PET scan post-biopsy confirmation - Order CT scan of the head for staging, since patient cannot have MRI because of metal hardware.  - Schedule follow-up appointment in three weeks, adjust based on results availability

## 2024-02-09 NOTE — Telephone Encounter (Signed)
 Thank you :)

## 2024-02-10 ENCOUNTER — Encounter (HOSPITAL_COMMUNITY): Payer: Self-pay | Admitting: Emergency Medicine

## 2024-02-10 ENCOUNTER — Telehealth: Payer: Self-pay | Admitting: Acute Care

## 2024-02-10 ENCOUNTER — Encounter: Payer: Self-pay | Admitting: Hematology and Oncology

## 2024-02-10 ENCOUNTER — Other Ambulatory Visit: Payer: Self-pay

## 2024-02-10 NOTE — Addendum Note (Signed)
 Addended by: MELVENIA WILFORD SAUNDERS on: 02/10/2024 01:36 PM   Modules accepted: Orders

## 2024-02-10 NOTE — Telephone Encounter (Signed)
 I have called the patient to let him know to hold his Lyrica the night before his surgery and the day of his surgery. We were unaware he was taking this medication. Pt. Wife realized it had not been added to his med list and let Jacob Salmon RN know at an appointment today with oncology. Megan let us  know, which we appreciated, and we have spoken to both Jacob Dalton and his wife. They will hold the lyrica the night before and the morning of his procedure on 02/14/2024.

## 2024-02-10 NOTE — Progress Notes (Signed)
 SDW call  Patient was given pre-op instructions over the phone. Patient verbalized understanding of instructions provided.     PCP - Dr. Dorn Sauers Cardiologist - Dr. Alm Clay, LOV 02/08/2024 Pulmonary:    PPM/ICD - denies Device Orders - na Rep Notified - na   Chest x-ray - na EKG -  02/08/2024 Stress Test - 03/24/2009 ECHO - 04/09/2013 Cardiac Cath - 12/08/2009  Sleep Study/sleep apnea/CPAP: OSA with nightly CPAP  Type II diabetic.  Does not take medication and does not check sugars Fasting Blood sugar range: na How often check sugars: na  Blood Thinner Instructions: Plavix , hold 5 days, states last dose 02/08/2024 Aspirin  Instructions:denies   ERAS Protcol -  NPO   Anesthesia review: Yes. CAD, HTN, DM, unstabel angina, OSA - CPAP   Patient denies shortness of breath, fever, cough and chest pain over the phone call  Your procedure is scheduled on Tuesday February 14, 2024  Report to [facility] Main Entrance A at  1015  A.M., then check in with the Admitting office.  Call this number if you have problems the morning of surgery:  [phone]   If you have any questions prior to your surgery date call [phone]: Open Monday-Friday 8am-4pm If you experience any cold or flu symptoms such as cough, fever, chills, shortness of breath, etc. between now and your scheduled surgery, please notify us  at the above number     Remember:  Do not eat or drink after midnight the night before your surgery  Take these medicines the morning of surgery with A SIP OF WATER:  Wellbutrin, gemtesa  As needed: Tylenol , flonase  As of today, STOP taking any Aspirin  (unless otherwise instructed by your surgeon) Aleve, Naproxen, Ibuprofen, Motrin, Advil, Goody's, BC's, all herbal medications, fish oil, and all vitamins.

## 2024-02-10 NOTE — Progress Notes (Signed)
 I agree with the plans as outlined above.   Lamar Chris, MD, PhD 02/10/2024, 6:52 PM [place] [facility] [phone] or if no answer before 7:00PM call [phone] For any issues after 7:00PM please call eLink [phone]

## 2024-02-13 NOTE — Anesthesia Preprocedure Evaluation (Signed)
 Anesthesia Evaluation  Patient identified by MRN, date of birth, ID band Patient awake    Reviewed: Allergy & Precautions, NPO status , Patient's Chart, lab work & pertinent test results  Airway Mallampati: III       Dental no notable dental hx.    Pulmonary sleep apnea and Continuous Positive Airway Pressure Ventilation , former smoker   Pulmonary exam normal        Cardiovascular hypertension, + CAD and + Cardiac Stents  Normal cardiovascular exam     Neuro/Psych  PSYCHIATRIC DISORDERS Anxiety Depression     Neuromuscular disease    GI/Hepatic   Endo/Other  diabetes    Renal/GU      Musculoskeletal   Abdominal   Peds  Hematology  (+) Blood dyscrasia (Plavix )   Anesthesia Other Findings lung mass  Reproductive/Obstetrics                              Anesthesia Physical Anesthesia Plan  ASA: 3  Anesthesia Plan: General   Post-op Pain Management:    Induction: Intravenous  PONV Risk Score and Plan: 2 and Ondansetron , Dexamethasone  and Treatment may vary due to age or medical condition  Airway Management Planned:   Additional Equipment:   Intra-op Plan:   Post-operative Plan: Extubation in OR  Informed Consent: I have reviewed the patients History and Physical, chart, labs and discussed the procedure including the risks, benefits and alternatives for the proposed anesthesia with the patient or authorized representative who has indicated his/her understanding and acceptance.     Dental advisory given  Plan Discussed with: CRNA  Anesthesia Plan Comments: (PAT note written 02/13/2024 by Taber Sweetser, PA-C.  )         Anesthesia Quick Evaluation
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
## 2024-02-14 ENCOUNTER — Ambulatory Visit (HOSPITAL_COMMUNITY): Payer: Self-pay | Admitting: Vascular Surgery

## 2024-02-14 ENCOUNTER — Other Ambulatory Visit: Payer: Self-pay

## 2024-02-14 ENCOUNTER — Ambulatory Visit (HOSPITAL_COMMUNITY)
Admission: RE | Admit: 2024-02-14 | Discharge: 2024-02-14 | Disposition: A | Discharge status: Home / Self Care | Attending: Emergency Medicine | Admitting: Emergency Medicine

## 2024-02-14 ENCOUNTER — Encounter (HOSPITAL_COMMUNITY): Payer: Self-pay | Admitting: Emergency Medicine

## 2024-02-14 ENCOUNTER — Encounter (HOSPITAL_COMMUNITY)
Admission: RE | Disposition: A | Discharge status: Home / Self Care | Payer: Self-pay | Source: Home / Self Care | Attending: Emergency Medicine

## 2024-02-14 ENCOUNTER — Ambulatory Visit (HOSPITAL_COMMUNITY)

## 2024-02-14 ENCOUNTER — Ambulatory Visit (HOSPITAL_BASED_OUTPATIENT_CLINIC_OR_DEPARTMENT_OTHER): Payer: Self-pay | Admitting: Vascular Surgery

## 2024-02-14 DIAGNOSIS — K219 Gastro-esophageal reflux disease without esophagitis: Secondary | ICD-10-CM | POA: Insufficient documentation

## 2024-02-14 DIAGNOSIS — E1142 Type 2 diabetes mellitus with diabetic polyneuropathy: Secondary | ICD-10-CM | POA: Diagnosis not present

## 2024-02-14 DIAGNOSIS — C3411 Malignant neoplasm of upper lobe, right bronchus or lung: Secondary | ICD-10-CM | POA: Insufficient documentation

## 2024-02-14 DIAGNOSIS — C3492 Malignant neoplasm of unspecified part of left bronchus or lung: Secondary | ICD-10-CM | POA: Diagnosis not present

## 2024-02-14 DIAGNOSIS — Z7902 Long term (current) use of antithrombotics/antiplatelets: Secondary | ICD-10-CM | POA: Diagnosis not present

## 2024-02-14 DIAGNOSIS — R918 Other nonspecific abnormal finding of lung field: Secondary | ICD-10-CM | POA: Diagnosis not present

## 2024-02-14 DIAGNOSIS — Z87891 Personal history of nicotine dependence: Secondary | ICD-10-CM | POA: Diagnosis not present

## 2024-02-14 DIAGNOSIS — R911 Solitary pulmonary nodule: Secondary | ICD-10-CM | POA: Insufficient documentation

## 2024-02-14 DIAGNOSIS — Z48813 Encounter for surgical aftercare following surgery on the respiratory system: Secondary | ICD-10-CM | POA: Diagnosis not present

## 2024-02-14 DIAGNOSIS — I700 Atherosclerosis of aorta: Secondary | ICD-10-CM | POA: Insufficient documentation

## 2024-02-14 DIAGNOSIS — I2510 Atherosclerotic heart disease of native coronary artery without angina pectoris: Secondary | ICD-10-CM

## 2024-02-14 DIAGNOSIS — I10 Essential (primary) hypertension: Secondary | ICD-10-CM | POA: Insufficient documentation

## 2024-02-14 DIAGNOSIS — E119 Type 2 diabetes mellitus without complications: Secondary | ICD-10-CM | POA: Insufficient documentation

## 2024-02-14 DIAGNOSIS — C3491 Malignant neoplasm of unspecified part of right bronchus or lung: Secondary | ICD-10-CM | POA: Diagnosis present

## 2024-02-14 DIAGNOSIS — G4730 Sleep apnea, unspecified: Secondary | ICD-10-CM | POA: Diagnosis not present

## 2024-02-14 HISTORY — DX: Sleep apnea, unspecified: G47.30

## 2024-02-14 HISTORY — PX: VIDEO BRONCHOSCOPY WITH ENDOBRONCHIAL NAVIGATION: SHX6175

## 2024-02-14 LAB — GLUCOSE, CAPILLARY
Glucose-Capillary: 178 mg/dL — ABNORMAL HIGH (ref 70–99)
Glucose-Capillary: 135 mg/dL — ABNORMAL HIGH (ref 70–99)
Glucose-Capillary: 122 mg/dL — ABNORMAL HIGH (ref 70–99)

## 2024-02-14 SURGERY — VIDEO BRONCHOSCOPY WITH ENDOBRONCHIAL NAVIGATION
Anesthesia: General | Laterality: Right

## 2024-02-14 MED ORDER — ONDANSETRON HCL 4 MG/2ML IJ SOLN
INTRAMUSCULAR | Status: DC | PRN
  Administered 2024-02-14: 4 mg via INTRAVENOUS

## 2024-02-14 MED ORDER — DEXAMETHASONE SODIUM PHOSPHATE 10 MG/ML IJ SOLN
INTRAMUSCULAR | Status: DC | PRN
  Administered 2024-02-14: 10 mg via INTRAVENOUS

## 2024-02-14 MED ORDER — FENTANYL CITRATE (PF) 100 MCG/2ML IJ SOLN: 25.0000 ug | INTRAMUSCULAR | Status: DC | PRN

## 2024-02-14 MED ORDER — AMISULPRIDE (ANTIEMETIC) 5 MG/2ML IV SOLN
INTRAVENOUS | Status: AC
  Filled 2024-02-14: qty 4

## 2024-02-14 MED ORDER — CHLORHEXIDINE GLUCONATE 0.12 % MT SOLN: 15.0000 mL | Freq: Once | OROMUCOSAL | Status: AC

## 2024-02-14 MED ORDER — CHLORHEXIDINE GLUCONATE 0.12 % MT SOLN
OROMUCOSAL | Status: AC
  Administered 2024-02-14: 15 mL via OROMUCOSAL
  Filled 2024-02-14: qty 15

## 2024-02-14 MED ORDER — FAMOTIDINE IN NACL 20-0.9 MG/50ML-% IV SOLN: 20.0000 mg | Freq: Once | INTRAVENOUS | Status: AC

## 2024-02-14 MED ORDER — ACETAMINOPHEN 10 MG/ML IV SOLN: 1000.0000 mg | Freq: Once | INTRAVENOUS | Status: DC | PRN

## 2024-02-14 MED ORDER — ONDANSETRON HCL 4 MG/2ML IJ SOLN
4.0000 mg | Freq: Once | INTRAMUSCULAR | Status: DC | PRN
Start: 2024-02-14 — End: 2024-02-14

## 2024-02-14 MED ORDER — PHENYLEPHRINE HCL-NACL 20-0.9 MG/250ML-% IV SOLN
INTRAVENOUS | Status: DC | PRN
  Administered 2024-02-14: 20 ug/min via INTRAVENOUS

## 2024-02-14 MED ORDER — FENTANYL CITRATE (PF) 100 MCG/2ML IJ SOLN
INTRAMUSCULAR | Status: AC
  Filled 2024-02-14: qty 2

## 2024-02-14 MED ORDER — FAMOTIDINE IN NACL 20-0.9 MG/50ML-% IV SOLN
INTRAVENOUS | Status: AC
  Administered 2024-02-14: 20 mg via INTRAVENOUS
  Filled 2024-02-14: qty 50

## 2024-02-14 MED ORDER — LACTATED RINGERS IV SOLN: INTRAVENOUS | Status: DC

## 2024-02-14 MED ORDER — FENTANYL CITRATE (PF) 250 MCG/5ML IJ SOLN
INTRAMUSCULAR | Status: DC | PRN
  Administered 2024-02-14 (×2): 50 ug via INTRAVENOUS

## 2024-02-14 MED ORDER — ROCURONIUM BROMIDE 10 MG/ML (PF) SYRINGE
PREFILLED_SYRINGE | INTRAVENOUS | Status: DC | PRN
  Administered 2024-02-14: 40 mg via INTRAVENOUS
  Administered 2024-02-14: 10 mg via INTRAVENOUS

## 2024-02-14 MED ORDER — SUGAMMADEX SODIUM 200 MG/2ML IV SOLN
INTRAVENOUS | Status: DC | PRN
  Administered 2024-02-14: 200 mg via INTRAVENOUS

## 2024-02-14 MED ORDER — AMISULPRIDE (ANTIEMETIC) 5 MG/2ML IV SOLN
10.0000 mg | Freq: Once | INTRAVENOUS | Status: AC | PRN
  Administered 2024-02-14: 10 mg via INTRAVENOUS

## 2024-02-14 MED ORDER — LIDOCAINE 2% (20 MG/ML) 5 ML SYRINGE
INTRAMUSCULAR | Status: DC | PRN
  Administered 2024-02-14: 60 mg via INTRAVENOUS

## 2024-02-14 MED ORDER — PROPOFOL 10 MG/ML IV BOLUS
INTRAVENOUS | Status: DC | PRN
  Administered 2024-02-14: 200 mg via INTRAVENOUS

## 2024-02-14 MED ORDER — PHENYLEPHRINE 80 MCG/ML (10ML) SYRINGE FOR IV PUSH (FOR BLOOD PRESSURE SUPPORT)
PREFILLED_SYRINGE | INTRAVENOUS | Status: DC | PRN
  Administered 2024-02-14: 80 ug via INTRAVENOUS

## 2024-02-14 SURGICAL SUPPLY — 36 items
ADAPTER BRONCHOSCOPE OLYMPUS (ADAPTER) ×1 IMPLANT
ADAPTER VALVE BIOPSY EBUS (MISCELLANEOUS) IMPLANT
BAG COUNTER SPONGE SURGICOUNT (BAG) ×1 IMPLANT
BRUSH CYTOL CELLEBRITY 1.5X140 (MISCELLANEOUS) ×1 IMPLANT
BRUSH SUPERTRAX BIOPSY (INSTRUMENTS) IMPLANT
BRUSH SUPERTRAX NDL-TIP CYTO (INSTRUMENTS) ×1 IMPLANT
CANISTER SUCTION 3000ML PPV (SUCTIONS) ×1 IMPLANT
CNTNR URN SCR LID CUP LEK RST (MISCELLANEOUS) ×1 IMPLANT
COVER BACK TABLE 60X90IN (DRAPES) ×1 IMPLANT
FILTER STRAW FLUID ASPIR (MISCELLANEOUS) IMPLANT
FORCEPS BIOP 1.5 SINGLE USE (MISCELLANEOUS) ×1 IMPLANT
FORCEPS BIOP SUPERTRX PREMAR (INSTRUMENTS) ×1 IMPLANT
GAUZE SPONGE 4X4 12PLY STRL (GAUZE/BANDAGES/DRESSINGS) ×1 IMPLANT
GLOVE BIO SURGEON STRL SZ7.5 (GLOVE) ×2 IMPLANT
GOWN STRL REUS W/ TWL LRG LVL3 (GOWN DISPOSABLE) ×2 IMPLANT
KIT CLEAN ENDO COMPLIANCE (KITS) ×1 IMPLANT
KIT LOCATABLE GUIDE (CANNULA) IMPLANT
KIT MARKER FIDUCIAL DELIVERY (KITS) IMPLANT
KIT TURNOVER KIT B (KITS) ×1 IMPLANT
MARKER SKIN DUAL TIP RULER LAB (MISCELLANEOUS) ×1 IMPLANT
NDL SUPERTRX PREMARK BIOPSY (NEEDLE) ×1 IMPLANT
NEEDLE SUPERTRX PREMARK BIOPSY (NEEDLE) ×1 IMPLANT
NS IRRIG 1000ML POUR BTL (IV SOLUTION) ×1 IMPLANT
OIL SILICONE PENTAX (PARTS (SERVICE/REPAIRS)) ×1 IMPLANT
PAD ARMBOARD POSITIONER FOAM (MISCELLANEOUS) ×2 IMPLANT
PATCHES PATIENT (LABEL) ×3 IMPLANT
SYR 20ML ECCENTRIC (SYRINGE) ×1 IMPLANT
SYR 20ML LL LF (SYRINGE) ×1 IMPLANT
SYR 50ML SLIP (SYRINGE) ×1 IMPLANT
TOWEL GREEN STERILE FF (TOWEL DISPOSABLE) ×1 IMPLANT
TRAP SPECIMEN MUCUS 40CC (MISCELLANEOUS) IMPLANT
TUBE CONNECTING 20X1/4 (TUBING) ×1 IMPLANT
UNDERPAD 30X36 HEAVY ABSORB (UNDERPADS AND DIAPERS) ×1 IMPLANT
VALVE BIOPSY SINGLE USE (MISCELLANEOUS) ×1 IMPLANT
VALVE SUCTION BRONCHIO DISP (MISCELLANEOUS) ×1 IMPLANT
WATER STERILE IRR 1000ML POUR (IV SOLUTION) ×1 IMPLANT

## 2024-02-14 NOTE — Op Note (Signed)
 Video Bronchoscopy with Robotic Assisted Bronchoscopic Navigation   Date of Operation: 02/14/2024   Pre-op Diagnosis: Right upper lobe mass  Post-op Diagnosis: Same  Surgeon: Lamar Chris  Assistants: None  Anesthesia: General endotracheal anesthesia  Operation: Flexible video fiberoptic bronchoscopy with robotic assistance and biopsies.  Estimated Blood Loss: Minimal  Complications: None  Indications and History: Jacob Dalton is a 78 y.o. male with history of CAD, former pipe smoker, Agent Orange exposure in [place].  Found to have a right upper lobe mass on chest imaging with concern for possible metastatic disease.  Recommendation made to achieve a tissue diagnosis via robotic assisted navigational bronchoscopy.  The risks, benefits, complications, treatment options and expected outcomes were discussed with the patient.  The possibilities of pneumothorax, pneumonia, reaction to medication, pulmonary aspiration, perforation of a viscus, bleeding, failure to diagnose a condition and creating a complication requiring transfusion or operation were discussed with the patient who freely signed the consent.    Description of Procedure: The patient was seen in the Preoperative Area, was examined and was deemed appropriate to proceed.  The patient was taken to [facility] Endoscopy room 3, identified as [name] and the procedure verified as Flexible Video Fiberoptic Bronchoscopy.  A Time Out was held and the above information confirmed.   Prior to the date of the procedure a high-resolution CT scan of the chest was performed. Utilizing ION software program a virtual tracheobronchial tree was generated to allow the creation of distinct navigation pathways to the patient's parenchymal abnormalities. After being taken to the operating room general anesthesia was initiated and the patient  was orally intubated. The video fiberoptic bronchoscope was introduced via the endotracheal tube and a general  inspection was performed which showed normal left lung anatomy.  The right upper lobe airway was hypervascular and irregular.  The segmental airways were somewhat narrowed without any obvious endobronchial lesion.  The mucosal irregularity extended into the proximal bronchus intermedius.  Aspiration of the bilateral mainstems was completed to remove any remaining secretions. Robotic catheter inserted into patient's endotracheal tube.   Target #1 right upper lobe mass: The distinct navigation pathways prepared prior to this procedure were then utilized to navigate to patient's lesion identified on CT scan. The robotic catheter was secured into place and the vision probe was withdrawn.  Lesion location was approximated using fluoroscopy.  Local registration and targeting was performed using Siem[employer] Cios mobile C-arm three-dimensional imaging. Under fluoroscopic guidance transbronchial needle biopsies and transbronchial cryoprobe biopsies were performed to be sent for cytology and pathology.  Needle-in-lesion was confirmed using Cios mobile C-arm.     At the end of the procedure a general airway inspection was performed and there was no evidence of active bleeding. The bronchoscope was removed.  The patient tolerated the procedure well. There was no significant blood loss and there were no obvious complications. A post-procedural chest x-ray is pending.  Samples Target #1: 1. Transbronchial Wang needle biopsies from right upper lobe mass 3. Transbronchial cryoprobe biopsies from right upper lobe mass   Plans:  The patient will be discharged from the PACU to home when recovered from anesthesia and after chest x-ray is reviewed. We will review the cytology, pathology and microbiology results with the patient when they become available. Outpatient followup will be with CANDIE Lites, NP, Dr. Chris and Dr Autumn.    Lamar Chris, MD, PhD 02/14/2024, 2:42 PM [place] [facility] [phone] or if no answer before 7:00PM call  [phone] For any issues after 7:00PM please call eLink [phone]

## 2024-02-14 NOTE — Discharge Instructions (Addendum)
 Flexible Bronchoscopy, Care After This sheet gives you information about how to care for yourself after your test. Your doctor may also give you more specific instructions. If you have problems or questions, contact your doctor. Follow these instructions at home: Eating and drinking When you are wide awake, your numbness is gone and your cough and gag reflexes have come back, you may: Start eating only soft foods. Slowly drink liquids. Six hours after the test, go back to your normal diet. Driving Do not drive for 24 hours if you were given a medicine to help you relax (sedative). Do not drive or use heavy machinery while taking prescription pain medicine. General instructions Take over-the-counter and prescription medicines only as told by your doctor. Return to your normal activities as told. Ask what activities are safe for you. Do not use any products that have nicotine or tobacco in them. This includes cigarettes and e-cigarettes. If you need help quitting, ask your doctor. Keep all follow-up visits as told by your doctor. This is important. It is very important if you had a tissue sample (biopsy) taken. Get help right away if: You have shortness of breath that gets worse. You get light-headed. You feel like you are going to pass out (faint). You have chest pain. You cough up: More than a little blood. More blood than before. Summary Do not use cigarettes. Do not use e-cigarettes. Seek care in the Emergency Department right away if you have chest pain or shortness of breath. Call or MyChart Message our office for any questions or problems at [phone].  Okay to restart Plavix  on 02/15/2024.    This information is not intended to replace advice given to you by your health care provider. Make sure you discuss any questions you have with your health care provider.

## 2024-02-14 NOTE — Interval H&P Note (Signed)
 History and Physical Interval Note:  02/14/2024 11:18 AM  Jacob Dalton  has presented today for surgery, with the diagnosis of lung mass.  The various methods of treatment have been discussed with the patient and family. After consideration of risks, benefits and other options for treatment, the patient has consented to  Procedure(s): VIDEO BRONCHOSCOPY WITH ENDOBRONCHIAL NAVIGATION (Right) as a surgical intervention.  The patient's history has been reviewed, patient examined, no change in status, stable for surgery.  I have reviewed the patient's chart and labs.  Questions were answered to the patient's satisfaction.     Lamar GORMAN Chris

## 2024-02-14 NOTE — Anesthesia Procedure Notes (Signed)
 Procedure Name: Intubation Date/Time: 02/14/2024 1:49 PM  Performed by: Vergie Ike LABOR, CRNAPre-anesthesia Checklist: Patient identified, Emergency Drugs available, Suction available and Patient being monitored Patient Re-evaluated:Patient Re-evaluated prior to induction Oxygen Delivery Method: Circle System Utilized Preoxygenation: Pre-oxygenation with 100% oxygen Induction Type: IV induction Ventilation: Mask ventilation without difficulty Laryngoscope Size: Glidescope and 3 (no view with 3 Miller) Tube type: Oral Number of attempts: 1 Airway Equipment and Method: Stylet Placement Confirmation: ETT inserted through vocal cords under direct vision, positive ETCO2 and breath sounds checked- equal and bilateral Secured at: 22 cm Tube secured with: Tape Dental Injury: Teeth and Oropharynx as per pre-operative assessment

## 2024-02-14 NOTE — Transfer of Care (Signed)
 Immediate Anesthesia Transfer of Care Note  Patient: Jacob Dalton  Procedure(s) Performed: VIDEO BRONCHOSCOPY WITH ENDOBRONCHIAL NAVIGATION (Right)  Patient Location: PACU  Anesthesia Type:General  Level of Consciousness: awake, alert , oriented, and patient cooperative  Airway & Oxygen Therapy: Patient Spontanous Breathing and Patient connected to face mask oxygen  Post-op Assessment: Report given to RN, Post -op Vital signs reviewed and stable, and Patient moving all extremities X 4  Post vital signs: Reviewed and stable  Last Vitals:  Vitals Value Taken Time  BP 154/94 02/14/24 14:46  Temp    Pulse 100 02/14/24 14:47  Resp 11 02/14/24 14:47  SpO2 96 % 02/14/24 14:47  Vitals shown include unfiled device data.  Last Pain: There were no vitals filed for this visit.       Complications: No notable events documented.

## 2024-02-15 ENCOUNTER — Encounter (HOSPITAL_COMMUNITY): Payer: Self-pay | Admitting: Emergency Medicine

## 2024-02-15 NOTE — Anesthesia Postprocedure Evaluation (Signed)
 Anesthesia Post Note  Patient: Jacob Dalton  Procedure(s) Performed: VIDEO BRONCHOSCOPY WITH ENDOBRONCHIAL NAVIGATION (Right)     Patient location during evaluation: PACU Anesthesia Type: General Level of consciousness: awake Pain management: pain level controlled Vital Signs Assessment: post-procedure vital signs reviewed and stable Respiratory status: spontaneous breathing, nonlabored ventilation and respiratory function stable Cardiovascular status: blood pressure returned to baseline and stable Postop Assessment: no apparent nausea or vomiting Anesthetic complications: no   No notable events documented.  Last Vitals:  Vitals:   02/14/24 1500 02/14/24 1515  BP: (!) 156/83 (!) 150/96  Pulse: (!) 104 98  Resp: 14 17  Temp:  36.8 C  SpO2: 96% 95%    Last Pain:  Vitals:   02/14/24 1515  PainSc: 0-No pain                 Algie Westry P Lee Kalt

## 2024-02-16 ENCOUNTER — Other Ambulatory Visit: Payer: Self-pay

## 2024-02-16 ENCOUNTER — Ambulatory Visit: Payer: Self-pay | Admitting: Emergency Medicine

## 2024-02-16 ENCOUNTER — Emergency Department (HOSPITAL_COMMUNITY)

## 2024-02-16 ENCOUNTER — Encounter (HOSPITAL_COMMUNITY): Payer: Self-pay

## 2024-02-16 ENCOUNTER — Emergency Department (HOSPITAL_COMMUNITY)
Admission: EM | Admit: 2024-02-16 | Discharge: 2024-02-16 | Disposition: A | Discharge status: Home / Self Care | Source: Ambulatory Visit | Attending: Emergency Medicine | Admitting: Emergency Medicine

## 2024-02-16 DIAGNOSIS — R079 Chest pain, unspecified: Secondary | ICD-10-CM | POA: Diagnosis not present

## 2024-02-16 DIAGNOSIS — R0789 Other chest pain: Secondary | ICD-10-CM

## 2024-02-16 DIAGNOSIS — Z951 Presence of aortocoronary bypass graft: Secondary | ICD-10-CM | POA: Diagnosis not present

## 2024-02-16 DIAGNOSIS — E119 Type 2 diabetes mellitus without complications: Secondary | ICD-10-CM | POA: Insufficient documentation

## 2024-02-16 DIAGNOSIS — R918 Other nonspecific abnormal finding of lung field: Secondary | ICD-10-CM

## 2024-02-16 DIAGNOSIS — R059 Cough, unspecified: Secondary | ICD-10-CM | POA: Diagnosis not present

## 2024-02-16 DIAGNOSIS — I10 Essential (primary) hypertension: Secondary | ICD-10-CM | POA: Diagnosis not present

## 2024-02-16 DIAGNOSIS — Z7902 Long term (current) use of antithrombotics/antiplatelets: Secondary | ICD-10-CM | POA: Insufficient documentation

## 2024-02-16 DIAGNOSIS — I2510 Atherosclerotic heart disease of native coronary artery without angina pectoris: Secondary | ICD-10-CM | POA: Diagnosis not present

## 2024-02-16 DIAGNOSIS — Z79899 Other long term (current) drug therapy: Secondary | ICD-10-CM | POA: Insufficient documentation

## 2024-02-16 DIAGNOSIS — R091 Pleurisy: Secondary | ICD-10-CM | POA: Diagnosis not present

## 2024-02-16 DIAGNOSIS — J9809 Other diseases of bronchus, not elsewhere classified: Secondary | ICD-10-CM | POA: Diagnosis not present

## 2024-02-16 NOTE — Telephone Encounter (Signed)
 FYI Only or Action Required?: FYI only for provider.  Patient is followed in Pulmonology for LUNG Nodule, last seen on 02/07/2024 by Ruthell Lauraine FALCON, NP.  Called Nurse Triage reporting Cough and Pain to ribs.  Symptoms began today.  Interventions attempted: Nothing.  Symptoms are: unchanged.  Triage Disposition: Go to ED Now (or PCP Triage)  Patient/caregiver understands and will follow disposition?: Yes  Copied from CRM [id number]. Topic: Clinical - Red Word Triage >> Feb 16, 2024  4:42 PM Joesph PARAS wrote: Red Word that prompted transfer to Nurse Triage: Pain in his lower lung/diaphragm when coughing - coughed a lot last night and thinks it may be soreness but directed by Dr. Shelah to report for any pain following bronchoscopy. Reason for Disposition  Patient sounds very sick or weak to the triager  Answer Assessment - Initial Assessment Questions 1. LOCATION: Where does it hurt?       Lower lungs/Diaphragm area 2. RADIATION: Does the pain go anywhere else? (e.g., into neck, jaw, arms, back)     no 3. ONSET: When did the chest pain begin? (Minutes, hours or days)      Started this morning 4. PATTERN: Does the pain come and go, or has it been constant since it started?  Does it get worse with exertion?      Comes and goes 5. DURATION: How long does it last (e.g., seconds, minutes, hours)     Lasts until patient changes positions 6. SEVERITY: How bad is the pain?  (e.g., Scale 1-10; mild, moderate, or severe)     5 out of 10 7. CARDIAC RISK FACTORS: Do you have any history of heart problems or risk factors for heart disease? (e.g., angina, prior heart attack; diabetes, high blood pressure, high cholesterol, smoker, or strong family history of heart disease)     Cardiac stent 8. PULMONARY RISK FACTORS: Do you have any history of lung disease?  (e.g., blood clots in lung, asthma, emphysema, birth control pills)     Lung Nodule 9. CAUSE: What do you think is causing  the chest pain?     unsure 10. OTHER SYMPTOMS: Do you have any other symptoms? (e.g., dizziness, nausea, vomiting, sweating, fever, difficulty breathing, cough)       Coughing  Patient was instructed to call if patient developed pain following bronchoscopy.  Protocols used: Chest Pain-A-AH

## 2024-02-16 NOTE — ED Provider Notes (Signed)
 [place] EMERGENCY DEPARTMENT AT [facility] Provider Note   CSN: [id number] Arrival date & time: 02/16/24  1735     Patient presents with: Pleurisy   Jacob Dalton is a 78 y.o. male.   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  Patient reports he has had this ongoing cough since before the biopsy but last night had a coughing fit and has had soreness in his chest since that time.  They called the nursing line who recommended they come here to get an x-ray just to make sure everything was okay.  Patient did resume his Plavix  and has noticed some pink-tinged sputum but denies any frank blood clots or hemoptysis.  He has not had a fever and reports he does not have significant shortness of breath he just cannot take a deep breath without coughing.  The history is provided by the patient, the spouse and medical records.       Prior to Admission medications   Medication Sig Start Date End Date Taking? Authorizing Provider  acetaminophen  (TYLENOL ) 500 MG tablet 1 tablet as needed Orally every 12 hours    [provider]  buPROPion (WELLBUTRIN XL) 300 MG 24 hr tablet Take 300 mg by mouth daily.    [provider]  calcipotriene-betamethasone  (TACLONEX SCALP) external suspension Apply 1 application topically daily as needed (SCALP PSORASIS).     [provider]  cetirizine (ZYRTEC ALLERGY) 10 MG tablet Take 5 mg by mouth at bedtime.    [provider]  clobetasol (TEMOVATE) 0.05 % external solution Apply topically. 02/28/17   [provider]  clopidogrel  (PLAVIX ) 75 MG tablet TAKE 1 TABLET(75 MG) BY MOUTH DAILY 03/28/23   Anner Alm ORN, MD  Cyanocobalamin  (VITAMIN B12) 1000 MCG TBCR Take 1,000 mcg by mouth daily.     [provider]   fluoruracil (CARAC) 0.5 % cream Apply 1 application topically daily as needed (Burn of pre-cancer cells).     [provider]  fluticasone (FLONASE) 50 MCG/ACT nasal spray Place 1 spray into both nostrils daily as needed for allergies.    [provider]  glucose blood (FREESTYLE LITE) test strip as directed In Vitro once a day dx:E11.9; Duration: 30 days 07/28/20   [provider]  guaiFENesin-dextromethorphan (ROBITUSSIN DM) 100-10 MG/5ML syrup Take 5 mLs by mouth every 4 (four) hours as needed for cough.    [provider]  Multiple Vitamins-Iron  (MULTIVITAMIN/IRON  PO) Take 1 tablet by mouth daily.    [provider]  nitroGLYCERIN  (NITROSTAT ) 0.4 MG SL tablet Place 1 tablet (0.4 mg total) under the tongue every 5 (five) minutes as needed for chest pain. 02/08/24   Wyn Jackee VEAR Mickey., NP  pregabalin (LYRICA) 25 MG capsule Take 25 mg by mouth 2 (two) times daily.    [provider]  PROBIOTIC, LACTOBACILLUS, PO Take by mouth.    [provider]  rosuvastatin (CRESTOR) 5 MG tablet Take 5 mg by mouth at bedtime.     [provider]  simethicone (MYLICON) 80 MG chewable tablet Chew 80-240 mg by mouth every 6 (six) hours as needed for flatulence.     [provider]  sodium fluoride-calcium  carbonate (FLORICAL) 8.3-364 MG CAPS capsule Take 1 capsule by mouth 2 (two) times  daily.    [provider]  tadalafil (CIALIS) 5 MG tablet Take 5 mg by mouth daily as needed for erectile dysfunction. 09/03/21   [provider]  Vibegron (GEMTESA) 75 MG TABS Take 75 mg by mouth daily.    [provider]  Vitamin D-Vitamin K (VITAMIN K2-VITAMIN D3 PO) Take 1 tablet by mouth daily.    [provider]    Allergies: Codeine, Fluvoxamine maleate, Metformin hcl er, Oxybutynin, Simvastatin, Sulfamethoxazole-trimethoprim, Other, Atorvastatin , Gold, and Nsaids    Review of Systems  Updated Vital Signs BP  123/64 (BP Location: Right Arm)   Pulse 80   Temp 98.3 F (36.8 C) (Oral)   Resp 16   SpO2 97%   Physical Exam Vitals and nursing note reviewed.  Constitutional:      General: He is not in acute distress.    Appearance: He is well-developed.     Comments: Persistent coughing on exam  HENT:     Head: Normocephalic and atraumatic.  Eyes:     Conjunctiva/sclera: Conjunctivae normal.     Pupils: Pupils are equal, round, and reactive to light.  Cardiovascular:     Rate and Rhythm: Normal rate and regular rhythm.     Heart sounds: No murmur heard. Pulmonary:     Effort: Pulmonary effort is normal. No respiratory distress.     Breath sounds: Normal breath sounds. No wheezing or rales.  Abdominal:     General: There is no distension.     Palpations: Abdomen is soft.     Tenderness: There is no abdominal tenderness. There is no guarding or rebound.  Musculoskeletal:        General: No tenderness. Normal range of motion.     Cervical back: Normal range of motion and neck supple.  Skin:    General: Skin is warm and dry.     Findings: No erythema or rash.  Neurological:     Mental Status: He is alert and oriented to person, place, and time.  Psychiatric:        Behavior: Behavior normal.     (all labs ordered are listed, but only abnormal results are displayed) Labs Reviewed - No data to display  EKG: None  Radiology: DG Chest 2 View Result Date: 02/16/2024 CLINICAL DATA:  Chest pain coughing bronchoscopy EXAM: CHEST - 2 VIEW COMPARISON:  02/14/2024, CT 01/27/2024 FINDINGS: Right upper lobe opacity corresponding to lung mass. Surgical changes at the GE junction. Stable cardiomediastinal silhouette. Probable small right pleural effusion. Questionable trace right apical pneumothorax. IMPRESSION: 1. Questionable trace right apical pneumothorax. 2. Right upper lobe opacity corresponding to lung mass. Probable small right pleural effusion. Electronically Signed   By: [name] Bun  M.D.   On: 02/16/2024 18:48     Procedures   Medications Ordered in the ED - No data to display                                  Medical Decision Making Amount and/or Complexity of Data Reviewed Radiology: ordered and independent interpretation performed. Decision-making details documented in ED Course.   Pt with multiple medical problems and comorbidities and presenting today with a complaint that caries a high risk for morbidity and mortality.  Here today with the above complaints.  No report of significant hemoptysis.  Concern for possible pneumothorax, rib fracture, normal symptoms after having a biopsy.  He denies any fever or symptoms  suggestive for infectious etiology.  Patient is satting 97% on room air and is coughing persistently which his family member states that it was present even before the biopsy.  Small amount of sputum he had here was just slightly pink. I have independently visualized and interpreted pt's images today.  Chest x-ray with right upper lobe mass.  Radiology reports questionable small apical pneumothorax.  This would be iatrogenic but we will do a CT to evaluate if patient truly has a pneumothorax.   10:57 PM CT without obvious pneumothorax.  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  Radiology reports previously seen masslike density shows worsening adjacent consolidation in the upper lobe when compared with prior studies but no pneumothorax following patient's biopsy and bronchoscopy.  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  Findings were discussed with the patient and his wife.  At this time sats remain normal and he is comfortable going home and continuing the follow-up as planned.      Final diagnoses:  None    ED Discharge Orders     None          Doretha Folks, MD 02/16/24 2257

## 2024-02-16 NOTE — ED Triage Notes (Signed)
 Pt presents to ED from home C/O chest pain while coughing. Reports had bronchoscopy two days ago, and has been coughing a lot, now reporting some pain with coughing. Pt called on call nurse line, who suggested he come in for chest Xray. Denies current chest pain, SOB, fever, chills.

## 2024-02-16 NOTE — Discharge Instructions (Signed)
 No sign of collapsed lung today.  You can continue to use Tylenol  as needed for pain.  Follow-up with your specialist as planned.  If you start coughing up a lot of blood make sure you call the office or return to the emergency room.  Also if you start experiencing significant shortness of breath.

## 2024-02-16 NOTE — ED Triage Notes (Incomplete)
 Bronchoscopy 2 days ago

## 2024-02-17 ENCOUNTER — Ambulatory Visit: Payer: Self-pay

## 2024-02-17 NOTE — Telephone Encounter (Signed)
 Called and spoke with the patient, pt had Bronch 8/26. Day after the procedure pt noticed intense coughing, sore abdomen & lungs. Went to ED, was evaluated with x-ray. X-ray tech told the patient they thought they seen pneumothorax, so they did CT to rule this out. Doctor who seen the pt in the ED advised there was nothing showing and sent the patient home.  Pt has cough medicine as needed & tylenol  for diaphragm pain. Pt still has some minimal coughing going on, but doesn't seem as bad as it was. Little amounts blood (not a full tsp amount) was visible in sputum. Advised this is normal for recovery after having Bronch  Patient would like Dr. Shelah to look at CT results from ED and verify nothing is present. Thanks!

## 2024-02-17 NOTE — Telephone Encounter (Signed)
 FYI Only or Action Required?: Action required by provider: update on patient condition and lab or test result follow-up needed.  Patient is followed in Pulmonology for mass of upper lobe of right lung, last seen on 02/07/2024 by Ruthell Lauraine FALCON, NP.  Called Nurse Triage reporting Advice Only.  Symptoms began information only.  Interventions attempted: Other: information only.  Symptoms are: information only .  Triage Disposition: Call PCP When Office is Open  Patient/caregiver understands and will follow disposition?: Yes                               Copied from CRM [id number]. Topic: Clinical - Lab/Test Results >> Feb 17, 2024  3:48 PM Leila C wrote: Reason for CRM: Dr. Delaine [phone] wants to speak with Dr. Shelah on patient's test results. Informed Dr. Delaine the office is close and will get NT to page Dr. Shelah. Reason for Disposition  [1] Caller requesting NON-URGENT health information AND [2] PCP's office is the best resource  Answer Assessment - Initial Assessment Questions 1. REASON FOR CALL: What is the main reason for your call? or How can I best help you?     Dr. Delaine called to inform Dr. 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 This RN advised provider that I can page on-call to report results. Provider advised that she would call office on Tuesday of next week to relay results directly to Dr. Shelah. This RN advised that I would send a HP message to office in the meantime.  This RN made two attempts to page on-call. No answer and no option to leave a message.  Dr. Susa direct line is [phone].  Protocols used: Information Only Call - No Triage-A-AH

## 2024-02-17 NOTE — Telephone Encounter (Signed)
 Pt is aware of Dr. Shelah note Nfn

## 2024-02-17 NOTE — Telephone Encounter (Signed)
 I reviewed his CT scan of the chest from the ED.  There is no pneumothorax.  He does still have the right upper lobe mass which we biopsied.  I am not surprised that he still having a little bit of blood and cough.  This should continue to tail off.  Agree with using his cough medicine as needed

## 2024-02-20 NOTE — Telephone Encounter (Signed)
 Thank you - pt underwent bronchoscopy last week, cytology is still pending.

## 2024-02-20 NOTE — Telephone Encounter (Signed)
 FYI provider calling about pts imaging

## 2024-02-21 ENCOUNTER — Telehealth: Payer: Self-pay

## 2024-02-21 LAB — CYTOLOGY - NON PAP

## 2024-02-21 NOTE — Telephone Encounter (Signed)
 Called and spoke with Larraine from cytology looks like the doctor is consulting with another doctor before reaching a decision should have them within a day or two will check in AM

## 2024-02-22 ENCOUNTER — Telehealth: Payer: Self-pay | Admitting: Oncology

## 2024-02-22 ENCOUNTER — Encounter: Payer: Self-pay | Admitting: Acute Care

## 2024-02-22 ENCOUNTER — Ambulatory Visit (INDEPENDENT_AMBULATORY_CARE_PROVIDER_SITE_OTHER): Admitting: Acute Care

## 2024-02-22 VITALS — BP 112/60 | HR 85 | Temp 97.7°F | Ht 69.5 in | Wt 148.6 lb

## 2024-02-22 DIAGNOSIS — Z9889 Other specified postprocedural states: Secondary | ICD-10-CM | POA: Diagnosis not present

## 2024-02-22 DIAGNOSIS — Z87891 Personal history of nicotine dependence: Secondary | ICD-10-CM | POA: Diagnosis not present

## 2024-02-22 DIAGNOSIS — C3411 Malignant neoplasm of upper lobe, right bronchus or lung: Secondary | ICD-10-CM

## 2024-02-22 DIAGNOSIS — R911 Solitary pulmonary nodule: Secondary | ICD-10-CM

## 2024-02-22 DIAGNOSIS — M545 Low back pain, unspecified: Secondary | ICD-10-CM | POA: Diagnosis not present
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
## 2024-02-22 NOTE — Progress Notes (Signed)
 Pt's wife, Jacob Dalton, called this NN to check to see if she had called her. This NN had not called the pt. Jacob Dalton asked for the instructions for the PET scan again. Pt's PET scan is at 12 on 9/4, so he should remain NPO after 6AM, however can take daily medication with sips of plain water. Pt does not take any medications for diabetes. Jacob Dalton asked NN if it would be a problem for the pt to take a Pepcid  for his acid reflux the morning of. NN stated it shouldn't be a problem, but stated she would check with Nuc Med and if it needs addressed, this NN will call her back.  NN called Jacob Dalton back to confirm is she was aware that the pt's Head CT had been cancelled. Note on the cancelled appt states a VM was left for the pt to reschedule. Reason for cancelling was a CT scan machine was down. Jacob Dalton states she was not aware of this. As requested, Jacob Dalton was provided with the number for central scheduling to reschedule the pt's Head CT.

## 2024-02-22 NOTE — Telephone Encounter (Signed)
 Rock called in requesting for her husband Iker to be seen sooner than 9/10 to go over scan results. I informed Rock that it takes 7-10 business days for the scans to be read and that Dr. Autumn is at the [facility] campus Wed-Fri. Rock understood and stated that she will keep Carsyn's appointments scheduled as is.

## 2024-02-22 NOTE — Patient Instructions (Signed)
 It is good to see today. I am glad you have done well after the biopsy procedure. We reviewed your biopsy results and they were positive for non-small cell lung cancer, consistent with adenocarcinoma. Your oncologist has already scheduled your PET scan and your CT of the brain. This will complete your staging. Once he has seen the results of the scans he can determine best options for your treatment. Please try Mucinex 1200 mg daily with a full glass of water to see if we can thin your secretions and help your cough. Try using the flutter valve 3 times daily for 4-6 blows each time. We can see if this will help thin secretions so that you can cough them up. Please continue Xyzal as you have been doing for your postnasal drip. Continue Flonase nasal spray as you have been doing. Follow-up with oncology as scheduled. Please call us  for any breathing related issues that we can help with. Good luck with your treatment. The cancer center will take great care of you. Call us  if you need us . Please contact office for sooner follow up if symptoms do not improve or worsen or seek emergency care

## 2024-02-22 NOTE — Progress Notes (Signed)
 Request for pt's tissue be sent to [facility] for molecular studies and PD-L1 emailed to [employer] and Tonya Pendell, [facility] pathology techs.

## 2024-02-23 ENCOUNTER — Ambulatory Visit (HOSPITAL_COMMUNITY): Admission: RE | Admit: 2024-02-23 | Source: Ambulatory Visit

## 2024-02-23 ENCOUNTER — Ambulatory Visit (HOSPITAL_COMMUNITY)

## 2024-02-27 DIAGNOSIS — M545 Low back pain, unspecified: Secondary | ICD-10-CM | POA: Diagnosis not present

## 2024-02-28 ENCOUNTER — Ambulatory Visit (HOSPITAL_COMMUNITY)
Admission: RE | Admit: 2024-02-28 | Discharge: 2024-02-28 | Disposition: A | Discharge status: Home / Self Care | Source: Ambulatory Visit | Attending: Oncology | Admitting: Oncology

## 2024-02-28 DIAGNOSIS — C7951 Secondary malignant neoplasm of bone: Secondary | ICD-10-CM | POA: Diagnosis not present

## 2024-02-28 DIAGNOSIS — R918 Other nonspecific abnormal finding of lung field: Secondary | ICD-10-CM | POA: Diagnosis not present

## 2024-02-28 DIAGNOSIS — I6782 Cerebral ischemia: Secondary | ICD-10-CM | POA: Diagnosis not present

## 2024-02-28 DIAGNOSIS — R519 Headache, unspecified: Secondary | ICD-10-CM | POA: Diagnosis not present

## 2024-02-28 LAB — GLUCOSE, CAPILLARY: Glucose-Capillary: 126 mg/dL — ABNORMAL HIGH (ref 70–99)

## 2024-02-28 MED ORDER — FLUDEOXYGLUCOSE F - 18 (FDG) INJECTION
7.4000 | Freq: Once | INTRAVENOUS | Status: AC
Start: 2024-02-28 — End: 2024-02-28
  Administered 2024-02-28: 7.328 via INTRAVENOUS

## 2024-02-28 MED ORDER — IOHEXOL 300 MG/ML  SOLN
75.0000 mL | Freq: Once | INTRAMUSCULAR | Status: AC | PRN
  Administered 2024-02-28: 75 mL via INTRAVENOUS

## 2024-02-29 ENCOUNTER — Inpatient Hospital Stay: Attending: Oncology | Admitting: Oncology

## 2024-02-29 ENCOUNTER — Encounter: Payer: Self-pay | Admitting: Oncology

## 2024-02-29 ENCOUNTER — Other Ambulatory Visit (HOSPITAL_COMMUNITY): Payer: Self-pay

## 2024-02-29 VITALS — BP 131/81 | HR 82 | Temp 97.9°F | Resp 17 | Ht 69.5 in | Wt 149.0 lb

## 2024-02-29 DIAGNOSIS — C7951 Secondary malignant neoplasm of bone: Secondary | ICD-10-CM | POA: Insufficient documentation

## 2024-02-29 DIAGNOSIS — M47816 Spondylosis without myelopathy or radiculopathy, lumbar region: Secondary | ICD-10-CM | POA: Diagnosis not present

## 2024-02-29 DIAGNOSIS — Z87891 Personal history of nicotine dependence: Secondary | ICD-10-CM | POA: Insufficient documentation

## 2024-02-29 DIAGNOSIS — G4730 Sleep apnea, unspecified: Secondary | ICD-10-CM | POA: Insufficient documentation

## 2024-02-29 DIAGNOSIS — J432 Centrilobular emphysema: Secondary | ICD-10-CM | POA: Insufficient documentation

## 2024-02-29 DIAGNOSIS — I2510 Atherosclerotic heart disease of native coronary artery without angina pectoris: Secondary | ICD-10-CM | POA: Diagnosis not present

## 2024-02-29 DIAGNOSIS — G319 Degenerative disease of nervous system, unspecified: Secondary | ICD-10-CM | POA: Insufficient documentation

## 2024-02-29 DIAGNOSIS — C787 Secondary malignant neoplasm of liver and intrahepatic bile duct: Secondary | ICD-10-CM | POA: Insufficient documentation

## 2024-02-29 DIAGNOSIS — Z7902 Long term (current) use of antithrombotics/antiplatelets: Secondary | ICD-10-CM | POA: Diagnosis not present

## 2024-02-29 DIAGNOSIS — Z66 Do not resuscitate: Secondary | ICD-10-CM | POA: Insufficient documentation

## 2024-02-29 DIAGNOSIS — Z9049 Acquired absence of other specified parts of digestive tract: Secondary | ICD-10-CM | POA: Insufficient documentation

## 2024-02-29 DIAGNOSIS — E1142 Type 2 diabetes mellitus with diabetic polyneuropathy: Secondary | ICD-10-CM | POA: Insufficient documentation

## 2024-02-29 DIAGNOSIS — C3411 Malignant neoplasm of upper lobe, right bronchus or lung: Secondary | ICD-10-CM | POA: Diagnosis not present

## 2024-02-29 DIAGNOSIS — Z825 Family history of asthma and other chronic lower respiratory diseases: Secondary | ICD-10-CM | POA: Insufficient documentation

## 2024-02-29 DIAGNOSIS — M533 Sacrococcygeal disorders, not elsewhere classified: Secondary | ICD-10-CM | POA: Insufficient documentation

## 2024-02-29 DIAGNOSIS — C3491 Malignant neoplasm of unspecified part of right bronchus or lung: Secondary | ICD-10-CM

## 2024-02-29 DIAGNOSIS — J438 Other emphysema: Secondary | ICD-10-CM | POA: Diagnosis not present

## 2024-02-29 DIAGNOSIS — I700 Atherosclerosis of aorta: Secondary | ICD-10-CM | POA: Insufficient documentation

## 2024-02-29 DIAGNOSIS — K5730 Diverticulosis of large intestine without perforation or abscess without bleeding: Secondary | ICD-10-CM | POA: Diagnosis not present

## 2024-02-29 DIAGNOSIS — I10 Essential (primary) hypertension: Secondary | ICD-10-CM | POA: Insufficient documentation

## 2024-02-29 DIAGNOSIS — J910 Malignant pleural effusion: Secondary | ICD-10-CM | POA: Diagnosis not present

## 2024-02-29 DIAGNOSIS — N400 Enlarged prostate without lower urinary tract symptoms: Secondary | ICD-10-CM | POA: Diagnosis not present

## 2024-02-29 DIAGNOSIS — M419 Scoliosis, unspecified: Secondary | ICD-10-CM | POA: Insufficient documentation

## 2024-02-29 DIAGNOSIS — Z885 Allergy status to narcotic agent status: Secondary | ICD-10-CM | POA: Insufficient documentation

## 2024-02-29 DIAGNOSIS — Z903 Acquired absence of stomach [part of]: Secondary | ICD-10-CM | POA: Insufficient documentation

## 2024-02-29 DIAGNOSIS — I6782 Cerebral ischemia: Secondary | ICD-10-CM | POA: Insufficient documentation

## 2024-02-29 DIAGNOSIS — J9819 Other pulmonary collapse: Secondary | ICD-10-CM | POA: Diagnosis not present

## 2024-02-29 DIAGNOSIS — Z8249 Family history of ischemic heart disease and other diseases of the circulatory system: Secondary | ICD-10-CM | POA: Insufficient documentation

## 2024-02-29 DIAGNOSIS — Z79899 Other long term (current) drug therapy: Secondary | ICD-10-CM | POA: Insufficient documentation

## 2024-02-29 DIAGNOSIS — K229 Disease of esophagus, unspecified: Secondary | ICD-10-CM | POA: Diagnosis not present

## 2024-02-29 DIAGNOSIS — Z881 Allergy status to other antibiotic agents status: Secondary | ICD-10-CM | POA: Insufficient documentation

## 2024-02-29 DIAGNOSIS — Z886 Allergy status to analgesic agent status: Secondary | ICD-10-CM | POA: Insufficient documentation

## 2024-02-29 MED ORDER — OSIMERTINIB MESYLATE 80 MG PO TABS
80.0000 mg | ORAL_TABLET | Freq: Every day | ORAL | 5 refills | Status: DC
  Filled 2024-03-01 (×2): qty 30, 30d supply, fill #0
  Filled 2024-03-21: qty 30, 30d supply, fill #1
  Filled 2024-04-19 (×2): qty 30, 30d supply, fill #2

## 2024-02-29 NOTE — Progress Notes (Signed)
  [facility]  ONCOLOGY CLINIC PROGRESS NOTE   Patient Care Team: Charlott Dorn LABOR, MD as PCP - General (Internal Medicine) Anner Alm ORN, MD as PCP - Cardiology (Cardiology) Shelah Lamar RAMAN, MD as Consulting Physician (Pulmonary Disease) Ruthell Lauraine FALCON, NP as Nurse Practitioner (Pulmonary Disease) Prentis Duwaine BROCKS, RN as Oncology Nurse Navigator  PATIENT NAME: Jacob Dalton   MR#: [id number] DOB: [birth year]  Date of visit: 02/29/2024   ASSESSMENT & PLAN:   Jacob Dalton is a 78 y.o. gentleman with a past medical history of CAD status post PCI, hypertension, diabetes, peripheral neuropathy, GERD, past history of smoking (pipe), status post partial gastrectomy from trauma wound, vitamin B12 deficiency, was referred to our clinic for right upper lobe lung mass that was suspicious for lung malignancy, with possible liver and bone mets.  He underwent biopsy of the right upper lobe lung mass on 02/14/2024 which confirmed adenocarcinoma of the lung.  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  Adenocarcinoma of right lung, stage 4 (HCC) CT scan reveals a mass in the right upper lobe with possible lung collapse due to airway obstruction. 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  On 02/14/2024, he underwent navigational bronchoscopy with biopsy of right upper lobe lung mass.  Pathology confirmed non-small cell carcinoma, consistent with adenocarcinoma.  Immunostains positive for TTF-1, Napsin and negative for p40.  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  Low TMB.  Microsatellite stable disease.  On 02/28/2024, staging PET scan showed masslike consolidation and volume loss in the right upper lobe with associated central hypermetabolic activity, consistent with biopsy-proven non-small cell carcinoma.  Mildly hypermetabolic mediastinal and right hilar lymph nodes consistent with nodal  metastasis.  Multiple hypermetabolic sclerotic osseous lesions in the sternum, right scapula, sacral area.  No hypermetabolic activity within the liver.  Moderate right sided pleural effusion without hypermetabolic activity.  Patient cannot do MRI because of metallic hardware. CT head with and without contrast on 02/28/2024 showed no intracranial metastatic disease.  Today I discussed staging, prognosis, plan of care, treatment options.  Reviewed NCCN guidelines.  Given EGFR mutation positivity, plan made to proceed with osimertinib  80 mg orally daily.  Prescription sent on 02/29/2024.  Discussed side effect profile including skin rash, fatigue, nausea, electrolyte disturbances etc.  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  Given stage IV disease, all treatment options are palliative in nature and not curative in intent.  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  We are hoping that his clinical symptoms improve with treatment initiation.  Plan for Xgeva injection on return visit.  I will see him approximately 3 weeks to assess tolerability with osimertinib .    Right-sided malignant pleural effusion Pleural effusion on the right side associated with lung cancer. Expected to improve with targeted therapy. If fluid persists, a thoracentesis may be considered. - Monitor pleural effusion and consider thoracentesis if fluid persists.  Cancer-related symptoms: cough, anorexia, and weight loss Symptoms of cough, anorexia, and weight loss are driven by the cancer. Improvement is expected with targeted therapy within 2-6 weeks. - Monitor symptoms and expect improvement with osimertinib .  I reviewed lab results and outside records for this visit and discussed relevant results with the patient. Diagnosis, plan of care and treatment options were also discussed in detail with the patient. Opportunity provided to ask questions and answers  provided to his apparent satisfaction. Provided instructions to call our clinic with any problems, questions or concerns prior to return visit. I recommended to continue follow-up with PCP and sub-specialists. He verbalized understanding and agreed with the plan.   NCCN guidelines have been consulted in the planning of this patient's care.  I spent a total of 40 minutes during this encounter with the patient including review of chart and various tests results, discussions about plan of care and coordination of care plan.   [name], MD  02/29/2024 6:21 PM  [facility] - A DEPT [facility] 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   Current Treatment: Prescription sent for osimertinib  80 mg daily on 02/29/2024.  INTERVAL HISTORY:    Discussed the use of AI scribe software for clinical note transcription with the patient, who gave verbal consent to proceed.  History of Present Illness Jacob Dalton is an 78 year old male with non-small cell lung cancer adenocarcinoma who presents for follow-up after biopsy results. He is accompanied by Rock, his caregiver.  He was diagnosed with non-small cell lung cancer adenocarcinoma following a biopsy on February 14, 2024, which confirmed the presence of this type of lung cancer. Further testing revealed a positive EGFR mutation, allowing for targeted therapy.  He experiences symptoms including cough, decreased appetite, and weight loss, attributed to the cancer. A recent PET scan showed multiple bone lesions and lymph node involvement in the chest, as well as a pleural effusion on the right side. The main tumor is located in the right upper lobe of the  lung, with some lung collapse in that area. There is no evidence of disease in the brain from a CT scan of the head.  The cancer has spread to the bones, including the sternum, right scapula, and sacrum. He experiences discomfort in these areas but reports no major pain currently. He is not on any chemotherapy or immunotherapy.  He engages in light physical activity, such as walking in the neighborhood and physical therapy for balance. He is not experiencing major pain but has a cough that worsens with deep breathing.    I have reviewed the past medical history, past surgical history, social history and family history with the patient and they are unchanged from previous note.  HISTORY OF PRESENT ILLNESS:   ONCOLOGY HISTORY:   He has a history of back pain that began approximately in January 2025, leading to multiple diagnostic tests including a myelogram and various bone scans, none of which showed significant findings.    Later on he developed cough, shortness of breath and chest x-ray was abnormal which led to CT of the chest without contrast on 01/27/2024 which showed findings concerning for right upper lobe lung malignancy with possible liver metastatic disease and bone metastatic disease.   CT Chest without contrast on 01/27/2024: Large, masslike peribronchial consolidation in the right upper lobe, measuring 4.2 x 7.3 x 7.5 cm, especially worrisome for primary lung malignancy. Interlobular septal thickening and areas of micronodularity throughout the remainder of the right upper lobe is worrisome for lymphangitic spread of disease. Bony sclerosis in the lateral right sternomanubrial joint and scattered throughout the visualized thoracolumbar spine (the largest lesion in  the left lateral T9 vertebral body), worrisome for metastatic disease. Heterogeneous appearance of the liver parenchyma with a couple of small hypodensities (measuring up to 1.3 cm in the left hepatic lobe). A nonemergent CT of  the abdomen and pelvis with IV contrast is recommended to assess for metastatic disease.    CT Abdomen and Pelvis with and without contrast on 02/03/2024 showed hypodense ill-defined 14 mm segment IV b hepatic lesion demonstrating a questionable peripheral nodular focus of enhancement on arterial phase decreasing in size peripherally over subsequent postcontrast sequences. Nonspecific imaging characteristics possibly reflecting a benign hepatic hemangioma but is incompletely characterized on this examination and metastasis while not definitively favored is not excluded. Consider more definitive characterization with hepatic protocol MRI with and without contrast. If the definitive diagnosis of these would NOT adjust immediate therapy options would suggest follow-up in 2-3 months by MRI to allow assessment of the additional variable of change over time to help in the evaluation of these lesions . Ill-defined subtle tiny hypodensities in the right and left lobes of the liver measuring 4 mm and 5 mm respectively are also incompletely characterized. Attention on follow-up MRI imaging suggested. 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 Symmetric distal esophageal wall thickening, which may reflect esophagitis. Enlarged prostate gland. Aortic atherosclerosis.   He had consultation in pulmonology clinic on 02/07/2024 and is scheduled for robotic assisted navigational bronchoscopy with biopsies on 02/14/2024.   His past medical history includes significant surgical history from an injury sustained during the [facility] war, resulting in the loss of part of his liver, 80% of his stomach, gallbladder, appendix, and vagus nerves. He also has a history of a drug-eluting stent placement in his LAD due to a 99% occlusion, for which he takes Plavix . He uses a CPAP machine for sleep apnea, although he has had difficulty with mask fit and compliance  recently. He has low-level diabetes, previously managed with metformin, but currently not on medication. His A1c has been stable between 6 and 7.   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 Engineer, building services, he pursued higher education, completing college and law school. No significant exposures post-military, aside from living in an old house and exposure to dust in his garage.   He lives with his wife , he is a retired Programmer, multimedia and judge. He has adult children. Family history of allergies, asthma, heart disease rheumatism and cancer.He drinks one beer 3 times a week.    He does experience shortness of breath with activity. He has experienced weight loss, noting a decrease of about ten pounds over the last six months, with a recent drop in appetite. No hemoptysis or any significant respiratory symptoms aside from a cough that he attributes to his current situation. The cough is frequent and non productive.   On 02/14/2024, he underwent navigational bronchoscopy with biopsy of right upper lobe lung mass.  Pathology confirmed non-small cell carcinoma, consistent with adenocarcinoma.  Immunostains positive for TTF-1, Napsin and negative for p40.  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  Low TMB.  Microsatellite stable disease.  On 02/28/2024, staging PET scan showed masslike consolidation and volume loss in the right upper lobe with associated central hypermetabolic activity, consistent with biopsy-proven non-small cell carcinoma.  Mildly hypermetabolic mediastinal and right hilar lymph nodes consistent with nodal metastasis.  Multiple hypermetabolic sclerotic osseous lesions in the sternum, right scapula, sacral area.  No hypermetabolic activity within the liver.  Moderate right sided pleural effusion without hypermetabolic activity.  Patient cannot do MRI because  of metallic hardware. CT head with and without contrast on 02/28/2024 showed no intracranial metastatic disease.  Given EGFR mutation positivity, plan made to proceed with osimertinib  80 mg orally daily.  Prescription sent on 02/29/2024.  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  Oncology History  Adenocarcinoma of right lung, stage 4 (HCC)  02/07/2024 Initial Diagnosis   Adenocarcinoma of right lung, stage 4 (HCC)   02/29/2024 Cancer Staging   Staging form: Lung, AJCC V9 - Clinical: Stage IVB (cT4, cN2b, cM1c1) - Signed by Autumn Millman, MD on 02/29/2024 Method of lymph node assessment: Clinical       REVIEW OF SYSTEMS:   Review of Systems  Constitutional:  Positive for appetite change and unexpected weight change.  Respiratory:  Positive for cough and shortness of breath.   Musculoskeletal:  Positive for arthralgias and back pain.    All other pertinent systems were reviewed with the patient and are negative.  ALLERGIES: He is allergic to codeine, fluvoxamine maleate, metformin hcl er, oxybutynin, simvastatin, sulfamethoxazole-trimethoprim, other, atorvastatin , gold, and nsaids.  MEDICATIONS:  Current Outpatient Medications  Medication Sig Dispense Refill   acetaminophen  (TYLENOL ) 500 MG tablet 1 tablet as needed Orally every 12 hours     buPROPion (WELLBUTRIN XL) 300 MG 24 hr tablet Take 300 mg by mouth daily.     calcipotriene-betamethasone  (TACLONEX SCALP) external suspension Apply 1 application topically daily as needed (SCALP PSORASIS).      clobetasol (TEMOVATE) 0.05 % external solution Apply topically.     clopidogrel  (PLAVIX ) 75 MG tablet TAKE 1 TABLET(75 MG) BY MOUTH DAILY 90 tablet 3   Cyanocobalamin  (VITAMIN B12) 1000 MCG TBCR Take 1,000 mcg by mouth daily.      fluoruracil (CARAC) 0.5 % cream Apply 1 application topically daily as needed (Burn of pre-cancer cells).      fluticasone (FLONASE) 50 MCG/ACT nasal  spray Place 1 spray into both nostrils daily as needed for allergies.     glucose blood (FREESTYLE LITE) test strip as directed In Vitro once a day dx:E11.9; Duration: 30 days     guaiFENesin-dextromethorphan (ROBITUSSIN DM) 100-10 MG/5ML syrup Take 5 mLs by mouth every 4 (four) hours as needed for cough.     Multiple Vitamins-Iron  (MULTIVITAMIN/IRON  PO) Take 1 tablet by mouth daily.     nitroGLYCERIN  (NITROSTAT ) 0.4 MG SL tablet Place 1 tablet (0.4 mg total) under the tongue every 5 (five) minutes as needed for chest pain. 25 tablet 3   osimertinib  mesylate (TAGRISSO ) 80 MG tablet Take 1 tablet (80 mg total) by mouth daily. 30 tablet 5   PROBIOTIC, LACTOBACILLUS, PO Take by mouth.     rosuvastatin (CRESTOR) 5 MG tablet Take 5 mg by mouth at bedtime.      simethicone (MYLICON) 80 MG chewable tablet Chew 80-240 mg by mouth every 6 (six) hours as needed for flatulence.      sodium fluoride-calcium  carbonate (FLORICAL) 8.3-364 MG CAPS capsule Take 1 capsule by mouth 2 (two) times daily.     tadalafil (CIALIS) 5 MG tablet Take 5 mg by mouth daily as needed for erectile dysfunction.     Vibegron (GEMTESA) 75 MG TABS Take 75 mg by mouth daily.     Vitamin D-Vitamin K (VITAMIN K2-VITAMIN D3  PO) Take 1 tablet by mouth daily.     No current facility-administered medications for this visit.     VITALS:   Blood pressure 131/81, pulse 82, temperature 97.9 F (36.6 C), temperature source Temporal, resp. rate 17, height 5' 9.5 (1.765 m), weight 149 lb (67.6 kg), SpO2 99%.  Wt Readings from Last 3 Encounters:  02/29/24 149 lb (67.6 kg)  02/22/24 148 lb 9.6 oz (67.4 kg)  02/14/24 154 lb (69.9 kg)    Body mass index is 21.69 kg/m.   Onc Performance Status - 02/29/24 1507       ECOG Perf Status   ECOG Perf Status Restricted in physically strenuous activity but ambulatory and able to carry out work of a light or sedentary nature, e.g., light house work, office work      KPS SCALE   KPS % SCORE  Cares for self, unable to carry on normal activity or to do active work          PHYSICAL EXAM:   Physical Exam Constitutional:      General: He is not in acute distress.    Appearance: Normal appearance.  HENT:     Head: Normocephalic and atraumatic.  Eyes:     Conjunctiva/sclera: Conjunctivae normal.  Cardiovascular:     Rate and Rhythm: Normal rate and regular rhythm.  Pulmonary:     Effort: Pulmonary effort is normal. No respiratory distress.     Comments: Diminished breath sounds at the right base.  Clear to auscultation elsewhere. Abdominal:     General: There is no distension.  Neurological:     General: No focal deficit present.     Mental Status: He is alert and oriented to person, place, and time.  Psychiatric:        Mood and Affect: Mood normal.        Behavior: Behavior normal.       LABORATORY DATA:   I have reviewed the data as listed.  No results found for any visits on 02/29/24.    RADIOGRAPHIC STUDIES:  I have personally reviewed the radiological images as listed and agree with the findings in the report.  CT HEAD W & WO CONTRAST ( ) Result Date: 02/28/2024 CLINICAL DATA:  Initial evaluation for possible intracranial metastatic disease. Intermittent headaches. EXAM: CT HEAD WITHOUT AND WITH CONTRAST TECHNIQUE: Contiguous axial images were obtained from the base of the skull through the vertex without and with intravenous contrast. RADIATION DOSE REDUCTION: This exam was performed according to the departmental dose-optimization program which includes automated exposure control, adjustment of the mA and/or kV according to patient size and/or use of iterative reconstruction technique. CONTRAST:  75mL OMNIPAQUE  IOHEXOL  300 MG/ML  SOLN COMPARISON:  PET-CT from earlier the same day as well as prior head CT from 03/05/2020. FINDINGS: Brain: Mild age-related cerebral atrophy. Patchy hypodensity involving the periventricular deep white matter both cerebral  hemispheres, consistent with chronic small vessel ischemic disease, mild-to-moderate in nature. No acute intracranial hemorrhage. No acute large vessel territory infarct. No visible mass lesion or evidence for intracranial metastatic disease. No abnormal enhancement. Diffuse ventricular prominence related global parenchymal volume loss without hydrocephalus. No extra-axial fluid collection. Vascular: No abnormal hyperdense vessel seen prior to contrast administration. Following contrast administration, normal enhancement seen throughout the major intracranial vascular structures. Scattered vascular calcifications noted within the carotid siphons. Skull: Scalp soft tissues demonstrate no acute finding. Calvarium intact. No visible focal osseous lesions. Sinuses/Orbits: Globes and orbital soft tissues within normal limits. Mild  scattered mucosal thickening present about the ethmoidal air cells. Paranasal sinuses are otherwise clear. No mastoid effusion. Other: None. IMPRESSION: 1. No acute intracranial abnormality. No evidence for intracranial metastatic disease. 2. Mild age-related cerebral atrophy with mild-to-moderate chronic small vessel ischemic disease. Electronically Signed   By: Morene Hoard M.D.   On: 02/28/2024 20:33   NM PET Image Initial (PI) Skull Base To Thigh Result Date: 02/28/2024 CLINICAL DATA:  Initial treatment strategy for suspected lung cancer with hepatic and osseous metastatic disease. EXAM: NUCLEAR MEDICINE PET SKULL BASE TO THIGH TECHNIQUE: 7.33 mCi F-18 FDG was injected intravenously. Full-ring PET imaging was performed from the skull base to thigh after the radiotracer. CT data was obtained and used for attenuation correction and anatomic localization. Fasting blood glucose: 126 mg/dl COMPARISON:  Chest CT 91/71/7974 and 01/27/2024. Abdominopelvic CT 02/03/2024. FINDINGS: Mediastinal blood pool activity: SUV max 1.8 NECK: No hypermetabolic cervical lymph nodes are identified. No  suspicious activity identified within the pharyngeal mucosal space. Incidental CT findings: Intracranial atrophy noted. CHEST: Persistent mass-like consolidation and volume loss in the right upper lobe with associated central hypermetabolic activity (SUV max 6.1). Persistent associated right upper lobe bronchial narrowing. No hypermetabolic activity or suspicious nodularity elsewhere within the lungs. There are several small mildly hypermetabolic mediastinal and right hilar lymph nodes, including a subcarinal node with an SUV max of 4.0 and a right hilar node with an SUV max of 3.6. No hypermetabolic axillary adenopathy. Incidental CT findings: Enlarging moderate-sized right pleural effusion without associated hypermetabolic activity. Mild centrilobular and paraseptal emphysema. Coronary atherosclerosis noted. ABDOMEN/PELVIS: No hypermetabolic activity identified within the liver to correspond with the previously demonstrated lesions. There is no hypermetabolic activity within the spleen, pancreas or adrenal glands. There is no hypermetabolic nodal activity in the abdomen or pelvis. Bowel activity within physiologic limits. Incidental CT findings: Surgical clips at the gastroesophageal junction. Aortic and branch vessel atherosclerosis. The prostate gland is mildly enlarged. SKELETON: Again demonstrated are multiple sclerotic osseous lesions with associated hypermetabolic activity, consistent with metastatic disease. Representative lesions include a lesion in the upper left sternum with an SUV max of 4.5, a T9 lesion with an SUV max of 6.0, right scapular lesion (SUV max 4.8) and a central sacral lesion (SUV max 5.2). No definite pathologic fracture identified. Incidental CT findings: Multilevel spondylosis associated with a convex left lumbar scoliosis. IMPRESSION: 1. Persistent mass-like consolidation and volume loss in the right upper lobe with associated central hypermetabolic activity, consistent with biopsy  proven non-small cell carcinoma. 2. Mildly hypermetabolic mediastinal and right hilar lymph nodes, suspicious for nodal metastases. 3. Multiple hypermetabolic sclerotic osseous lesions consistent with metastatic disease. 4. No hypermetabolic activity identified within the liver to correspond with the previously demonstrated lesions. 5. Enlarging moderate-sized right pleural effusion without associated hypermetabolic activity. 6. Aortic Atherosclerosis (ICD10-I70.0) and Emphysema (ICD10-J43.9). Electronically Signed   By: Jacob Perone M.D.   On: 02/28/2024 17:15   CT Chest Wo Contrast Result Date: 02/16/2024 CLINICAL DATA:  Possible pneumothorax on recent chest x-ray following bronchoscopy 2 days ago, initial encounter EXAM: CT CHEST WITHOUT CONTRAST TECHNIQUE: Multidetector CT imaging of the chest was performed following the standard protocol without IV contrast. RADIATION DOSE REDUCTION: This exam was performed according to the departmental dose-optimization program which includes automated exposure control, adjustment of the mA and/or kV according to patient size and/or use of iterative reconstruction technique. COMPARISON:  01/27/2024, chest x-ray from earlier in the same day. FINDINGS: Cardiovascular: Limited due to lack of IV contrast.  No aneurysmal dilatation is noted. Heart is not significantly enlarged. Coronary calcifications are noted. Mediastinum/Nodes: Thoracic inlet is within normal limits. No hilar or mediastinal adenopathy is noted. The esophagus as visualized is within normal limits. Lungs/Pleura: Left lung is well aerated. Persistent consolidation in the right upper lobe is noted increased when compared with the prior CT examination from 01/27/2024 only mild aeration of inferior aspect of the right upper lobe is noted. Narrowing of the right upper lobe bronchus is noted contributing to the degree of consolidation aside from the mass seen previously. Right middle lobe and right lower lobe  appear within normal limits. Previously seen nodularity is not well appreciated on today's exam. Small right pleural effusion is noted. No pneumothorax is seen. Upper Abdomen: Visualized upper abdomen shows postsurgical changes about the gastroesophageal junction. No other focal abnormality is seen. Previously seen hepatic changes not well appreciated on today's exam. Musculoskeletal: Scattered stable areas of sclerosis are noted within the thoracic spine worst at T9 on the left. Similar findings are noted in the sternum at the level of the sternomanubrial joint. IMPRESSION: Previously seen masslike density shows worsening adjacent consolidation in the upper lobe when compared with the prior study. No pneumothorax is noted following bronchoscopy. Sclerotic foci within the thoracic spine and sternomanubrial joint suspicious for metastatic disease. Electronically Signed   By: Oneil Devonshire M.D.   On: 02/16/2024 22:49   DG Chest 2 View Result Date: 02/16/2024 CLINICAL DATA:  Chest pain coughing bronchoscopy EXAM: CHEST - 2 VIEW COMPARISON:  02/14/2024, CT 01/27/2024 FINDINGS: Right upper lobe opacity corresponding to lung mass. Surgical changes at the GE junction. Stable cardiomediastinal silhouette. Probable small right pleural effusion. Questionable trace right apical pneumothorax. IMPRESSION: 1. Questionable trace right apical pneumothorax. 2. Right upper lobe opacity corresponding to lung mass. Probable small right pleural effusion. Electronically Signed   By: [name] Bun M.D.   On: 02/16/2024 18:48   DG Chest Port 1 View Result Date: 02/14/2024 CLINICAL DATA:  Status post bronchoscopy with biopsy. EXAM: PORTABLE CHEST 1 VIEW COMPARISON:  February 02, 2021.  January 27, 2024. FINDINGS: The heart size and mediastinal contours are within normal limits. Left lung is clear. Large right upper lobe opacity is noted concerning for malignancy and associated atelectasis. No pneumothorax is noted status post bronchoscopy.  The visualized skeletal structures are unremarkable. IMPRESSION: Large right upper lobe opacity is noted concerning for malignancy and associated atelectasis. No pneumothorax is noted status post bronchoscopy. Electronically Signed   By: Lynwood Landy Raddle M.D.   On: 02/14/2024 16:01   DG C-ARM BRONCHOSCOPY Result Date: 02/14/2024 C-ARM BRONCHOSCOPY: Fluoroscopy was utilized by the requesting physician.  No radiographic interpretation.   CT ABDOMEN PELVIS W WO CONTRAST Result Date: 02/06/2024 CLINICAL DATA:  Hepatic lesion, further evaluation. CT findings concerning for malignancy on chest CT January 27, 2024. * Tracking Code: BO * EXAM: CT ABDOMEN AND PELVIS WITHOUT AND WITH CONTRAST TECHNIQUE: Multidetector CT imaging of the abdomen and pelvis was performed following the standard protocol before and following the bolus administration of intravenous contrast. RADIATION DOSE REDUCTION: This exam was performed according to the departmental dose-optimization program which includes automated exposure control, adjustment of the mA and/or kV according to patient size and/or use of iterative reconstruction technique. CONTRAST:  OMNIPAQUE  IOHEXOL  300 MG/ML  SOLN COMPARISON:  Chest CT January 27, 2024 CT abdomen pelvis December 27, 2008 FINDINGS: Lower chest: Symmetric distal esophageal wall thickening. 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 Ill-defined subtle tiny hypodensity in the right lobe of the liver measuring 4 mm on image 27/12. And another in the left lobe of the liver measuring 5 mm on image 17/12. Gallbladder surgically absent. Mild prominence of the biliary tree is within normal limits for reservoir effect post cholecystectomy and similar to prior. Pancreas: Atrophy in the body and tail of the  pancreas with prominence of the pancreatic duct is similar dating back to December 27, 2008 Spleen: No splenomegaly or focal splenic lesion. Adrenals/Urinary Tract: No suspicious adrenal nodule/mass. Kidneys demonstrate symmetric enhancement and excretion of contrast material. No solid enhancing renal mass. Urinary bladder is unremarkable for degree of distension. Stomach/Bowel: Postsurgical changes along the gastroesophageal junction. Stomach is minimally distended. No pathologic dilation of small or large bowel. Scattered colonic diverticulosis. Vascular/Lymphatic: Normal caliber abdominal aorta. Aortic atherosclerosis. Smooth IVC contours. The portal, splenic and superior mesenteric veins are patent. No pathologically enlarged abdominal or pelvic lymph nodes. Reproductive: Enlarged prostate gland. Other: No significant abdominopelvic free fluid. Musculoskeletal: Multifocal sclerotic osseous lesions involving the spine and bony pelvis instance in the S1 vertebral body on image 86/15 and L5 vertebral body on image 52/14. IMPRESSION: 1. Hypodense ill-defined 14 mm segment IVb hepatic lesion demonstrates a questionable peripheral nodular focus of enhancement on arterial phase decreasing in size peripherally over subsequent postcontrast sequences. Nonspecific imaging characteristics possibly reflecting a benign hepatic hemangioma but is incompletely characterized on this examination and metastasis while not definitively favored is not excluded. Consider more definitive characterization with hepatic protocol MRI with and without contrast. If the definitive diagnosis of these would NOT adjust immediate therapy options would suggest follow-up in 2-3 months by MRI to allow assessment of the additional variable of change over time to help in the evaluation of these lesions . 2. Ill-defined subtle tiny hypodensities in the right and left lobes of the liver measuring 4 mm and 5 mm respectively are also incompletely characterized.  Attention on follow-up MRI imaging suggested. 3. 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 4. Symmetric distal esophageal wall thickening, which may reflect esophagitis. 5. Enlarged prostate gland. 6. Aortic atherosclerosis. Aortic Atherosclerosis (ICD10-I70.0). Electronically Signed   By: Reyes Holder M.D.   On: 02/06/2024 18:40     CODE STATUS:  Advance Directive Documentation    Flowsheet Row Most Recent Value  Type of Advance Directive Out of facility DNR (pink MOST or yellow form)  Pre-existing out of facility DNR order (yellow form or pink MOST form) --  MOST Form in Place? --    Orders Placed This Encounter  Procedures   CBC with Differential (Cancer Center Only)    Standing Status:   Future    Expiration Date:   02/28/2025   CMP (Cancer Center only)    Standing Status:   Future    Expiration Date:   02/28/2025   Magnesium    Standing Status:   Future    Expiration Date:   02/28/2025   Lactate dehydrogenase    Standing Status:   Future    Expiration Date:   02/28/2025   Phosphorus    Standing Status:   Future    Expiration Date:   02/28/2025     Future Appointments  Date Time Provider Department Center  03/21/2024 11:00 AM [facility] None  03/21/2024 11:30 AM  Autumn Millman, MD [facility] None  03/21/2024 11:45 AM [facility] FLUSH [facility] None      This document was completed utilizing speech recognition software. Grammatical errors, random word insertions, pronoun errors, and incomplete sentences are an occasional consequence of this system due to software limitations, ambient noise, and hardware issues. Any formal questions or concerns about the content, text or information contained within the body of this dictation should be directly addressed to the provider for clarification.

## 2024-02-29 NOTE — Assessment & Plan Note (Addendum)
 CT scan reveals a mass in the right upper lobe with possible lung collapse due to airway obstruction. 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  On 02/14/2024, he underwent navigational bronchoscopy with biopsy of right upper lobe lung mass.  Pathology confirmed non-small cell carcinoma, consistent with adenocarcinoma.  Immunostains positive for TTF-1, Napsin and negative for p40.  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  Low TMB.  Microsatellite stable disease.  On 02/28/2024, staging PET scan showed masslike consolidation and volume loss in the right upper lobe with associated central hypermetabolic activity, consistent with biopsy-proven non-small cell carcinoma.  Mildly hypermetabolic mediastinal and right hilar lymph nodes consistent with nodal metastasis.  Multiple hypermetabolic sclerotic osseous lesions in the sternum, right scapula, sacral area.  No hypermetabolic activity within the liver.  Moderate right sided pleural effusion without hypermetabolic activity.  Patient cannot do MRI because of metallic hardware. CT head with and without contrast on 02/28/2024 showed no intracranial metastatic disease.  Today I discussed staging, prognosis, plan of care, treatment options.  Reviewed NCCN guidelines.  Given EGFR mutation positivity, plan made to proceed with osimertinib  80 mg orally daily.  Prescription sent on 02/29/2024.  Discussed side effect profile including skin rash, fatigue, nausea, electrolyte disturbances etc.  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  Given stage IV disease, all treatment options are palliative in nature and not curative in intent.  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  We are hoping that his clinical symptoms improve with treatment initiation.  Plan for Xgeva injection on return visit.  I will  see him approximately 3 weeks to assess tolerability with osimertinib .

## 2024-03-01 ENCOUNTER — Telehealth: Payer: Self-pay

## 2024-03-01 ENCOUNTER — Other Ambulatory Visit: Payer: Self-pay

## 2024-03-01 ENCOUNTER — Other Ambulatory Visit (HOSPITAL_COMMUNITY): Payer: Self-pay

## 2024-03-01 NOTE — Progress Notes (Signed)
 Patient counseled on Tagrisso  in telephone encounter opened on 03/01/24.  Yuvonne Lanahan, PharmD Hematology/Oncology Clinical Pharmacist [facility] [phone]

## 2024-03-01 NOTE — Progress Notes (Signed)
 Specialty Pharmacy Initial Fill Coordination Note  Jacob Dalton is a 78 y.o. male contacted today regarding refills of specialty medication(s) Osimertinib  Mesylate (TAGRISSO ) .  Patient requested [name] at [facility] at [place]  on 03/02/24   Medication will be filled on 03/02/24.   Patient is aware of $24.00 copayment.    He is going out of town and will need to pick up tomorrow.  Lucie Lamer, CPhT [place]  [facility] Services Pharmacy Technician [facility] [place] Phone: [phone]  Fax: [phone] [email or web address]

## 2024-03-01 NOTE — Telephone Encounter (Signed)
 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  Labs from 02/09/2024 (CBC and CMP) assessed, no interventions needed. Patient had an EKG completed on 02/08/24. Prescription dose and frequency assessed for appropriateness.   Current medication list in Epic reviewed, DDIs with Tagrisso  identified: - BCRP/ABCG2 Inhibitors may increase serum concentrations of Rosuvastatin. Patient is on lowest dose of Crestor. No changes needed.   Evaluated chart and no patient barriers to medication adherence noted.   Prescription has been e-scribed to the [facility] for benefits analysis and approval.  Oral Oncology Clinic will continue to follow for insurance authorization, copayment issues, initial counseling and start date.  Perline Awe, PharmD Hematology/Oncology Clinical Pharmacist [facility] [phone] 03/01/2024 9:05 AM

## 2024-03-02 NOTE — Telephone Encounter (Signed)
 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   Treatment goal: Palliative  Counseled patient on administration, dosing, side effects, monitoring, drug-food interactions, safe handling, storage, and disposal.  Patient will take Tagrisso  80 tablets, 1 tablet by mouth once daily, without regard to food.  Tagrisso  start date: 03/02/2024  Adverse effects include but are not limited to: diarrhea, mouth sores, fatigue, dry skin, rash, nail changes, altered cardiac conduction, and decreased blood counts or electrolytes.  Patient will obtain anti diarrheal and alert the office of 4 or more loose stools above baseline.   Reviewed with patient importance of keeping a medication schedule and plan for any missed doses. No barriers to medication adherence identified.  Medication reconciliation performed and medication/allergy list updated.  Communication and Learning Assessment Primary learner: patient and patients wife Barriers to learning: No barriers Preferred language: English Learning preferences: Listening Reading  All questions answered. Patient and patients wife voiced understanding and appreciation.   Medication education handout placed in mail for patient. Patient knows to call the office with questions or concerns. Oral Chemotherapy Clinic phone number provided to patient.   Efton Thomley, PharmD Hematology/Oncology Clinical Pharmacist [facility] [phone]

## 2024-03-13 ENCOUNTER — Telehealth: Payer: Self-pay | Admitting: Oncology

## 2024-03-13 DIAGNOSIS — M545 Low back pain, unspecified: Secondary | ICD-10-CM | POA: Diagnosis not present

## 2024-03-14 ENCOUNTER — Other Ambulatory Visit: Payer: Self-pay | Admitting: Oncology

## 2024-03-14 ENCOUNTER — Other Ambulatory Visit: Payer: Self-pay

## 2024-03-14 MED ORDER — ALBUTEROL SULFATE HFA 108 (90 BASE) MCG/ACT IN AERS: 2.0000 | INHALATION_SPRAY | Freq: Four times a day (QID) | RESPIRATORY_TRACT | 2 refills | Status: AC | PRN

## 2024-03-21 ENCOUNTER — Inpatient Hospital Stay: Admitting: Oncology

## 2024-03-21 ENCOUNTER — Other Ambulatory Visit: Payer: Self-pay

## 2024-03-21 ENCOUNTER — Inpatient Hospital Stay: Attending: Oncology

## 2024-03-21 ENCOUNTER — Inpatient Hospital Stay

## 2024-03-21 VITALS — BP 127/74 | HR 81 | Temp 98.0°F | Resp 17 | Ht 69.5 in | Wt 148.0 lb

## 2024-03-21 DIAGNOSIS — I2510 Atherosclerotic heart disease of native coronary artery without angina pectoris: Secondary | ICD-10-CM | POA: Diagnosis not present

## 2024-03-21 DIAGNOSIS — C3411 Malignant neoplasm of upper lobe, right bronchus or lung: Secondary | ICD-10-CM | POA: Diagnosis not present

## 2024-03-21 DIAGNOSIS — C7951 Secondary malignant neoplasm of bone: Secondary | ICD-10-CM | POA: Diagnosis not present

## 2024-03-21 DIAGNOSIS — J910 Malignant pleural effusion: Secondary | ICD-10-CM | POA: Diagnosis not present

## 2024-03-21 DIAGNOSIS — Z885 Allergy status to narcotic agent status: Secondary | ICD-10-CM | POA: Insufficient documentation

## 2024-03-21 DIAGNOSIS — J432 Centrilobular emphysema: Secondary | ICD-10-CM | POA: Insufficient documentation

## 2024-03-21 DIAGNOSIS — Z825 Family history of asthma and other chronic lower respiratory diseases: Secondary | ICD-10-CM | POA: Insufficient documentation

## 2024-03-21 DIAGNOSIS — I6782 Cerebral ischemia: Secondary | ICD-10-CM | POA: Diagnosis not present

## 2024-03-21 DIAGNOSIS — R197 Diarrhea, unspecified: Secondary | ICD-10-CM | POA: Diagnosis not present

## 2024-03-21 DIAGNOSIS — Z886 Allergy status to analgesic agent status: Secondary | ICD-10-CM | POA: Insufficient documentation

## 2024-03-21 DIAGNOSIS — E1142 Type 2 diabetes mellitus with diabetic polyneuropathy: Secondary | ICD-10-CM | POA: Diagnosis not present

## 2024-03-21 DIAGNOSIS — M419 Scoliosis, unspecified: Secondary | ICD-10-CM | POA: Insufficient documentation

## 2024-03-21 DIAGNOSIS — Z87828 Personal history of other (healed) physical injury and trauma: Secondary | ICD-10-CM | POA: Insufficient documentation

## 2024-03-21 DIAGNOSIS — Z66 Do not resuscitate: Secondary | ICD-10-CM | POA: Insufficient documentation

## 2024-03-21 DIAGNOSIS — D649 Anemia, unspecified: Secondary | ICD-10-CM | POA: Diagnosis not present

## 2024-03-21 DIAGNOSIS — C3491 Malignant neoplasm of unspecified part of right bronchus or lung: Secondary | ICD-10-CM

## 2024-03-21 DIAGNOSIS — Z79899 Other long term (current) drug therapy: Secondary | ICD-10-CM | POA: Insufficient documentation

## 2024-03-21 DIAGNOSIS — Z8249 Family history of ischemic heart disease and other diseases of the circulatory system: Secondary | ICD-10-CM | POA: Insufficient documentation

## 2024-03-21 DIAGNOSIS — K909 Intestinal malabsorption, unspecified: Secondary | ICD-10-CM | POA: Diagnosis not present

## 2024-03-21 DIAGNOSIS — I10 Essential (primary) hypertension: Secondary | ICD-10-CM | POA: Insufficient documentation

## 2024-03-21 DIAGNOSIS — D696 Thrombocytopenia, unspecified: Secondary | ICD-10-CM | POA: Insufficient documentation

## 2024-03-21 DIAGNOSIS — G4730 Sleep apnea, unspecified: Secondary | ICD-10-CM | POA: Insufficient documentation

## 2024-03-21 DIAGNOSIS — M533 Sacrococcygeal disorders, not elsewhere classified: Secondary | ICD-10-CM | POA: Insufficient documentation

## 2024-03-21 DIAGNOSIS — J438 Other emphysema: Secondary | ICD-10-CM | POA: Diagnosis not present

## 2024-03-21 DIAGNOSIS — N400 Enlarged prostate without lower urinary tract symptoms: Secondary | ICD-10-CM | POA: Diagnosis not present

## 2024-03-21 DIAGNOSIS — C787 Secondary malignant neoplasm of liver and intrahepatic bile duct: Secondary | ICD-10-CM | POA: Insufficient documentation

## 2024-03-21 DIAGNOSIS — R110 Nausea: Secondary | ICD-10-CM | POA: Insufficient documentation

## 2024-03-21 DIAGNOSIS — K229 Disease of esophagus, unspecified: Secondary | ICD-10-CM | POA: Insufficient documentation

## 2024-03-21 DIAGNOSIS — Z87891 Personal history of nicotine dependence: Secondary | ICD-10-CM | POA: Insufficient documentation

## 2024-03-21 DIAGNOSIS — M47816 Spondylosis without myelopathy or radiculopathy, lumbar region: Secondary | ICD-10-CM | POA: Insufficient documentation

## 2024-03-21 DIAGNOSIS — I700 Atherosclerosis of aorta: Secondary | ICD-10-CM | POA: Insufficient documentation

## 2024-03-21 DIAGNOSIS — G319 Degenerative disease of nervous system, unspecified: Secondary | ICD-10-CM | POA: Insufficient documentation

## 2024-03-21 DIAGNOSIS — M25519 Pain in unspecified shoulder: Secondary | ICD-10-CM | POA: Diagnosis not present

## 2024-03-21 DIAGNOSIS — Z903 Acquired absence of stomach [part of]: Secondary | ICD-10-CM | POA: Insufficient documentation

## 2024-03-21 DIAGNOSIS — Z882 Allergy status to sulfonamides status: Secondary | ICD-10-CM | POA: Insufficient documentation

## 2024-03-21 DIAGNOSIS — Z809 Family history of malignant neoplasm, unspecified: Secondary | ICD-10-CM | POA: Insufficient documentation

## 2024-03-21 DIAGNOSIS — Z7902 Long term (current) use of antithrombotics/antiplatelets: Secondary | ICD-10-CM | POA: Insufficient documentation

## 2024-03-21 LAB — LACTATE DEHYDROGENASE: LDH: 99 U/L (ref 98–192)

## 2024-03-21 LAB — CMP (CANCER CENTER ONLY)
ALT: 63 U/L — ABNORMAL HIGH (ref 0–44)
AST: 61 U/L — ABNORMAL HIGH (ref 15–41)
Albumin: 4 g/dL (ref 3.5–5.0)
Alkaline Phosphatase: 160 U/L — ABNORMAL HIGH (ref 38–126)
Anion gap: 6 (ref 5–15)
BUN: 19 mg/dL (ref 8–23)
CO2: 28 mmol/L (ref 22–32)
Calcium: 8.8 mg/dL — ABNORMAL LOW (ref 8.9–10.3)
Chloride: 105 mmol/L (ref 98–111)
Creatinine: 1.02 mg/dL (ref 0.61–1.24)
GFR, Estimated: >60 mL/min
Glucose, Bld: 149 mg/dL — ABNORMAL HIGH (ref 70–99)
Potassium: 3.8 mmol/L (ref 3.5–5.1)
Sodium: 139 mmol/L (ref 135–145)
Total Bilirubin: 0.4 mg/dL (ref 0.0–1.2)
Total Protein: 6.6 g/dL (ref 6.5–8.1)

## 2024-03-21 LAB — CBC WITH DIFFERENTIAL (CANCER CENTER ONLY)
Abs Immature Granulocytes: 0.02 K/uL (ref 0.00–0.07)
Basophils Absolute: 0 K/uL (ref 0.0–0.1)
Basophils Relative: 1 %
Eosinophils Absolute: 0.1 K/uL (ref 0.0–0.5)
Eosinophils Relative: 2 %
HCT: 40.4 % (ref 39.0–52.0)
Hemoglobin: 13.6 g/dL (ref 13.0–17.0)
Immature Granulocytes: 0 %
Lymphocytes Relative: 11 %
Lymphs Abs: 0.6 K/uL — ABNORMAL LOW (ref 0.7–4.0)
MCH: 28.1 pg (ref 26.0–34.0)
MCHC: 33.7 g/dL (ref 30.0–36.0)
MCV: 83.5 fL (ref 80.0–100.0)
Monocytes Absolute: 0.5 K/uL (ref 0.1–1.0)
Monocytes Relative: 9 %
Neutro Abs: 4.1 K/uL (ref 1.7–7.7)
Neutrophils Relative %: 77 %
Platelet Count: 148 K/uL — ABNORMAL LOW (ref 150–400)
RBC: 4.84 MIL/uL (ref 4.22–5.81)
RDW: 13.3 % (ref 11.5–15.5)
WBC Count: 5.3 K/uL (ref 4.0–10.5)
nRBC: 0 % (ref 0.0–0.2)

## 2024-03-21 LAB — PHOSPHORUS: Phosphorus: 3.8 mg/dL (ref 2.5–4.6)

## 2024-03-21 LAB — MAGNESIUM: Magnesium: 2.2 mg/dL (ref 1.7–2.4)

## 2024-03-21 MED ORDER — DENOSUMAB 120 MG/1.7ML ~~LOC~~ SOLN
120.0000 mg | Freq: Once | SUBCUTANEOUS | Status: AC
  Administered 2024-03-21: 120 mg via SUBCUTANEOUS
  Filled 2024-03-21: qty 1.7

## 2024-03-21 MED ORDER — ONDANSETRON 8 MG PO TBDP: 8.0000 mg | ORAL_TABLET | Freq: Three times a day (TID) | ORAL | 3 refills | Status: AC | PRN

## 2024-03-21 NOTE — Assessment & Plan Note (Signed)
 CT scan reveals a mass in the right upper lobe with possible lung collapse due to airway obstruction. 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  On 02/14/2024, he underwent navigational bronchoscopy with biopsy of right upper lobe lung mass.  Pathology confirmed non-small cell carcinoma, consistent with adenocarcinoma.  Immunostains positive for TTF-1, Napsin and negative for p40.  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  Low TMB.  Microsatellite stable disease.  On 02/28/2024, staging PET scan showed masslike consolidation and volume loss in the right upper lobe with associated central hypermetabolic activity, consistent with biopsy-proven non-small cell carcinoma.  Mildly hypermetabolic mediastinal and right hilar lymph nodes consistent with nodal metastasis.  Multiple hypermetabolic sclerotic osseous lesions in the sternum, right scapula, sacral area.  No hypermetabolic activity within the liver.  Moderate right sided pleural effusion without hypermetabolic activity.  Patient cannot do MRI because of metallic hardware. CT head with and without contrast on 02/28/2024 showed no intracranial metastatic disease.  Today I discussed staging, prognosis, plan of care, treatment options.  Reviewed NCCN guidelines.  Given EGFR mutation positivity, plan made to proceed with osimertinib  80 mg orally daily.  Prescription sent on 02/29/2024.  Discussed side effect profile including skin rash, fatigue, nausea, electrolyte disturbances etc.  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  Given stage IV disease, all treatment options are palliative in nature and not curative in intent.  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  We are hoping that his clinical symptoms improve with treatment initiation.  Plan for Xgeva injection on return visit.  I will  see him approximately 3 weeks to assess tolerability with osimertinib .

## 2024-03-21 NOTE — Progress Notes (Signed)
 [facility]  ONCOLOGY CLINIC PROGRESS NOTE   Patient Care Team: Charlott Dorn LABOR, MD as PCP - General (Internal Medicine) Anner Alm ORN, MD as PCP - Cardiology (Cardiology) Shelah Lamar RAMAN, MD as Consulting Physician (Pulmonary Disease) Ruthell Lauraine FALCON, NP as Nurse Practitioner (Pulmonary Disease) Prentis Duwaine BROCKS, RN as Oncology Nurse Navigator  PATIENT NAME: Jacob Dalton   MR#: [id number] DOB: May 24, 1946  Date of visit: 03/21/2024   ASSESSMENT & PLAN:   Jacob Dalton is a 78 y.o. gentleman with a past medical history of CAD status post PCI, hypertension, diabetes, peripheral neuropathy, GERD, past history of smoking (pipe), status post partial gastrectomy from trauma wound, vitamin B12 deficiency, was referred to our clinic for right upper lobe lung mass that was suspicious for lung malignancy, with possible liver and bone mets.  He underwent biopsy of the right upper lobe lung mass on 02/14/2024 which confirmed adenocarcinoma of the lung.  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  Adenocarcinoma of right lung, stage 4 (HCC) CT scan reveals a mass in the right upper lobe with possible lung collapse due to airway obstruction. 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  On 02/14/2024, he underwent navigational bronchoscopy with biopsy of right upper lobe lung mass.  Pathology confirmed non-small cell carcinoma, consistent with adenocarcinoma.  Immunostains positive for TTF-1, Napsin and negative for p40.  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  Low TMB.  Microsatellite stable disease.  On 02/28/2024, staging PET scan showed masslike consolidation and volume loss in the right upper lobe with associated central hypermetabolic activity, consistent with biopsy-proven non-small cell carcinoma.  Mildly hypermetabolic mediastinal and right hilar lymph nodes consistent with nodal  metastasis.  Multiple hypermetabolic sclerotic osseous lesions in the sternum, right scapula, sacral area.  No hypermetabolic activity within the liver.  Moderate right sided pleural effusion without hypermetabolic activity.  Patient cannot do MRI because of metallic hardware. CT head with and without contrast on 02/28/2024 showed no intracranial metastatic disease.  Today I discussed staging, prognosis, plan of care, treatment options.  Reviewed NCCN guidelines.  Given EGFR mutation positivity, plan made to proceed with osimertinib  80 mg orally daily.  Prescription sent on 02/29/2024.  Discussed side effect profile including skin rash, fatigue, nausea, electrolyte disturbances etc.  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  Given stage IV disease, all treatment options are palliative in nature and not curative in intent.  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  We are hoping that his clinical symptoms improve with treatment initiation.  Plan for Xgeva injection on return visit.  I will see him approximately 3 weeks to assess tolerability with osimertinib .    Right-sided malignant pleural effusion Pleural effusion on the right side associated with lung cancer. Expected to improve with targeted therapy. If fluid persists, a thoracentesis may be considered. - Monitor pleural effusion and consider thoracentesis if fluid persists.  Cancer-related symptoms: cough, anorexia, and weight loss Symptoms of cough, anorexia, and weight loss are driven by the cancer. Improvement is expected with targeted therapy within 2-6 weeks. - Monitor symptoms and expect improvement with osimertinib .  I reviewed lab results and outside records for this visit and discussed relevant results with the patient. Diagnosis, plan of care and treatment options were also discussed in detail with the patient. Opportunity provided to ask questions and answers  provided to his apparent satisfaction. Provided instructions to call our clinic with any problems, questions or concerns prior to return visit. I recommended to continue follow-up with PCP and sub-specialists. He verbalized understanding and agreed with the plan.   NCCN guidelines have been consulted in the planning of this patient's care.  I spent a total of 40 minutes during this encounter with the patient including review of chart and various tests results, discussions about plan of care and coordination of care plan.   [name], MD  03/21/2024 11:27 AM  [facility] - [facility] DE[facility] 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   Current Treatment: Prescription sent for osimertinib  80 mg daily on 02/29/2024.  INTERVAL HISTORY:    Discussed the use of AI scribe software for clinical note transcription with the patient, who gave verbal consent to proceed.  History of Present Illness Jacob Dalton is an 78 year old male with non-small cell lung cancer adenocarcinoma who presents for follow-up after biopsy results. He is accompanied by Rock, his caregiver.  He was diagnosed with non-small cell lung cancer adenocarcinoma following a biopsy on February 14, 2024, which confirmed the presence of this type of lung cancer. Further testing revealed a positive EGFR mutation, allowing for targeted therapy.  He experiences symptoms including cough, decreased appetite, and weight loss, attributed to the cancer. A recent PET scan showed multiple bone lesions and lymph node involvement in the chest, as well as a pleural effusion on the right side. The main tumor is located in the right upper lobe of the  lung, with some lung collapse in that area. There is no evidence of disease in the brain from a CT scan of the head.  The cancer has spread to the bones, including the sternum, right scapula, and sacrum. He experiences discomfort in these areas but reports no major pain currently. He is not on any chemotherapy or immunotherapy.  He engages in light physical activity, such as walking in the neighborhood and physical therapy for balance. He is not experiencing major pain but has a cough that worsens with deep breathing.    I have reviewed the past medical history, past surgical history, social history and family history with the patient and they are unchanged from previous note.  HISTORY OF PRESENT ILLNESS:   ONCOLOGY HISTORY:   He has a history of back pain that began approximately in January 2025, leading to multiple diagnostic tests including a myelogram and various bone scans, none of which showed significant findings.    Later on he developed cough, shortness of breath and chest x-ray was abnormal which led to CT of the chest without contrast on 01/27/2024 which showed findings concerning for right upper lobe lung malignancy with possible liver metastatic disease and bone metastatic disease.   CT Chest without contrast on 01/27/2024: Large, masslike peribronchial consolidation in the right upper lobe, measuring 4.2 x 7.3 x 7.5 cm, especially worrisome for primary lung malignancy. Interlobular septal thickening and areas of micronodularity throughout the remainder of the right upper lobe is worrisome for lymphangitic spread of disease. Bony sclerosis in the lateral right sternomanubrial joint and scattered throughout the visualized thoracolumbar spine (the largest lesion in  the left lateral T9 vertebral body), worrisome for metastatic disease. Heterogeneous appearance of the liver parenchyma with a couple of small hypodensities (measuring up to 1.3 cm in the left hepatic lobe). A nonemergent CT of  the abdomen and pelvis with IV contrast is recommended to assess for metastatic disease.    CT Abdomen and Pelvis with and without contrast on 02/03/2024 showed hypodense ill-defined 14 mm segment IV b hepatic lesion demonstrating a questionable peripheral nodular focus of enhancement on arterial phase decreasing in size peripherally over subsequent postcontrast sequences. Nonspecific imaging characteristics possibly reflecting a benign hepatic hemangioma but is incompletely characterized on this examination and metastasis while not definitively favored is not excluded. Consider more definitive characterization with hepatic protocol MRI with and without contrast. If the definitive diagnosis of these would NOT adjust immediate therapy options would suggest follow-up in 2-3 months by MRI to allow assessment of the additional variable of change over time to help in the evaluation of these lesions . Ill-defined subtle tiny hypodensities in the right and left lobes of the liver measuring 4 mm and 5 mm respectively are also incompletely characterized. Attention on follow-up MRI imaging suggested. 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 Symmetric distal esophageal wall thickening, which may reflect esophagitis. Enlarged prostate gland. Aortic atherosclerosis.   He had consultation in pulmonology clinic on 02/07/2024 and is scheduled for robotic assisted navigational bronchoscopy with biopsies on 02/14/2024.   His past medical history includes significant surgical history from an injury sustained during the [facility] war, resulting in the loss of part of his liver, 80% of his stomach, gallbladder, appendix, and vagus nerves. He also has a history of a drug-eluting stent placement in his LAD due to a 99% occlusion, for which he takes Plavix . He uses a CPAP machine for sleep apnea, although he has had difficulty with mask fit and compliance  recently. He has low-level diabetes, previously managed with metformin, but currently not on medication. His A1c has been stable between 6 and 7.   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 Engineer, building services, he pursued higher education, completing college and law school. No significant exposures post-military, aside from living in an old house and exposure to dust in his garage.   He lives with his wife , he is a retired Programmer, multimedia and judge. He has adult children. Family history of allergies, asthma, heart disease rheumatism and cancer.He drinks one beer 3 times a week.    He does experience shortness of breath with activity. He has experienced weight loss, noting a decrease of about ten pounds over the last six months, with a recent drop in appetite. No hemoptysis or any significant respiratory symptoms aside from a cough that he attributes to his current situation. The cough is frequent and non productive.   On 02/14/2024, he underwent navigational bronchoscopy with biopsy of right upper lobe lung mass.  Pathology confirmed non-small cell carcinoma, consistent with adenocarcinoma.  Immunostains positive for TTF-1, Napsin and negative for p40.  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  Low TMB.  Microsatellite stable disease.  On 02/28/2024, staging PET scan showed masslike consolidation and volume loss in the right upper lobe with associated central hypermetabolic activity, consistent with biopsy-proven non-small cell carcinoma.  Mildly hypermetabolic mediastinal and right hilar lymph nodes consistent with nodal metastasis.  Multiple hypermetabolic sclerotic osseous lesions in the sternum, right scapula, sacral area.  No hypermetabolic activity within the liver.  Moderate right sided pleural effusion without hypermetabolic activity.  Patient cannot do MRI because  of metallic hardware. CT head with and without contrast on 02/28/2024 showed no intracranial metastatic disease.  Given EGFR mutation positivity, plan made to proceed with osimertinib  80 mg orally daily.  Prescription sent on 02/29/2024.  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  Oncology History  Adenocarcinoma of right lung, stage 4 (HCC)  02/07/2024 Initial Diagnosis   Adenocarcinoma of right lung, stage 4 (HCC)   02/29/2024 Cancer Staging   Staging form: Lung, AJCC V9 - Clinical: Stage IVB (cT4, cN2b, cM1c1) - Signed by Autumn Millman, MD on 02/29/2024 Method of lymph node assessment: Clinical       REVIEW OF SYSTEMS:   Review of Systems  Constitutional:  Positive for appetite change and unexpected weight change.  Respiratory:  Positive for cough and shortness of breath.   Musculoskeletal:  Positive for arthralgias and back pain.    All other pertinent systems were reviewed with the patient and are negative.  ALLERGIES: He is allergic to codeine, fluvoxamine maleate, metformin hcl er, oxybutynin, simvastatin, sulfamethoxazole-trimethoprim, other, atorvastatin , gold, and nsaids.  MEDICATIONS:  Current Outpatient Medications  Medication Sig Dispense Refill   acetaminophen  (TYLENOL ) 500 MG tablet 1 tablet as needed Orally every 12 hours     albuterol  (VENTOLIN  HFA) 108 (90 Base) MCG/ACT inhaler Inhale 2 puffs into the lungs every 6 (six) hours as needed for wheezing or shortness of breath. 8 g 2   buPROPion (WELLBUTRIN XL) 300 MG 24 hr tablet Take 300 mg by mouth daily.     calcipotriene-betamethasone  (TACLONEX SCALP) external suspension Apply 1 application topically daily as needed (SCALP PSORASIS).      clobetasol (TEMOVATE) 0.05 % external solution Apply topically.     clopidogrel  (PLAVIX ) 75 MG tablet TAKE 1 TABLET(75 MG) BY MOUTH DAILY 90 tablet 3   Cyanocobalamin  (VITAMIN B12) 1000 MCG TBCR Take 1,000 mcg by mouth  daily.      fluoruracil (CARAC) 0.5 % cream Apply 1 application topically daily as needed (Burn of pre-cancer cells).      fluticasone (FLONASE) 50 MCG/ACT nasal spray Place 1 spray into both nostrils daily as needed for allergies.     glucose blood (FREESTYLE LITE) test strip as directed In Vitro once a day dx:E11.9; Duration: 30 days     guaiFENesin-dextromethorphan (ROBITUSSIN DM) 100-10 MG/5ML syrup Take 5 mLs by mouth every 4 (four) hours as needed for cough.     Multiple Vitamins-Iron  (MULTIVITAMIN/IRON  PO) Take 1 tablet by mouth daily.     nitroGLYCERIN  (NITROSTAT ) 0.4 MG SL tablet Place 1 tablet (0.4 mg total) under the tongue every 5 (five) minutes as needed for chest pain. 25 tablet 3   osimertinib  mesylate (TAGRISSO ) 80 MG tablet Take 1 tablet (80 mg total) by mouth daily. 30 tablet 5   PROBIOTIC, LACTOBACILLUS, PO Take by mouth.     rosuvastatin (CRESTOR) 5 MG tablet Take 5 mg by mouth at bedtime.      simethicone (MYLICON) 80 MG chewable tablet Chew 80-240 mg by mouth every 6 (six) hours as needed for flatulence.      sodium fluoride-calcium  carbonate (FLORICAL) 8.3-364 MG CAPS capsule Take 1 capsule by mouth 2 (two) times daily.     tadalafil (CIALIS) 5 MG tablet Take 5 mg by mouth  daily as needed for erectile dysfunction.     Vibegron (GEMTESA) 75 MG TABS Take 75 mg by mouth daily.     Vitamin D-Vitamin K (VITAMIN K2-VITAMIN D3 PO) Take 1 tablet by mouth daily.     No current facility-administered medications for this visit.     VITALS:   There were no vitals taken for this visit.  Wt Readings from Last 3 Encounters:  02/29/24 149 lb (67.6 kg)  02/22/24 148 lb 9.6 oz (67.4 kg)  02/14/24 154 lb (69.9 kg)    There is no height or weight on file to calculate BMI.     PHYSICAL EXAM:   Physical Exam Constitutional:      General: He is not in acute distress.    Appearance: Normal appearance.  HENT:     Head: Normocephalic and atraumatic.  Eyes:      Conjunctiva/sclera: Conjunctivae normal.  Cardiovascular:     Rate and Rhythm: Normal rate and regular rhythm.  Pulmonary:     Effort: Pulmonary effort is normal. No respiratory distress.     Comments: Diminished breath sounds at the right base.  Clear to auscultation elsewhere. Abdominal:     General: There is no distension.  Neurological:     General: No focal deficit present.     Mental Status: He is alert and oriented to person, place, and time.  Psychiatric:        Mood and Affect: Mood normal.        Behavior: Behavior normal.       LABORATORY DATA:   I have reviewed the data as listed.  No results found for any visits on 03/21/24.    RADIOGRAPHIC STUDIES:  I have personally reviewed the radiological images as listed and agree with the findings in the report.  CT HEAD W & WO CONTRAST ( ) Result Date: 02/28/2024 CLINICAL DATA:  Initial evaluation for possible intracranial metastatic disease. Intermittent headaches. EXAM: CT HEAD WITHOUT AND WITH CONTRAST TECHNIQUE: Contiguous axial images were obtained from the base of the skull through the vertex without and with intravenous contrast. RADIATION DOSE REDUCTION: This exam was performed according to the departmental dose-optimization program which includes automated exposure control, adjustment of the mA and/or kV according to patient size and/or use of iterative reconstruction technique. CONTRAST:  75mL OMNIPAQUE  IOHEXOL  300 MG/ML  SOLN COMPARISON:  PET-CT from earlier the same day as well as prior head CT from 03/05/2020. FINDINGS: Brain: Mild age-related cerebral atrophy. Patchy hypodensity involving the periventricular deep white matter both cerebral hemispheres, consistent with chronic small vessel ischemic disease, mild-to-moderate in nature. No acute intracranial hemorrhage. No acute large vessel territory infarct. No visible mass lesion or evidence for intracranial metastatic disease. No abnormal enhancement. Diffuse  ventricular prominence related global parenchymal volume loss without hydrocephalus. No extra-axial fluid collection. Vascular: No abnormal hyperdense vessel seen prior to contrast administration. Following contrast administration, normal enhancement seen throughout the major intracranial vascular structures. Scattered vascular calcifications noted within the carotid siphons. Skull: Scalp soft tissues demonstrate no acute finding. Calvarium intact. No visible focal osseous lesions. Sinuses/Orbits: Globes and orbital soft tissues within normal limits. Mild scattered mucosal thickening present about the ethmoidal air cells. Paranasal sinuses are otherwise clear. No mastoid effusion. Other: None. IMPRESSION: 1. No acute intracranial abnormality. No evidence for intracranial metastatic disease. 2. Mild age-related cerebral atrophy with mild-to-moderate chronic small vessel ischemic disease. Electronically Signed   By: Morene Hoard M.D.   On: 02/28/2024 20:33   NM PET Image  Initial (PI) Skull Base To Thigh Result Date: 02/28/2024 CLINICAL DATA:  Initial treatment strategy for suspected lung cancer with hepatic and osseous metastatic disease. EXAM: NUCLEAR MEDICINE PET SKULL BASE TO THIGH TECHNIQUE: 7.33 mCi F-18 FDG was injected intravenously. Full-ring PET imaging was performed from the skull base to thigh after the radiotracer. CT data was obtained and used for attenuation correction and anatomic localization. Fasting blood glucose: 126 mg/dl COMPARISON:  Chest CT 91/71/7974 and 01/27/2024. Abdominopelvic CT 02/03/2024. FINDINGS: Mediastinal blood pool activity: SUV max 1.8 NECK: No hypermetabolic cervical lymph nodes are identified. No suspicious activity identified within the pharyngeal mucosal space. Incidental CT findings: Intracranial atrophy noted. CHEST: Persistent mass-like consolidation and volume loss in the right upper lobe with associated central hypermetabolic activity (SUV max 6.1). Persistent  associated right upper lobe bronchial narrowing. No hypermetabolic activity or suspicious nodularity elsewhere within the lungs. There are several small mildly hypermetabolic mediastinal and right hilar lymph nodes, including a subcarinal node with an SUV max of 4.0 and a right hilar node with an SUV max of 3.6. No hypermetabolic axillary adenopathy. Incidental CT findings: Enlarging moderate-sized right pleural effusion without associated hypermetabolic activity. Mild centrilobular and paraseptal emphysema. Coronary atherosclerosis noted. ABDOMEN/PELVIS: No hypermetabolic activity identified within the liver to correspond with the previously demonstrated lesions. There is no hypermetabolic activity within the spleen, pancreas or adrenal glands. There is no hypermetabolic nodal activity in the abdomen or pelvis. Bowel activity within physiologic limits. Incidental CT findings: Surgical clips at the gastroesophageal junction. Aortic and branch vessel atherosclerosis. The prostate gland is mildly enlarged. SKELETON: Again demonstrated are multiple sclerotic osseous lesions with associated hypermetabolic activity, consistent with metastatic disease. Representative lesions include a lesion in the upper left sternum with an SUV max of 4.5, a T9 lesion with an SUV max of 6.0, right scapular lesion (SUV max 4.8) and a central sacral lesion (SUV max 5.2). No definite pathologic fracture identified. Incidental CT findings: Multilevel spondylosis associated with a convex left lumbar scoliosis. IMPRESSION: 1. Persistent mass-like consolidation and volume loss in the right upper lobe with associated central hypermetabolic activity, consistent with biopsy proven non-small cell carcinoma. 2. Mildly hypermetabolic mediastinal and right hilar lymph nodes, suspicious for nodal metastases. 3. Multiple hypermetabolic sclerotic osseous lesions consistent with metastatic disease. 4. No hypermetabolic activity identified within the liver  to correspond with the previously demonstrated lesions. 5. Enlarging moderate-sized right pleural effusion without associated hypermetabolic activity. 6. Aortic Atherosclerosis (ICD10-I70.0) and Emphysema (ICD10-J43.9). Electronically Signed   By: Elsie Perone M.D.   On: 02/28/2024 17:15     CODE STATUS:  Advance Directive Documentation    Flowsheet Row Most Recent Value  Type of Advance Directive Out of facility DNR (pink MOST or yellow form)  Pre-existing out of facility DNR order (yellow form or pink MOST form) --  MOST Form in Place? --    No orders of the defined types were placed in this encounter.    Future Appointments  Date Time Provider Department Center  03/21/2024 11:30 AM Jonatha Gagen, Chinita, MD [facility] None  03/21/2024 11:45 AM [facility] None      This document was completed utilizing speech recognition software. Grammatical errors, random word insertions, pronoun errors, and incomplete sentences are an occasional consequence of this system due to software limitations, ambient noise, and hardware issues. Any formal questions or concerns about the content, text or information contained within the body of this dictation should be directly addressed to the provider for clarification.

## 2024-03-21 NOTE — Progress Notes (Signed)
 Specialty Pharmacy Refill Coordination Note  Jacob Dalton is a 78 y.o. male contacted today regarding refills of specialty medication(s) Osimertinib  Mesylate (TAGRISSO )   Patient requested [name] at [facility] [place] date: 03/28/24   Medication will be filled on 03/27/24.

## 2024-03-21 NOTE — Progress Notes (Signed)
 Okay to proceed today without formal dental clearance per Dr. Autumn. RN to reach out to patient's dentist office to ask for a formal clearance for future purposes.   Harlene Nasuti, PharmD Oncology Infusion Pharmacist 03/21/2024 1:27 PM

## 2024-03-21 NOTE — Patient Instructions (Signed)
 Denosumab  Injection (Oncology) What is this medication? DENOSUMAB  (den oh SUE mab) prevents weakened bones caused by cancer. It may also be used to treat noncancerous bone tumors that cannot be removed by surgery. It can also be used to treat high calcium  levels in the blood caused by cancer. It works by blocking a protein that causes bones to break down quickly. This slows down the release of calcium  from bones, which lowers calcium  levels in your blood. It also makes your bones stronger and less likely to break (fracture). This medicine may be used for other purposes; ask your health care provider or pharmacist if you have questions. COMMON BRAND NAME(S): XGEVA  What should I tell my care team before I take this medication? They need to know if you have any of these conditions: Dental disease Having surgery or tooth extraction Infection Kidney disease Low levels of calcium  or vitamin D  in the blood Malnutrition On hemodialysis Skin conditions or sensitivity Thyroid  or parathyroid disease An unusual reaction to denosumab , other medications, foods, dyes, or preservatives Pregnant or trying to get pregnant Breast-feeding How should I use this medication? This medication is for injection under the skin. It is given by your care team in a hospital or clinic setting. A special MedGuide will be given to you before each treatment. Be sure to read this information carefully each time. Talk to your care team about the use of this medication in children. While it may be prescribed for children as young as 13 years for selected conditions, precautions do apply. Overdosage: If you think you have taken too much of this medicine contact a poison control center or emergency room at once. NOTE: This medicine is only for you. Do not share this medicine with others. What if I miss a dose? Keep appointments for follow-up doses. It is important not to miss your dose. Call your care team if you are unable to  keep an appointment. What may interact with this medication? Do not take this medication with any of the following: Other medications containing denosumab  This medication may also interact with the following: Medications that lower your chance of fighting infection Steroid medications, such as prednisone  or cortisone This list may not describe all possible interactions. Give your health care provider a list of all the medicines, herbs, non-prescription drugs, or dietary supplements you use. Also tell them if you smoke, drink alcohol, or use illegal drugs. Some items may interact with your medicine. What should I watch for while using this medication? Your condition will be monitored carefully while you are receiving this medication. You may need blood work while taking this medication. This medication may increase your risk of getting an infection. Call your care team for advice if you get a fever, chills, sore throat, or other symptoms of a cold or flu. Do not treat yourself. Try to avoid being around people who are sick. You should make sure you get enough calcium  and vitamin D  while you are taking this medication, unless your care team tells you not to. Discuss the foods you eat and the vitamins you take with your care team. Some people who take this medication have severe bone, joint, or muscle pain. This medication may also increase your risk for jaw problems or a broken thigh bone. Tell your care team right away if you have severe pain in your jaw, bones, joints, or muscles. Tell your care team if you have any pain that does not go away or that gets worse. Talk  to your care team if you may be pregnant. Serious birth defects can occur if you take this medication during pregnancy and for 5 months after the last dose. You will need a negative pregnancy test before starting this medication. Contraception is recommended while taking this medication and for 5 months after the last dose. Your care team  can help you find the option that works for you. What side effects may I notice from receiving this medication? Side effects that you should report to your care team as soon as possible: Allergic reactions--skin rash, itching, hives, swelling of the face, lips, tongue, or throat Bone, joint, or muscle pain Low calcium  level--muscle pain or cramps, confusion, tingling, or numbness in the hands or feet Osteonecrosis of the jaw--pain, swelling, or redness in the mouth, numbness of the jaw, poor healing after dental work, unusual discharge from the mouth, visible bones in the mouth Side effects that usually do not require medical attention (report to your care team if they continue or are bothersome): Cough Diarrhea Fatigue Headache Nausea This list may not describe all possible side effects. Call your doctor for medical advice about side effects. You may report side effects to FDA at 1-800-FDA-1088. Where should I keep my medication? This medication is given in a hospital or clinic. It will not be stored at home. NOTE: This sheet is a summary. It may not cover all possible information. If you have questions about this medicine, talk to your doctor, pharmacist, or health care provider.  2024 [employer]/Gold Standard (2021-10-28 00:00:00)

## 2024-03-21 NOTE — Progress Notes (Signed)
 Specialty Pharmacy Ongoing Clinical Assessment Note  Jacob Dalton is a 78 y.o. male who is being followed by the specialty pharmacy service for RxSp Oncology   Patient's specialty medication(s) reviewed today: Osimertinib  Mesylate (TAGRISSO )   Missed doses in the last 4 weeks: 0   Patient/Caregiver did not have any additional questions or concerns.   Therapeutic benefit summary: Unable to assess   Adverse events/side effects summary: Experienced adverse events/side effects (prescribed zofran  for nausea, patient starting calcium  and one other otc that wife could not recall both of which may counteract diarrhea or she is aware patient may start immodium in future if needed)   Patient's therapy is appropriate to: Continue    Goals Addressed             This Visit's Progress    Maintain optimal adherence to therapy       Patient is on track. Patient will maintain adherence          Follow up: 3 months  Anuoluwapo Mefferd M Claudine Stallings Specialty Pharmacist

## 2024-03-22 ENCOUNTER — Encounter: Payer: Self-pay | Admitting: Oncology

## 2024-03-22 ENCOUNTER — Telehealth: Payer: Self-pay | Admitting: Oncology

## 2024-03-22 NOTE — Telephone Encounter (Signed)
 Scheduled appointments per 10/1 los. Talked with the patients wife and she is aware of the made appointments for the patient.

## 2024-03-27 ENCOUNTER — Other Ambulatory Visit: Payer: Self-pay

## 2024-03-27 ENCOUNTER — Other Ambulatory Visit: Payer: Self-pay | Admitting: Cardiology

## 2024-03-27 DIAGNOSIS — M545 Low back pain, unspecified: Secondary | ICD-10-CM | POA: Diagnosis not present

## 2024-03-29 DIAGNOSIS — M545 Low back pain, unspecified: Secondary | ICD-10-CM | POA: Diagnosis not present

## 2024-03-30 ENCOUNTER — Other Ambulatory Visit (HOSPITAL_COMMUNITY): Payer: Self-pay

## 2024-04-05 ENCOUNTER — Encounter: Payer: Self-pay | Admitting: Oncology

## 2024-04-06 DIAGNOSIS — G629 Polyneuropathy, unspecified: Secondary | ICD-10-CM | POA: Diagnosis not present

## 2024-04-06 DIAGNOSIS — M48061 Spinal stenosis, lumbar region without neurogenic claudication: Secondary | ICD-10-CM | POA: Diagnosis not present

## 2024-04-06 DIAGNOSIS — G4733 Obstructive sleep apnea (adult) (pediatric): Secondary | ICD-10-CM | POA: Diagnosis not present

## 2024-04-06 DIAGNOSIS — E782 Mixed hyperlipidemia: Secondary | ICD-10-CM | POA: Diagnosis not present

## 2024-04-06 DIAGNOSIS — K219 Gastro-esophageal reflux disease without esophagitis: Secondary | ICD-10-CM | POA: Diagnosis not present

## 2024-04-06 DIAGNOSIS — E559 Vitamin D deficiency, unspecified: Secondary | ICD-10-CM | POA: Diagnosis not present

## 2024-04-06 DIAGNOSIS — Z0000 Encounter for general adult medical examination without abnormal findings: Secondary | ICD-10-CM | POA: Diagnosis not present

## 2024-04-06 DIAGNOSIS — C3491 Malignant neoplasm of unspecified part of right bronchus or lung: Secondary | ICD-10-CM | POA: Diagnosis not present

## 2024-04-06 DIAGNOSIS — R269 Unspecified abnormalities of gait and mobility: Secondary | ICD-10-CM | POA: Diagnosis not present

## 2024-04-06 DIAGNOSIS — E119 Type 2 diabetes mellitus without complications: Secondary | ICD-10-CM | POA: Diagnosis not present

## 2024-04-06 DIAGNOSIS — F324 Major depressive disorder, single episode, in partial remission: Secondary | ICD-10-CM | POA: Diagnosis not present

## 2024-04-06 DIAGNOSIS — F429 Obsessive-compulsive disorder, unspecified: Secondary | ICD-10-CM | POA: Diagnosis not present

## 2024-04-06 DIAGNOSIS — C7951 Secondary malignant neoplasm of bone: Secondary | ICD-10-CM | POA: Diagnosis not present

## 2024-04-06 DIAGNOSIS — Z1331 Encounter for screening for depression: Secondary | ICD-10-CM | POA: Diagnosis not present

## 2024-04-09 DIAGNOSIS — M545 Low back pain, unspecified: Secondary | ICD-10-CM | POA: Diagnosis not present

## 2024-04-12 DIAGNOSIS — M545 Low back pain, unspecified: Secondary | ICD-10-CM | POA: Diagnosis not present

## 2024-04-13 DIAGNOSIS — M4183 Other forms of scoliosis, cervicothoracic region: Secondary | ICD-10-CM | POA: Diagnosis not present

## 2024-04-13 DIAGNOSIS — M4185 Other forms of scoliosis, thoracolumbar region: Secondary | ICD-10-CM | POA: Diagnosis not present

## 2024-04-13 DIAGNOSIS — M4187 Other forms of scoliosis, lumbosacral region: Secondary | ICD-10-CM | POA: Diagnosis not present

## 2024-04-13 DIAGNOSIS — M419 Scoliosis, unspecified: Secondary | ICD-10-CM | POA: Diagnosis not present

## 2024-04-16 DIAGNOSIS — M545 Low back pain, unspecified: Secondary | ICD-10-CM | POA: Diagnosis not present

## 2024-04-19 ENCOUNTER — Other Ambulatory Visit: Payer: Self-pay

## 2024-04-19 ENCOUNTER — Inpatient Hospital Stay

## 2024-04-19 ENCOUNTER — Encounter: Payer: Self-pay | Admitting: Oncology

## 2024-04-19 ENCOUNTER — Inpatient Hospital Stay (HOSPITAL_BASED_OUTPATIENT_CLINIC_OR_DEPARTMENT_OTHER): Admitting: Oncology

## 2024-04-19 ENCOUNTER — Other Ambulatory Visit (HOSPITAL_COMMUNITY): Payer: Self-pay

## 2024-04-19 VITALS — BP 138/68 | HR 75 | Temp 98.6°F | Resp 16 | Ht 69.5 in | Wt 151.6 lb

## 2024-04-19 DIAGNOSIS — D696 Thrombocytopenia, unspecified: Secondary | ICD-10-CM | POA: Diagnosis not present

## 2024-04-19 DIAGNOSIS — C3491 Malignant neoplasm of unspecified part of right bronchus or lung: Secondary | ICD-10-CM

## 2024-04-19 DIAGNOSIS — Z66 Do not resuscitate: Secondary | ICD-10-CM | POA: Diagnosis not present

## 2024-04-19 DIAGNOSIS — M545 Low back pain, unspecified: Secondary | ICD-10-CM | POA: Diagnosis not present

## 2024-04-19 DIAGNOSIS — I2510 Atherosclerotic heart disease of native coronary artery without angina pectoris: Secondary | ICD-10-CM | POA: Diagnosis not present

## 2024-04-19 DIAGNOSIS — C787 Secondary malignant neoplasm of liver and intrahepatic bile duct: Secondary | ICD-10-CM | POA: Diagnosis not present

## 2024-04-19 DIAGNOSIS — C7951 Secondary malignant neoplasm of bone: Secondary | ICD-10-CM | POA: Diagnosis not present

## 2024-04-19 DIAGNOSIS — C3411 Malignant neoplasm of upper lobe, right bronchus or lung: Secondary | ICD-10-CM | POA: Diagnosis not present

## 2024-04-19 LAB — CBC WITH DIFFERENTIAL (CANCER CENTER ONLY)
Abs Immature Granulocytes: 0.01 K/uL (ref 0.00–0.07)
Basophils Absolute: 0 K/uL (ref 0.0–0.1)
Basophils Relative: 1 %
Eosinophils Absolute: 0.1 K/uL (ref 0.0–0.5)
Eosinophils Relative: 3 %
HCT: 41 % (ref 39.0–52.0)
Hemoglobin: 13.3 g/dL (ref 13.0–17.0)
Immature Granulocytes: 0 %
Lymphocytes Relative: 17 %
Lymphs Abs: 0.8 K/uL (ref 0.7–4.0)
MCH: 27.1 pg (ref 26.0–34.0)
MCHC: 32.4 g/dL (ref 30.0–36.0)
MCV: 83.7 fL (ref 80.0–100.0)
Monocytes Absolute: 0.5 K/uL (ref 0.1–1.0)
Monocytes Relative: 11 %
Neutro Abs: 3.1 K/uL (ref 1.7–7.7)
Neutrophils Relative %: 68 %
Platelet Count: 111 K/uL — ABNORMAL LOW (ref 150–400)
RBC: 4.9 MIL/uL (ref 4.22–5.81)
RDW: 14.3 % (ref 11.5–15.5)
WBC Count: 4.4 K/uL (ref 4.0–10.5)
nRBC: 0 % (ref 0.0–0.2)

## 2024-04-19 LAB — CMP (CANCER CENTER ONLY)
ALT: 64 U/L — ABNORMAL HIGH (ref 0–44)
AST: 59 U/L — ABNORMAL HIGH (ref 15–41)
Albumin: 4.2 g/dL (ref 3.5–5.0)
Alkaline Phosphatase: 141 U/L — ABNORMAL HIGH (ref 38–126)
Anion gap: 6 (ref 5–15)
BUN: 21 mg/dL (ref 8–23)
CO2: 29 mmol/L (ref 22–32)
Calcium: 8.5 mg/dL — ABNORMAL LOW (ref 8.9–10.3)
Chloride: 105 mmol/L (ref 98–111)
Creatinine: 1.02 mg/dL (ref 0.61–1.24)
GFR, Estimated: >60 mL/min (ref >60)
Glucose, Bld: 139 mg/dL — ABNORMAL HIGH (ref 70–99)
Potassium: 4 mmol/L (ref 3.5–5.1)
Sodium: 140 mmol/L (ref 135–145)
Total Bilirubin: 0.5 mg/dL (ref 0.0–1.2)
Total Protein: 7 g/dL (ref 6.5–8.1)

## 2024-04-19 LAB — MAGNESIUM: Magnesium: 2.3 mg/dL (ref 1.7–2.4)

## 2024-04-19 NOTE — Assessment & Plan Note (Addendum)
 CT scan reveals a mass in the right upper lobe with possible lung collapse due to airway obstruction. 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  On 02/14/2024, he underwent navigational bronchoscopy with biopsy of right upper lobe lung mass.  Pathology confirmed non-small cell carcinoma, consistent with adenocarcinoma.  Immunostains positive for TTF-1, Napsin and negative for p40.  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  Low TMB.  Microsatellite stable disease.  On 02/28/2024, staging PET scan showed masslike consolidation and volume loss in the right upper lobe with associated central hypermetabolic activity, consistent with biopsy-proven non-small cell carcinoma.  Mildly hypermetabolic mediastinal and right hilar lymph nodes consistent with nodal metastasis.  Multiple hypermetabolic sclerotic osseous lesions in the sternum, right scapula, sacral area.  No hypermetabolic activity within the liver.  Moderate right sided pleural effusion without hypermetabolic activity.  Patient cannot do MRI because of metallic hardware. CT head with and without contrast on 02/28/2024 showed no intracranial metastatic disease.  Previously I discussed staging, prognosis, plan of care, treatment options.  Reviewed NCCN guidelines.  Given EGFR mutation positivity, plan made to proceed with osimertinib  80 mg orally daily.  He started this from 03/02/2024.  Tolerating this fairly well except for some diarrhea and nausea.  Given bone metastatic disease, he was also started on Xgeva to prevent pathological fractures from 03/21/2024.   Given hypocalcemia, will hold Xgeva today.  He is seeing his dentist next week.  We will get formal letter from his dentist for clearance.  He was advised to increase calcium  dosing to 600 mg orally twice daily.  Given stage IV disease, all treatment options are palliative in nature and not  curative in intent.  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  We are hoping that his clinical symptoms improve with continued treatment.  RTC in 3 or 5 weeks for labs, office visit and possible Xgeva injection.

## 2024-04-19 NOTE — Progress Notes (Signed)
  [facility]  ONCOLOGY CLINIC PROGRESS NOTE   Patient Care Team: Charlott Dorn LABOR, MD as PCP - General (Internal Medicine) Anner Alm ORN, MD as PCP - Cardiology (Cardiology) Shelah Lamar RAMAN, MD as Consulting Physician (Pulmonary Disease) Ruthell Lauraine FALCON, NP as Nurse Practitioner (Pulmonary Disease) Prentis Duwaine BROCKS, RN as Oncology Nurse Navigator  PATIENT NAME: Jacob Dalton   MR#: [id number] DOB: [birth year]  Date of visit: 04/19/2024   ASSESSMENT & PLAN:   Jacob Dalton is a 78 y.o. gentleman with a past medical history of CAD status post PCI, hypertension, diabetes, peripheral neuropathy, GERD, past history of smoking (pipe), status post partial gastrectomy from trauma wound, vitamin B12 deficiency, chronic thrombocytopenia, was referred to our clinic for right upper lobe lung mass that was suspicious for lung malignancy, with possible liver and bone mets.  He underwent biopsy of the right upper lobe lung mass on 02/14/2024 which confirmed adenocarcinoma of the lung.  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  Started on osimertinib  80 mg daily from 03/02/2024.  Adenocarcinoma of right lung, stage 4 (HCC) CT scan reveals a mass in the right upper lobe with possible lung collapse due to airway obstruction. 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  On 02/14/2024, he underwent navigational bronchoscopy with biopsy of right upper lobe lung mass.  Pathology confirmed non-small cell carcinoma, consistent with adenocarcinoma.  Immunostains positive for TTF-1, Napsin and negative for p40.  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  Low TMB.  Microsatellite stable disease.  On 02/28/2024, staging PET scan showed masslike consolidation and volume loss in the right upper lobe with associated central hypermetabolic activity, consistent with biopsy-proven non-small cell carcinoma.   Mildly hypermetabolic mediastinal and right hilar lymph nodes consistent with nodal metastasis.  Multiple hypermetabolic sclerotic osseous lesions in the sternum, right scapula, sacral area.  No hypermetabolic activity within the liver.  Moderate right sided pleural effusion without hypermetabolic activity.  Patient cannot do MRI because of metallic hardware. CT head with and without contrast on 02/28/2024 showed no intracranial metastatic disease.  Previously I discussed staging, prognosis, plan of care, treatment options.  Reviewed NCCN guidelines.  Given EGFR mutation positivity, plan made to proceed with osimertinib  80 mg orally daily.  He started this from 03/02/2024.  Tolerating this fairly well except for some diarrhea and nausea.  Given bone metastatic disease, he was also started on Xgeva to prevent pathological fractures from 03/21/2024.   Given hypocalcemia, will hold Xgeva today.  He is seeing his dentist next week.  We will get formal letter from his dentist for clearance.  He was advised to increase calcium  dosing to 600 mg orally twice daily.  Given stage IV disease, all treatment options are palliative in nature and not curative in intent.  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  We are hoping that his clinical symptoms improve with continued treatment.  RTC in 3 or 5 weeks for labs, office visit and possible Xgeva injection.  Cough and dyspnea related to lung cancer Cough exacerbated by bending over. Using albuterol  and spirometer to improve breathing. Symptoms expected to improve as treatment progresses. - Continue using albuterol  inhaler and spirometer regularly to improve breathing and reduce cough.  Diarrhea secondary to cancer therapy and prior gastric surgery Diarrhea is more frequent and watery, likely exacerbated by Tagrisso  and history of gastric surgery. Tagrisso  can cause diarrhea in about  one-third of patients. - Instruct to take two Imodium tablets at  first loose bowel movement, then one tablet after each subsequent loose bowel movement, not exceeding eight tablets per day.  Mild hypocalcemia Hypocalcemia secondary to bone-targeted therapy Hypocalcemia with calcium  level at 8.5, decreased from 8.8, likely due to Xgeva (denosumab) injection. Risk of cardiac issues if calcium  levels drop too low. Injection skipped today due to low calcium  levels. Dental clearance required before resuming Xgeva due to risk of jaw necrosis. - Increase calcium  intake to 600 mg twice daily, morning and night. - Hold Xgeva injection until calcium  levels improve and dental clearance is obtained.  Thrombocytopenia, chronic Chronic thrombocytopenia at least since 2008 with platelet count at 111,000, consistent with historical levels. No acute intervention required as levels have been stable over time. - Monitor platelet levels during routine blood work.  Shoulder pain, likely musculoskeletal Shoulder pain, etiology unclear, possibly related to recent fall or use of the arm.   Right-sided malignant pleural effusion Pleural effusion on the right side associated with lung cancer. Expected to improve with targeted therapy. If fluid persists, a thoracentesis may be considered. - Monitor pleural effusion and consider thoracentesis if fluid persists. - Encourage use of spirometer twice daily to help expand lungs.  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 Current hemoglobin and white count are stable. - Check iron , B12, and folic acid  levels at next visit.  I reviewed lab results and outside records for this visit and discussed relevant results with the patient. Diagnosis, plan of care and treatment options were also discussed in detail with the patient. Opportunity provided to ask questions and answers provided to his apparent satisfaction. Provided instructions to call our clinic with  any problems, questions or concerns prior to return visit. I recommended to continue follow-up with PCP and sub-specialists. He verbalized understanding and agreed with the plan.   NCCN guidelines have been consulted in the planning of this patient's care.  I spent a total of 42 minutes during this encounter with the patient including review of chart and various tests results, discussions about plan of care and coordination of care plan.   [name], MD  04/19/2024 12:12 PM  [facility] - [facility] DE[facility] 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   Current Treatment: Started taking osimertinib  80 mg daily on 03/02/2024.  INTERVAL HISTORY:    Discussed the use of AI scribe software for clinical note transcription with the patient, who gave verbal consent to proceed.  History of Present Illness  Jacob Dalton is an 78 year old male with a history of gastric surgery and cancer who presents with increased frequency and severity of diarrhea.  He has experienced diarrhea twice daily for the past two days, typically occurring after breakfast and before lunch. The patient reports that the diarrhea is more watery and frequent than usual and is harder to control. He has a history of diarrhea following gastric surgery. He is currently taking Tagrisso , which he started taking later in the day than previously scheduled.  His appetite and energy levels have improved, although he still feels inclined to take a nap in  the afternoon. He has been able to go out for two nights this week, indicating some improvement in his energy levels.  He experiences a cough when bending over or standing slightly off  vertical.  He has a history of low calcium  levels.  He has a history of dental issues, including a chipped tooth and puffiness in the jaw area, which he plans to address with a dentist.  He has a history of back injury and weakness in his right leg, which he attributes to a previous injury. He also has weakness in his left leg from war injuries. He has had a steroid injection in the past for back pain, which did not provide relief.  His platelet count has been fluctuating since 2008, with a current count of 111,000, which is slightly lower than normal. His hemoglobin and white blood cell counts are currently stable.    I have reviewed the past medical history, past surgical history, social history and family history with the patient and they are unchanged from previous note.  HISTORY OF PRESENT ILLNESS:   ONCOLOGY HISTORY:   He has a history of back pain that began approximately in January 2025, leading to multiple diagnostic tests including a myelogram and various bone scans, none of which showed significant findings.    Later on he developed cough, shortness of breath and chest x-ray was abnormal which led to CT of the chest without contrast on 01/27/2024 which showed findings concerning for right upper lobe lung malignancy with possible liver metastatic disease and bone metastatic disease.   CT Chest without contrast on 01/27/2024: Large, masslike peribronchial consolidation in the right upper lobe, measuring 4.2 x 7.3 x 7.5 cm, especially worrisome for primary lung malignancy. Interlobular septal thickening and areas of micronodularity throughout the remainder of the right upper lobe is worrisome for lymphangitic spread of disease. Bony sclerosis in the lateral right sternomanubrial joint and scattered throughout the visualized thoracolumbar spine (the largest lesion in the left lateral T9 vertebral body), worrisome for metastatic disease. Heterogeneous appearance of the liver parenchyma with a  couple of small hypodensities (measuring up to 1.3 cm in the left hepatic lobe). A nonemergent CT of the abdomen and pelvis with IV contrast is recommended to assess for metastatic disease.    CT Abdomen and Pelvis with and without contrast on 02/03/2024 showed hypodense ill-defined 14 mm segment IV b hepatic lesion demonstrating a questionable peripheral nodular focus of enhancement on arterial phase decreasing in size peripherally over subsequent postcontrast sequences. Nonspecific imaging characteristics possibly reflecting a benign hepatic hemangioma but is incompletely characterized on this examination and metastasis while not definitively favored is not excluded. Consider more definitive characterization with hepatic protocol MRI with and without contrast. If the definitive diagnosis of these would NOT adjust immediate therapy options would suggest follow-up in 2-3 months by MRI to allow assessment of the additional variable of change over time to help in the evaluation of these lesions . Ill-defined subtle tiny hypodensities in the right and left lobes of the liver measuring 4 mm and 5 mm respectively are also incompletely characterized. Attention on follow-up MRI imaging suggested. 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 Symmetric distal esophageal wall thickening, which may reflect esophagitis. Enlarged prostate gland. Aortic atherosclerosis.   He had consultation in pulmonology clinic on 02/07/2024 and is scheduled for robotic assisted navigational bronchoscopy with biopsies on 02/14/2024.   His past medical history includes significant surgical history from an  injury sustained during the [facility] war, resulting in the loss of part of his liver, 80% of his stomach, gallbladder, appendix, and vagus nerves. He also has a history of a drug-eluting stent placement in his LAD due to a 99% occlusion, for which he takes Plavix . He uses  a CPAP machine for sleep apnea, although he has had difficulty with mask fit and compliance recently. He has low-level diabetes, previously managed with metformin, but currently not on medication. His A1c has been stable between 6 and 7.   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 Engineer, Building Services, he pursued higher education, completing college and law school. No significant exposures post-military, aside from living in an old house and exposure to dust in his garage.   He lives with his wife , he is a retired Programmer, Multimedia and judge. He has adult children. Family history of allergies, asthma, heart disease rheumatism and cancer.He drinks one beer 3 times a week.    He does experience shortness of breath with activity. He has experienced weight loss, noting a decrease of about ten pounds over the last six months, with a recent drop in appetite. No hemoptysis or any significant respiratory symptoms aside from a cough that he attributes to his current situation. The cough is frequent and non productive.   On 02/14/2024, he underwent navigational bronchoscopy with biopsy of right upper lobe lung mass.  Pathology confirmed non-small cell carcinoma, consistent with adenocarcinoma.  Immunostains positive for TTF-1, Napsin and negative for p40.  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  Low TMB.  Microsatellite stable disease.  On 02/28/2024, staging PET scan showed masslike consolidation and volume loss in the right upper lobe with associated central hypermetabolic activity, consistent with biopsy-proven non-small cell carcinoma.  Mildly hypermetabolic mediastinal and right hilar lymph nodes consistent with nodal metastasis.  Multiple hypermetabolic sclerotic osseous lesions in the sternum, right scapula, sacral area.  No hypermetabolic activity within the liver.  Moderate  right sided pleural effusion without hypermetabolic activity.  Patient cannot do MRI because of metallic hardware. CT head with and without contrast on 02/28/2024 showed no intracranial metastatic disease.  Given EGFR mutation positivity, plan made to proceed with osimertinib  80 mg orally daily.  Started this from 03/02/2024.  Given bone metastatic disease, he was also started on Xgeva from 03/21/2024, to prevent pathological fractures.  It is currently on hold because of hypocalcemia and pending dental clearance.  Oncology History  Adenocarcinoma of right lung, stage 4 (HCC)  02/07/2024 Initial Diagnosis   Adenocarcinoma of right lung, stage 4 (HCC)   02/29/2024 Cancer Staging   Staging form: Lung, AJCC V9 - Clinical: Stage IVB (cT4, cN2b, cM1c1) - Signed by Autumn Millman, MD on 02/29/2024 Method of lymph node assessment: Clinical       REVIEW OF SYSTEMS:   Review of Systems - Oncology  All other pertinent systems were reviewed with the patient and are negative.  ALLERGIES: He is allergic to codeine, fluvoxamine maleate, metformin hcl er, oxybutynin, simvastatin, sulfamethoxazole-trimethoprim, other, atorvastatin , gold, and nsaids.  MEDICATIONS:  Current Outpatient Medications  Medication Sig Dispense Refill   acetaminophen  (TYLENOL ) 500 MG tablet 1 tablet as needed Orally every 12 hours     albuterol  (VENTOLIN  HFA) 108 (90 Base) MCG/ACT inhaler Inhale 2 puffs into the lungs every 6 (six) hours as needed for wheezing or shortness of  breath. 8 g 2   buPROPion (WELLBUTRIN XL) 300 MG 24 hr tablet Take 300 mg by mouth daily.     calcipotriene-betamethasone  (TACLONEX SCALP) external suspension Apply 1 application topically daily as needed (SCALP PSORASIS).      clobetasol (TEMOVATE) 0.05 % external solution Apply topically.     clopidogrel  (PLAVIX ) 75 MG tablet TAKE 1 TABLET(75 MG) BY MOUTH DAILY 90 tablet 3   Cyanocobalamin  (VITAMIN B12) 1000 MCG TBCR Take 1,000 mcg by mouth daily.       fluoruracil (CARAC) 0.5 % cream Apply 1 application topically daily as needed (Burn of pre-cancer cells).      fluticasone (FLONASE) 50 MCG/ACT nasal spray Place 1 spray into both nostrils daily as needed for allergies.     glucose blood (FREESTYLE LITE) test strip as directed In Vitro once a day dx:E11.9; Duration: 30 days     guaiFENesin-dextromethorphan (ROBITUSSIN DM) 100-10 MG/5ML syrup Take 5 mLs by mouth every 4 (four) hours as needed for cough.     Multiple Vitamins-Iron  (MULTIVITAMIN/IRON  PO) Take 1 tablet by mouth daily.     nitroGLYCERIN  (NITROSTAT ) 0.4 MG SL tablet Place 1 tablet (0.4 mg total) under the tongue every 5 (five) minutes as needed for chest pain. 25 tablet 3   ondansetron  (ZOFRAN -ODT) 8 MG disintegrating tablet Take 1 tablet (8 mg total) by mouth every 8 (eight) hours as needed for nausea or vomiting. 30 tablet 3   osimertinib  mesylate (TAGRISSO ) 80 MG tablet Take 1 tablet (80 mg total) by mouth daily. 30 tablet 5   PROBIOTIC, LACTOBACILLUS, PO Take by mouth.     rosuvastatin (CRESTOR) 5 MG tablet Take 5 mg by mouth at bedtime.      simethicone (MYLICON) 80 MG chewable tablet Chew 80-240 mg by mouth every 6 (six) hours as needed for flatulence.      sodium fluoride-calcium  carbonate (FLORICAL) 8.3-364 MG CAPS capsule Take 1 capsule by mouth 2 (two) times daily.     tadalafil (CIALIS) 5 MG tablet Take 5 mg by mouth daily as needed for erectile dysfunction.     Vibegron (GEMTESA) 75 MG TABS Take 75 mg by mouth daily.     Vitamin D-Vitamin K (VITAMIN K2-VITAMIN D3 PO) Take 1 tablet by mouth daily.     No current facility-administered medications for this visit.     VITALS:   Blood pressure 138/68, pulse 75, temperature 98.6 F (37 C), temperature source Temporal, resp. rate 16, height 5' 9.5 (1.765 m), weight 151 lb 9.6 oz (68.8 kg), SpO2 100%.  Wt Readings from Last 3 Encounters:  04/19/24 151 lb 9.6 oz (68.8 kg)  03/21/24 148 lb (67.1 kg)  02/29/24 149 lb (67.6 kg)     Body mass index is 22.07 kg/m.   Onc Performance Status - 04/19/24 1200       ECOG Perf Status   ECOG Perf Status Restricted in physically strenuous activity but ambulatory and able to carry out work of a light or sedentary nature, e.g., light house work, office work      KPS SCALE   KPS % SCORE Normal activity with effort, some s/s of disease            PHYSICAL EXAM:   Physical Exam Constitutional:      General: He is not in acute distress.    Appearance: Normal appearance.  HENT:     Head: Normocephalic and atraumatic.  Eyes:     Conjunctiva/sclera: Conjunctivae normal.  Cardiovascular:  Rate and Rhythm: Normal rate and regular rhythm.  Pulmonary:     Effort: Pulmonary effort is normal. No respiratory distress.     Comments: Diminished breath sounds at the right base.  Clear to auscultation elsewhere. Abdominal:     General: There is no distension.  Neurological:     General: No focal deficit present.     Mental Status: He is alert and oriented to person, place, and time.  Psychiatric:        Mood and Affect: Mood normal.        Behavior: Behavior normal.       LABORATORY DATA:   I have reviewed the data as listed.  Results for orders placed or performed in visit on 04/19/24  Magnesium  Result Value Ref Range   Magnesium 2.3 1.7 - 2.4 mg/dL  CMP (Cancer Center only)  Result Value Ref Range   Sodium 140 135 - 145 mmol/L   Potassium 4.0 3.5 - 5.1 mmol/L   Chloride 105 98 - 111 mmol/L   CO2 29 22 - 32 mmol/L   Glucose, Bld 139 (H) 70 - 99 mg/dL   BUN 21 8 - 23 mg/dL   Creatinine 8.97 9.38 - 1.24 mg/dL   Calcium  8.5 (L) 8.9 - 10.3 mg/dL   Total Protein 7.0 6.5 - 8.1 g/dL   Albumin 4.2 3.5 - 5.0 g/dL   AST 59 (H) 15 - 41 U/L   ALT 64 (H) 0 - 44 U/L   Alkaline Phosphatase 141 (H) 38 - 126 U/L   Total Bilirubin 0.5 0.0 - 1.2 mg/dL   GFR, Estimated >39 >39 mL/min   Anion gap 6 5 - 15  CBC with Differential (Cancer Center Only)  Result Value  Ref Range   WBC Count 4.4 4.0 - 10.5 K/uL   RBC 4.90 4.22 - 5.81 MIL/uL   Hemoglobin 13.3 13.0 - 17.0 g/dL   HCT 58.9 60.9 - 47.9 %   MCV 83.7 80.0 - 100.0 fL   MCH 27.1 26.0 - 34.0 pg   MCHC 32.4 30.0 - 36.0 g/dL   RDW 85.6 88.4 - 84.4 %   Platelet Count 111 (L) 150 - 400 K/uL   nRBC 0.0 0.0 - 0.2 %   Neutrophils Relative % 68 %   Neutro Abs 3.1 1.7 - 7.7 K/uL   Lymphocytes Relative 17 %   Lymphs Abs 0.8 0.7 - 4.0 K/uL   Monocytes Relative 11 %   Monocytes Absolute 0.5 0.1 - 1.0 K/uL   Eosinophils Relative 3 %   Eosinophils Absolute 0.1 0.0 - 0.5 K/uL   Basophils Relative 1 %   Basophils Absolute 0.0 0.0 - 0.1 K/uL   Immature Granulocytes 0 %   Abs Immature Granulocytes 0.01 0.00 - 0.07 K/uL      RADIOGRAPHIC STUDIES:  I have personally reviewed the radiological images as listed and agree with the findings in the report.  CT HEAD W & WO CONTRAST ( ) CLINICAL DATA:  Initial evaluation for possible intracranial metastatic disease. Intermittent headaches.  EXAM: CT HEAD WITHOUT AND WITH CONTRAST  TECHNIQUE: Contiguous axial images were obtained from the base of the skull through the vertex without and with intravenous contrast.  RADIATION DOSE REDUCTION: This exam was performed according to the departmental dose-optimization program which includes automated exposure control, adjustment of the mA and/or kV according to patient size and/or use of iterative reconstruction technique.  CONTRAST:  75mL OMNIPAQUE  IOHEXOL  300 MG/ML  SOLN  COMPARISON:  PET-CT  from earlier the same day as well as prior head CT from 03/05/2020.  FINDINGS: Brain: Mild age-related cerebral atrophy. Patchy hypodensity involving the periventricular deep white matter both cerebral hemispheres, consistent with chronic small vessel ischemic disease, mild-to-moderate in nature. No acute intracranial hemorrhage. No acute large vessel territory infarct. No visible mass lesion or evidence for  intracranial metastatic disease. No abnormal enhancement. Diffuse ventricular prominence related global parenchymal volume loss without hydrocephalus. No extra-axial fluid collection.  Vascular: No abnormal hyperdense vessel seen prior to contrast administration. Following contrast administration, normal enhancement seen throughout the major intracranial vascular structures. Scattered vascular calcifications noted within the carotid siphons.  Skull: Scalp soft tissues demonstrate no acute finding. Calvarium intact. No visible focal osseous lesions.  Sinuses/Orbits: Globes and orbital soft tissues within normal limits. Mild scattered mucosal thickening present about the ethmoidal air cells. Paranasal sinuses are otherwise clear. No mastoid effusion.  Other: None.  IMPRESSION: 1. No acute intracranial abnormality. No evidence for intracranial metastatic disease. 2. Mild age-related cerebral atrophy with mild-to-moderate chronic small vessel ischemic disease.  Electronically Signed   By: Morene Hoard M.D.   On: 02/28/2024 20:33 NM PET Image Initial (PI) Skull Base To Thigh CLINICAL DATA:  Initial treatment strategy for suspected lung cancer with hepatic and osseous metastatic disease.  EXAM: NUCLEAR MEDICINE PET SKULL BASE TO THIGH  TECHNIQUE: 7.33 mCi F-18 FDG was injected intravenously. Full-ring PET imaging was performed from the skull base to thigh after the radiotracer. CT data was obtained and used for attenuation correction and anatomic localization.  Fasting blood glucose: 126 mg/dl  COMPARISON:  Chest CT 02/16/2024 and 01/27/2024. Abdominopelvic CT 02/03/2024.  FINDINGS: Mediastinal blood pool activity: SUV max 1.8  NECK:  No hypermetabolic cervical lymph nodes are identified. No suspicious activity identified within the pharyngeal mucosal space.  Incidental CT findings: Intracranial atrophy noted.  CHEST:  Persistent mass-like consolidation  and volume loss in the right upper lobe with associated central hypermetabolic activity (SUV max 6.1). Persistent associated right upper lobe bronchial narrowing. No hypermetabolic activity or suspicious nodularity elsewhere within the lungs. There are several small mildly hypermetabolic mediastinal and right hilar lymph nodes, including a subcarinal node with an SUV max of 4.0 and a right hilar node with an SUV max of 3.6. No hypermetabolic axillary adenopathy.  Incidental CT findings: Enlarging moderate-sized right pleural effusion without associated hypermetabolic activity. Mild centrilobular and paraseptal emphysema. Coronary atherosclerosis noted.  ABDOMEN/PELVIS:  No hypermetabolic activity identified within the liver to correspond with the previously demonstrated lesions. There is no hypermetabolic activity within the spleen, pancreas or adrenal glands. There is no hypermetabolic nodal activity in the abdomen or pelvis. Bowel activity within physiologic limits.  Incidental CT findings: Surgical clips at the gastroesophageal junction. Aortic and branch vessel atherosclerosis. The prostate gland is mildly enlarged.  SKELETON:  Again demonstrated are multiple sclerotic osseous lesions with associated hypermetabolic activity, consistent with metastatic disease. Representative lesions include a lesion in the upper left sternum with an SUV max of 4.5, a T9 lesion with an SUV max of 6.0, right scapular lesion (SUV max 4.8) and a central sacral lesion (SUV max 5.2). No definite pathologic fracture identified.  Incidental CT findings: Multilevel spondylosis associated with a convex left lumbar scoliosis.  IMPRESSION: 1. Persistent mass-like consolidation and volume loss in the right upper lobe with associated central hypermetabolic activity, consistent with biopsy proven non-small cell carcinoma. 2. Mildly hypermetabolic mediastinal and right hilar lymph nodes, suspicious for  nodal metastases. 3. Multiple hypermetabolic  sclerotic osseous lesions consistent with metastatic disease. 4. No hypermetabolic activity identified within the liver to correspond with the previously demonstrated lesions. 5. Enlarging moderate-sized right pleural effusion without associated hypermetabolic activity. 6. Aortic Atherosclerosis (ICD10-I70.0) and Emphysema (ICD10-J43.9).  Electronically Signed   By: Elsie Perone M.D.   On: 02/28/2024 17:15      CODE STATUS:  Advance Directive Documentation    Flowsheet Row Most Recent Value  Type of Advance Directive Out of facility DNR (pink MOST or yellow form)  Pre-existing out of facility DNR order (yellow form or pink MOST form) --  MOST Form in Place? --    No orders of the defined types were placed in this encounter.    Future Appointments  Date Time Provider Department Center  04/19/2024 12:15 PM [facility] None     This document was completed utilizing speech recognition software. Grammatical errors, random word insertions, pronoun errors, and incomplete sentences are an occasional consequence of this system due to software limitations, ambient noise, and hardware issues. Any formal questions or concerns about the content, text or information contained within the body of this dictation should be directly addressed to the provider for clarification.

## 2024-04-19 NOTE — Progress Notes (Unsigned)
 Insurance is not paying, messaged Sam Hoover, [place] for benefits investigation.  Patient may be required to fill at the [place].

## 2024-04-20 ENCOUNTER — Other Ambulatory Visit (HOSPITAL_COMMUNITY): Payer: Self-pay

## 2024-04-23 ENCOUNTER — Encounter: Payer: Self-pay | Admitting: Oncology

## 2024-04-23 ENCOUNTER — Encounter: Payer: Self-pay | Admitting: Radiology

## 2024-04-24 ENCOUNTER — Other Ambulatory Visit: Payer: Self-pay

## 2024-04-24 ENCOUNTER — Other Ambulatory Visit (HOSPITAL_COMMUNITY): Payer: Self-pay

## 2024-04-24 ENCOUNTER — Telehealth: Payer: Self-pay

## 2024-04-24 ENCOUNTER — Other Ambulatory Visit: Payer: Self-pay | Admitting: Oncology

## 2024-04-24 DIAGNOSIS — C3491 Malignant neoplasm of unspecified part of right bronchus or lung: Secondary | ICD-10-CM

## 2024-04-24 MED ORDER — OSIMERTINIB MESYLATE 80 MG PO TABS: 80.0000 mg | ORAL_TABLET | Freq: Every day | ORAL | 5 refills | Status: DC

## 2024-04-24 NOTE — Telephone Encounter (Signed)
 Oral Oncology Patient Advocate Encounter  I was notified that the patient must now get his refills of Tagrisso  at a [place] pharmacy. I reached out to the [facility] and the pharmacist told me that Charlena will need a referral from [facility] primary [facility] provider in order for the [place] pharmacy to fill the prescription. I notified his wife Rock and she is going to contact Dr. Onetha to get the referral done.  I also went ahead and asked Dr. Autumn to send in the rx to the [facility] and I faxed over his last office visit notes and the criteria for use form to get the PA started for them while we wait on the referral.  Lucie Lamer, CPhT [facility]  [facility] Oncology Pharmacy Patient Advocate Specialist [facility] [place] Phone: [phone]  Fax: [phone] [email or web address]

## 2024-04-26 ENCOUNTER — Other Ambulatory Visit (HOSPITAL_COMMUNITY): Payer: Self-pay

## 2024-04-26 ENCOUNTER — Other Ambulatory Visit: Payer: Self-pay

## 2024-04-26 DIAGNOSIS — M545 Low back pain, unspecified: Secondary | ICD-10-CM | POA: Diagnosis not present

## 2024-04-26 DIAGNOSIS — C3491 Malignant neoplasm of unspecified part of right bronchus or lung: Secondary | ICD-10-CM

## 2024-04-26 MED ORDER — OSIMERTINIB MESYLATE 80 MG PO TABS: 80.0000 mg | ORAL_TABLET | Freq: Every day | ORAL | 5 refills | Status: AC

## 2024-05-01 DIAGNOSIS — M545 Low back pain, unspecified: Secondary | ICD-10-CM | POA: Diagnosis not present

## 2024-05-03 DIAGNOSIS — M545 Low back pain, unspecified: Secondary | ICD-10-CM | POA: Diagnosis not present

## 2024-05-07 DIAGNOSIS — M545 Low back pain, unspecified: Secondary | ICD-10-CM | POA: Diagnosis not present

## 2024-05-09 DIAGNOSIS — M545 Low back pain, unspecified: Secondary | ICD-10-CM | POA: Diagnosis not present

## 2024-05-22 DIAGNOSIS — M545 Low back pain, unspecified: Secondary | ICD-10-CM | POA: Diagnosis not present

## 2024-05-24 DIAGNOSIS — M545 Low back pain, unspecified: Secondary | ICD-10-CM | POA: Diagnosis not present

## 2024-05-25 ENCOUNTER — Inpatient Hospital Stay

## 2024-05-25 ENCOUNTER — Inpatient Hospital Stay: Attending: Oncology

## 2024-05-25 ENCOUNTER — Inpatient Hospital Stay: Admitting: Oncology

## 2024-05-25 ENCOUNTER — Encounter: Payer: Self-pay | Admitting: Oncology

## 2024-05-25 VITALS — BP 124/59 | HR 80 | Temp 97.5°F | Resp 17 | Wt 151.8 lb

## 2024-05-25 DIAGNOSIS — C3491 Malignant neoplasm of unspecified part of right bronchus or lung: Secondary | ICD-10-CM

## 2024-05-25 DIAGNOSIS — R059 Cough, unspecified: Secondary | ICD-10-CM | POA: Diagnosis not present

## 2024-05-25 DIAGNOSIS — E1142 Type 2 diabetes mellitus with diabetic polyneuropathy: Secondary | ICD-10-CM | POA: Insufficient documentation

## 2024-05-25 DIAGNOSIS — D696 Thrombocytopenia, unspecified: Secondary | ICD-10-CM | POA: Insufficient documentation

## 2024-05-25 DIAGNOSIS — G319 Degenerative disease of nervous system, unspecified: Secondary | ICD-10-CM | POA: Diagnosis not present

## 2024-05-25 DIAGNOSIS — I2510 Atherosclerotic heart disease of native coronary artery without angina pectoris: Secondary | ICD-10-CM | POA: Diagnosis not present

## 2024-05-25 DIAGNOSIS — R110 Nausea: Secondary | ICD-10-CM | POA: Diagnosis not present

## 2024-05-25 DIAGNOSIS — I6782 Cerebral ischemia: Secondary | ICD-10-CM | POA: Diagnosis not present

## 2024-05-25 DIAGNOSIS — R197 Diarrhea, unspecified: Secondary | ICD-10-CM | POA: Diagnosis not present

## 2024-05-25 DIAGNOSIS — Z7902 Long term (current) use of antithrombotics/antiplatelets: Secondary | ICD-10-CM | POA: Diagnosis not present

## 2024-05-25 DIAGNOSIS — J439 Emphysema, unspecified: Secondary | ICD-10-CM | POA: Insufficient documentation

## 2024-05-25 DIAGNOSIS — I700 Atherosclerosis of aorta: Secondary | ICD-10-CM | POA: Diagnosis not present

## 2024-05-25 DIAGNOSIS — J910 Malignant pleural effusion: Secondary | ICD-10-CM | POA: Diagnosis not present

## 2024-05-25 DIAGNOSIS — Z903 Acquired absence of stomach [part of]: Secondary | ICD-10-CM | POA: Insufficient documentation

## 2024-05-25 DIAGNOSIS — N400 Enlarged prostate without lower urinary tract symptoms: Secondary | ICD-10-CM | POA: Insufficient documentation

## 2024-05-25 DIAGNOSIS — C7951 Secondary malignant neoplasm of bone: Secondary | ICD-10-CM | POA: Insufficient documentation

## 2024-05-25 DIAGNOSIS — M533 Sacrococcygeal disorders, not elsewhere classified: Secondary | ICD-10-CM | POA: Diagnosis not present

## 2024-05-25 DIAGNOSIS — K229 Disease of esophagus, unspecified: Secondary | ICD-10-CM | POA: Diagnosis not present

## 2024-05-25 DIAGNOSIS — Z882 Allergy status to sulfonamides status: Secondary | ICD-10-CM | POA: Insufficient documentation

## 2024-05-25 DIAGNOSIS — Z825 Family history of asthma and other chronic lower respiratory diseases: Secondary | ICD-10-CM | POA: Insufficient documentation

## 2024-05-25 DIAGNOSIS — Z79899 Other long term (current) drug therapy: Secondary | ICD-10-CM | POA: Insufficient documentation

## 2024-05-25 DIAGNOSIS — Z885 Allergy status to narcotic agent status: Secondary | ICD-10-CM | POA: Insufficient documentation

## 2024-05-25 DIAGNOSIS — I10 Essential (primary) hypertension: Secondary | ICD-10-CM | POA: Insufficient documentation

## 2024-05-25 DIAGNOSIS — Z8249 Family history of ischemic heart disease and other diseases of the circulatory system: Secondary | ICD-10-CM | POA: Insufficient documentation

## 2024-05-25 DIAGNOSIS — G4730 Sleep apnea, unspecified: Secondary | ICD-10-CM | POA: Insufficient documentation

## 2024-05-25 DIAGNOSIS — Z87891 Personal history of nicotine dependence: Secondary | ICD-10-CM | POA: Diagnosis not present

## 2024-05-25 DIAGNOSIS — M25511 Pain in right shoulder: Secondary | ICD-10-CM | POA: Diagnosis not present

## 2024-05-25 DIAGNOSIS — C787 Secondary malignant neoplasm of liver and intrahepatic bile duct: Secondary | ICD-10-CM | POA: Diagnosis not present

## 2024-05-25 DIAGNOSIS — Z886 Allergy status to analgesic agent status: Secondary | ICD-10-CM | POA: Insufficient documentation

## 2024-05-25 DIAGNOSIS — C3411 Malignant neoplasm of upper lobe, right bronchus or lung: Secondary | ICD-10-CM | POA: Insufficient documentation

## 2024-05-25 DIAGNOSIS — R0600 Dyspnea, unspecified: Secondary | ICD-10-CM | POA: Insufficient documentation

## 2024-05-25 LAB — CMP (CANCER CENTER ONLY)
ALT: 45 U/L — ABNORMAL HIGH (ref 0–44)
AST: 43 U/L — ABNORMAL HIGH (ref 15–41)
Albumin: 4.4 g/dL (ref 3.5–5.0)
Alkaline Phosphatase: 128 U/L — ABNORMAL HIGH (ref 38–126)
Anion gap: 8 (ref 5–15)
BUN: 22 mg/dL (ref 8–23)
CO2: 28 mmol/L (ref 22–32)
Calcium: 8.7 mg/dL — ABNORMAL LOW (ref 8.9–10.3)
Chloride: 104 mmol/L (ref 98–111)
Creatinine: 0.84 mg/dL (ref 0.61–1.24)
GFR, Estimated: >60 mL/min (ref >60)
Glucose, Bld: 87 mg/dL (ref 70–99)
Potassium: 4.5 mmol/L (ref 3.5–5.1)
Sodium: 140 mmol/L (ref 135–145)
Total Bilirubin: 0.4 mg/dL (ref 0.0–1.2)
Total Protein: 7.2 g/dL (ref 6.5–8.1)

## 2024-05-25 LAB — CBC WITH DIFFERENTIAL (CANCER CENTER ONLY)
Abs Immature Granulocytes: 0.02 K/uL (ref 0.00–0.07)
Basophils Absolute: 0 K/uL (ref 0.0–0.1)
Basophils Relative: 1 %
Eosinophils Absolute: 0.2 K/uL (ref 0.0–0.5)
Eosinophils Relative: 3 %
HCT: 41.5 % (ref 39.0–52.0)
Hemoglobin: 13.3 g/dL (ref 13.0–17.0)
Immature Granulocytes: 0 %
Lymphocytes Relative: 16 %
Lymphs Abs: 0.8 K/uL (ref 0.7–4.0)
MCH: 26.8 pg (ref 26.0–34.0)
MCHC: 32 g/dL (ref 30.0–36.0)
MCV: 83.5 fL (ref 80.0–100.0)
Monocytes Absolute: 0.5 K/uL (ref 0.1–1.0)
Monocytes Relative: 10 %
Neutro Abs: 3.6 K/uL (ref 1.7–7.7)
Neutrophils Relative %: 70 %
Platelet Count: 117 K/uL — ABNORMAL LOW (ref 150–400)
RBC: 4.97 MIL/uL (ref 4.22–5.81)
RDW: 14.6 % (ref 11.5–15.5)
WBC Count: 5.1 K/uL (ref 4.0–10.5)
nRBC: 0 % (ref 0.0–0.2)

## 2024-05-25 LAB — IRON AND IRON BINDING CAPACITY (CC-WL,HP ONLY)
Iron: 45 ug/dL (ref 45–182)
Saturation Ratios: 12 % — ABNORMAL LOW (ref 17.9–39.5)
TIBC: 384 ug/dL (ref 250–450)
UIBC: 339 ug/dL

## 2024-05-25 LAB — MAGNESIUM: Magnesium: 2.7 mg/dL — ABNORMAL HIGH (ref 1.7–2.4)

## 2024-05-25 LAB — FOLATE: Folate: >20 ng/mL (ref >5.9)

## 2024-05-25 LAB — VITAMIN B12: Vitamin B-12: 1171 pg/mL — ABNORMAL HIGH (ref 180–914)

## 2024-05-25 LAB — FERRITIN: Ferritin: 138 ng/mL (ref 24–336)

## 2024-05-25 NOTE — Assessment & Plan Note (Addendum)
 CT scan reveals a mass in the right upper lobe with possible lung collapse due to airway obstruction. 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  On 02/14/2024, he underwent navigational bronchoscopy with biopsy of right upper lobe lung mass.  Pathology confirmed non-small cell carcinoma, consistent with adenocarcinoma.  Immunostains positive for TTF-1, Napsin and negative for p40.  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  Low TMB.  Microsatellite stable disease.  On 02/28/2024, staging PET scan showed masslike consolidation and volume loss in the right upper lobe with associated central hypermetabolic activity, consistent with biopsy-proven non-small cell carcinoma.  Mildly hypermetabolic mediastinal and right hilar lymph nodes consistent with nodal metastasis.  Multiple hypermetabolic sclerotic osseous lesions in the sternum, right scapula, sacral area.  No hypermetabolic activity within the liver.  Moderate right sided pleural effusion without hypermetabolic activity.  Patient cannot do MRI because of metallic hardware. CT head with and without contrast on 02/28/2024 showed no intracranial metastatic disease.  Previously I discussed staging, prognosis, plan of care, treatment options.  Reviewed NCCN guidelines.  Given EGFR mutation positivity, plan made to proceed with osimertinib  80 mg orally daily.  He started this from 03/02/2024.  Tolerating this fairly well except for some diarrhea and nausea.  Given bone metastatic disease, he was also started on Xgeva  to prevent pathological fractures from 03/21/2024.   Given hypocalcemia, will hold Xgeva  today.  We did get dental clearance for Xgeva .  He was advised to continue calcium  dosing at 600 mg orally twice daily.  Given stage IV disease, all treatment options are palliative in nature and not curative in intent.  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  We are hoping that his clinical symptoms improve with continued treatment.  RTC in 6 weeks for labs, office visit and possible Xgeva  injection.  Will obtain restaging PET scan prior to that appointment.

## 2024-05-25 NOTE — Progress Notes (Signed)
 [facility]  ONCOLOGY CLINIC PROGRESS NOTE   Patient Care Team: Charlott Dorn LABOR, MD as PCP - General (Internal Medicine) Anner Alm ORN, MD as PCP - Cardiology (Cardiology) Shelah Lamar RAMAN, MD as Consulting Physician (Pulmonary Disease) Ruthell Lauraine FALCON, NP as Nurse Practitioner (Pulmonary Disease) Prentis Duwaine BROCKS, RN as Oncology Nurse Navigator  PATIENT NAME: Jacob Dalton   MR#: [id number] DOB: 1946-04-09  Date of visit: 05/25/2024   ASSESSMENT & PLAN:   Jacob Dalton is a 78 y.o. gentleman with a past medical history of CAD status post PCI, hypertension, diabetes, peripheral neuropathy, GERD, past history of smoking (pipe), status post partial gastrectomy from trauma wound, vitamin B12 deficiency, chronic thrombocytopenia, was referred to our clinic for right upper lobe lung mass that was suspicious for lung malignancy, with possible liver and bone mets.  He underwent biopsy of the right upper lobe lung mass on 02/14/2024 which confirmed adenocarcinoma of the lung.  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  Started on osimertinib  80 mg daily from 03/02/2024.  Adenocarcinoma of right lung, stage 4 (HCC) CT scan reveals a mass in the right upper lobe with possible lung collapse due to airway obstruction. 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  On 02/14/2024, he underwent navigational bronchoscopy with biopsy of right upper lobe lung mass.  Pathology confirmed non-small cell carcinoma, consistent with adenocarcinoma.  Immunostains positive for TTF-1, Napsin and negative for p40.  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  Low TMB.  Microsatellite stable disease.  On 02/28/2024, staging PET scan showed masslike consolidation and volume loss in the right upper lobe with associated central hypermetabolic activity, consistent with biopsy-proven non-small cell carcinoma.   Mildly hypermetabolic mediastinal and right hilar lymph nodes consistent with nodal metastasis.  Multiple hypermetabolic sclerotic osseous lesions in the sternum, right scapula, sacral area.  No hypermetabolic activity within the liver.  Moderate right sided pleural effusion without hypermetabolic activity.  Patient cannot do MRI because of metallic hardware. CT head with and without contrast on 02/28/2024 showed no intracranial metastatic disease.  Previously I discussed staging, prognosis, plan of care, treatment options.  Reviewed NCCN guidelines.  Given EGFR mutation positivity, plan made to proceed with osimertinib  80 mg orally daily.  He started this from 03/02/2024.  Tolerating this fairly well except for some diarrhea and nausea.  Given bone metastatic disease, he was also started on Xgeva  to prevent pathological fractures from 03/21/2024.   Given hypocalcemia, will hold Xgeva  today.  We did get dental clearance for Xgeva .  He was advised to continue calcium  dosing at 600 mg orally twice daily.  Given stage IV disease, all treatment options are palliative in nature and not curative in intent.  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  We are hoping that his clinical symptoms improve with continued treatment.  RTC in 6 weeks for labs, office visit and possible Xgeva  injection.  Will obtain restaging PET scan prior to that appointment.    Cough and dyspnea related to lung cancer Cough exacerbated by bending over. Using albuterol  and spirometer to improve breathing. Symptoms expected to improve as treatment progresses. - Continue using albuterol  inhaler and spirometer regularly to improve breathing and reduce cough.  Diarrhea secondary to cancer therapy and prior gastric surgery Diarrhea is more frequent and watery, likely exacerbated by Tagrisso  and history of gastric surgery. Tagrisso  can cause diarrhea in about one-third  of patients. - Instruct to take two Imodium tablets at  first loose bowel movement, then one tablet after each subsequent loose bowel movement, not exceeding eight tablets per day.  Mild hypocalcemia Hypocalcemia secondary to bone-targeted therapy Hypocalcemia with calcium  level at 8.7, better compared to prior. Risk of cardiac issues if calcium  levels drop too low. Injection skipped today due to low calcium  levels. Dental clearance has been obtained to proceed with Xgeva  in the future. -Continue increased dose of calcium  at 600 mg twice daily, morning and night. - Hold Xgeva  injection until calcium  levels improve and dental clearance is obtained.  Thrombocytopenia, chronic Platelet count at 117,000, consistent with previous fluctuations since 2008. No new interventions required at this time. - Continue monitoring platelet count.  Shoulder pain, likely musculoskeletal Shoulder pain, etiology unclear, possibly related to recent fall or use of the arm.   Right-sided malignant pleural effusion Pleural effusion on the right side associated with lung cancer. Expected to improve with targeted therapy. If fluid persists, a thoracentesis may be considered. - Monitor pleural effusion and consider thoracentesis if fluid persists. - Encourage use of spirometer twice daily to help expand lungs.   I reviewed lab results and outside records for this visit and discussed relevant results with the patient. Diagnosis, plan of care and treatment options were also discussed in detail with the patient. Opportunity provided to ask questions and answers provided to his apparent satisfaction. Provided instructions to call our clinic with any problems, questions or concerns prior to return visit. I recommended to continue follow-up with PCP and sub-specialists. He verbalized understanding and agreed with the plan.   NCCN guidelines have been consulted in the planning of this patient's care.  I spent a total of 42 minutes during this encounter with the patient including  review of chart and various tests results, discussions about plan of care and coordination of care plan.   [name], MD  05/25/2024 4:49 PM  [facility] - A DEPT [facility] 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   Current Treatment: Started taking osimertinib  80 mg daily on 03/02/2024.  INTERVAL HISTORY:    Discussed the use of AI scribe software for clinical note transcription with the patient, who gave verbal consent to proceed.  History of Present Illness  Jacob Dalton is an 78 year old male with lung cancer who presents for follow-up regarding his ongoing treatment.  He experiences diarrhea, which he manages with Imodium, taking it every morning to control symptoms. The diarrhea impacts his daily activities, necessitating caution with his diet.  He has a persistent cough that occurs when leaning forward, which has been consistent without recent changes.  He is currently taking Tagrisso  for lung cancer and has increased his calcium  intake to twice daily. He also takes a multivitamin to address low iron  levels due to partial loss of the small intestine, affecting absorption. He is concerned about his calcium  and magnesium levels, noting a recent calcium  level of 8.7, slightly below normal.  He mentions right shoulder pain, attributed to using a cane, which has slightly decreased over time.  He is sensitive to chemical smells, particularly in the bathroom.  He discusses his medication routine, noting occasional  delays in taking Tagrisso  but within a four-hour window. He is mindful of hydration, though he dislikes drinking large amounts of water.  His platelet count was 117,000 at  the most recent check. His hemoglobin is normal at 13.3.  His family history includes a relative who lived with a similar condition for an extended period, providing context for his treatment expectations.    I have reviewed the past medical history, past surgical history, social history and family history with the patient and they are unchanged from previous note.  HISTORY OF PRESENT ILLNESS:   ONCOLOGY HISTORY:   He has a history of back pain that began approximately in January 2025, leading to multiple diagnostic tests including a myelogram and various bone scans, none of which showed significant findings.    Later on he developed cough, shortness of breath and chest x-ray was abnormal which led to CT of the chest without contrast on 01/27/2024 which showed findings concerning for right upper lobe lung malignancy with possible liver metastatic disease and bone metastatic disease.   CT Chest without contrast on 01/27/2024: Large, masslike peribronchial consolidation in the right upper lobe, measuring 4.2 x 7.3 x 7.5 cm, especially worrisome for primary lung malignancy. Interlobular septal thickening and areas of micronodularity throughout the remainder of the right upper lobe is worrisome for lymphangitic spread of disease. Bony sclerosis in the lateral right sternomanubrial joint and scattered throughout the visualized thoracolumbar spine (the largest lesion in the left lateral T9 vertebral body), worrisome for metastatic disease. Heterogeneous appearance of the liver parenchyma with a couple of small hypodensities (measuring up to 1.3 cm in the left hepatic lobe). A nonemergent CT of the abdomen and pelvis with IV contrast is recommended to assess for metastatic disease.    CT Abdomen and Pelvis with and without contrast on 02/03/2024 showed hypodense ill-defined 14 mm segment IV b hepatic lesion demonstrating a questionable peripheral nodular focus of enhancement on arterial phase decreasing in  size peripherally over subsequent postcontrast sequences. Nonspecific imaging characteristics possibly reflecting a benign hepatic hemangioma but is incompletely characterized on this examination and metastasis while not definitively favored is not excluded. Consider more definitive characterization with hepatic protocol MRI with and without contrast. If the definitive diagnosis of these would NOT adjust immediate therapy options would suggest follow-up in 2-3 months by MRI to allow assessment of the additional variable of change over time to help in the evaluation of these lesions . Ill-defined subtle tiny hypodensities in the right and left lobes of the liver measuring 4 mm and 5 mm respectively are also incompletely characterized. Attention on follow-up MRI imaging suggested. 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 Symmetric distal esophageal wall thickening, which may reflect esophagitis. Enlarged prostate gland. Aortic atherosclerosis.   He had consultation in pulmonology clinic on 02/07/2024 and is scheduled for robotic assisted navigational bronchoscopy with biopsies on 02/14/2024.   His past medical history includes significant surgical history from an injury sustained during the [facility] war, resulting in the loss of part of his liver, 80% of his stomach, gallbladder, appendix, and vagus nerves. He also has a history of a drug-eluting stent placement in his LAD due to a 99% occlusion, for which he takes Plavix . He uses a CPAP machine for sleep apnea, although he has had difficulty with mask fit and compliance recently. He has low-level diabetes, previously managed with metformin, but currently not on medication. His A1c has been stable between 6 and  7.   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 Engineer, Building Services, he pursued higher education,  completing college and law school. No significant exposures post-military, aside from living in an old house and exposure to dust in his garage.   He lives with his wife , he is a retired Programmer, Multimedia and judge. He has adult children. Family history of allergies, asthma, heart disease rheumatism and cancer.He drinks one beer 3 times a week.    He does experience shortness of breath with activity. He has experienced weight loss, noting a decrease of about ten pounds over the last six months, with a recent drop in appetite. No hemoptysis or any significant respiratory symptoms aside from a cough that he attributes to his current situation. The cough is frequent and non productive.   On 02/14/2024, he underwent navigational bronchoscopy with biopsy of right upper lobe lung mass.  Pathology confirmed non-small cell carcinoma, consistent with adenocarcinoma.  Immunostains positive for TTF-1, Napsin and negative for p40.  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  Low TMB.  Microsatellite stable disease.  On 02/28/2024, staging PET scan showed masslike consolidation and volume loss in the right upper lobe with associated central hypermetabolic activity, consistent with biopsy-proven non-small cell carcinoma.  Mildly hypermetabolic mediastinal and right hilar lymph nodes consistent with nodal metastasis.  Multiple hypermetabolic sclerotic osseous lesions in the sternum, right scapula, sacral area.  No hypermetabolic activity within the liver.  Moderate right sided pleural effusion without hypermetabolic activity.  Patient cannot do MRI because of metallic hardware. CT head with and without contrast on 02/28/2024 showed no intracranial metastatic disease.  Given EGFR mutation positivity, plan made to proceed with osimertinib  80 mg orally daily.  Started this from 03/02/2024.  Given bone metastatic disease, he was also started on Xgeva  from 03/21/2024, to  prevent pathological fractures.  It is currently on hold because of hypocalcemia and pending dental clearance.  Oncology History  Adenocarcinoma of right lung, stage 4 (HCC)  02/07/2024 Initial Diagnosis   Adenocarcinoma of right lung, stage 4 (HCC)   02/29/2024 Cancer Staging   Staging form: Lung, AJCC V9 - Clinical: Stage IVB (cT4, cN2b, cM1c1) - Signed by Autumn Millman, MD on 02/29/2024 Method of lymph node assessment: Clinical       REVIEW OF SYSTEMS:   Review of Systems - Oncology  All other pertinent systems were reviewed with the patient and are negative.  ALLERGIES: He is allergic to codeine, fluvoxamine maleate, metformin hcl er, oxybutynin, simvastatin, sulfamethoxazole-trimethoprim, other, atorvastatin , gold, and nsaids.  MEDICATIONS:  Current Outpatient Medications  Medication Sig Dispense Refill   acetaminophen  (TYLENOL ) 500 MG tablet 1 tablet as needed Orally every 12 hours     albuterol  (VENTOLIN  HFA) 108 (90 Base) MCG/ACT inhaler Inhale 2 puffs into the lungs every 6 (six) hours as needed for wheezing or shortness of breath. 8 g 2   buPROPion (WELLBUTRIN XL) 300 MG 24 hr tablet Take 300 mg by mouth daily.     calcipotriene-betamethasone  (TACLONEX SCALP) external suspension Apply 1 application topically daily as needed (SCALP PSORASIS).      clobetasol (TEMOVATE) 0.05 % external solution Apply topically.     clopidogrel  (PLAVIX ) 75 MG tablet TAKE 1 TABLET(75 MG) BY MOUTH DAILY 90 tablet 3   Cyanocobalamin  (VITAMIN B12) 1000 MCG TBCR Take 1,000 mcg by mouth daily.  fluoruracil (CARAC) 0.5 % cream Apply 1 application topically daily as needed (Burn of pre-cancer cells).      fluticasone (FLONASE) 50 MCG/ACT nasal spray Place 1 spray into both nostrils daily as needed for allergies.     glucose blood (FREESTYLE LITE) test strip as directed In Vitro once a day dx:E11.9; Duration: 30 days     guaiFENesin-dextromethorphan (ROBITUSSIN DM) 100-10 MG/5ML syrup Take 5 mLs  by mouth every 4 (four) hours as needed for cough.     Multiple Vitamins-Iron  (MULTIVITAMIN/IRON  PO) Take 1 tablet by mouth daily.     nitroGLYCERIN  (NITROSTAT ) 0.4 MG SL tablet Place 1 tablet (0.4 mg total) under the tongue every 5 (five) minutes as needed for chest pain. 25 tablet 3   ondansetron  (ZOFRAN -ODT) 8 MG disintegrating tablet Take 1 tablet (8 mg total) by mouth every 8 (eight) hours as needed for nausea or vomiting. 30 tablet 3   osimertinib  mesylate (TAGRISSO ) 80 MG tablet Take 1 tablet (80 mg total) by mouth daily. 30 tablet 5   PROBIOTIC, LACTOBACILLUS, PO Take by mouth.     rosuvastatin (CRESTOR) 5 MG tablet Take 5 mg by mouth at bedtime.      simethicone (MYLICON) 80 MG chewable tablet Chew 80-240 mg by mouth every 6 (six) hours as needed for flatulence.      sodium fluoride-calcium  carbonate (FLORICAL) 8.3-364 MG CAPS capsule Take 1 capsule by mouth 2 (two) times daily.     tadalafil (CIALIS) 5 MG tablet Take 5 mg by mouth daily as needed for erectile dysfunction.     Vibegron (GEMTESA) 75 MG TABS Take 75 mg by mouth daily.     Vitamin D-Vitamin K (VITAMIN K2-VITAMIN D3 PO) Take 1 tablet by mouth daily.     No current facility-administered medications for this visit.     VITALS:   Blood pressure (!) 124/59, pulse 80, temperature (!) 97.5 F (36.4 C), resp. rate 17, weight 151 lb 12.8 oz (68.9 kg), SpO2 99%.  Wt Readings from Last 3 Encounters:  05/25/24 151 lb 12.8 oz (68.9 kg)  04/19/24 151 lb 9.6 oz (68.8 kg)  03/21/24 148 lb (67.1 kg)    Body mass index is 22.1 kg/m.   Onc Performance Status - 05/25/24 1441       ECOG Perf Status   ECOG Perf Status Restricted in physically strenuous activity but ambulatory and able to carry out work of a light or sedentary nature, e.g., light house work, office work      KPS SCALE   KPS % SCORE Normal activity with effort, some s/s of disease           PHYSICAL EXAM:   Physical Exam Constitutional:      General: He  is not in acute distress.    Appearance: Normal appearance.  HENT:     Head: Normocephalic and atraumatic.  Eyes:     Conjunctiva/sclera: Conjunctivae normal.  Cardiovascular:     Rate and Rhythm: Normal rate and regular rhythm.  Pulmonary:     Effort: Pulmonary effort is normal. No respiratory distress.     Comments: Diminished breath sounds at the right base.  Clear to auscultation elsewhere. Abdominal:     General: There is no distension.  Neurological:     General: No focal deficit present.     Mental Status: He is alert and oriented to person, place, and time.  Psychiatric:        Mood and Affect: Mood normal.  Behavior: Behavior normal.       LABORATORY DATA:   I have reviewed the data as listed.  Results for orders placed or performed in visit on 05/25/24  Iron  and Iron  Binding Capacity ([facility] only)  Result Value Ref Range   Iron  45 45 - 182 ug/dL   TIBC 615 749 - 549 ug/dL   Saturation Ratios 12 (L) 17.9 - 39.5 %   UIBC 339 ug/dL  Magnesium  Result Value Ref Range   Magnesium 2.7 (H) 1.7 - 2.4 mg/dL  CMP (Cancer Center only)  Result Value Ref Range   Sodium 140 135 - 145 mmol/L   Potassium 4.5 3.5 - 5.1 mmol/L   Chloride 104 98 - 111 mmol/L   CO2 28 22 - 32 mmol/L   Glucose, Bld 87 70 - 99 mg/dL   BUN 22 8 - 23 mg/dL   Creatinine 9.15 9.38 - 1.24 mg/dL   Calcium  8.7 (L) 8.9 - 10.3 mg/dL   Total Protein 7.2 6.5 - 8.1 g/dL   Albumin 4.4 3.5 - 5.0 g/dL   AST 43 (H) 15 - 41 U/L   ALT 45 (H) 0 - 44 U/L   Alkaline Phosphatase 128 (H) 38 - 126 U/L   Total Bilirubin 0.4 0.0 - 1.2 mg/dL   GFR, Estimated >39 >39 mL/min   Anion gap 8 5 - 15  CBC with Differential (Cancer Center Only)  Result Value Ref Range   WBC Count 5.1 4.0 - 10.5 K/uL   RBC 4.97 4.22 - 5.81 MIL/uL   Hemoglobin 13.3 13.0 - 17.0 g/dL   HCT 58.4 60.9 - 47.9 %   MCV 83.5 80.0 - 100.0 fL   MCH 26.8 26.0 - 34.0 pg   MCHC 32.0 30.0 - 36.0 g/dL   RDW 85.3 88.4 - 84.4 %   Platelet  Count 117 (L) 150 - 400 K/uL   nRBC 0.0 0.0 - 0.2 %   Neutrophils Relative % 70 %   Neutro Abs 3.6 1.7 - 7.7 K/uL   Lymphocytes Relative 16 %   Lymphs Abs 0.8 0.7 - 4.0 K/uL   Monocytes Relative 10 %   Monocytes Absolute 0.5 0.1 - 1.0 K/uL   Eosinophils Relative 3 %   Eosinophils Absolute 0.2 0.0 - 0.5 K/uL   Basophils Relative 1 %   Basophils Absolute 0.0 0.0 - 0.1 K/uL   Immature Granulocytes 0 %   Abs Immature Granulocytes 0.02 0.00 - 0.07 K/uL       RADIOGRAPHIC STUDIES:  I have personally reviewed the radiological images as listed and agree with the findings in the report.  CT HEAD W & WO CONTRAST ( ) CLINICAL DATA:  Initial evaluation for possible intracranial metastatic disease. Intermittent headaches.  EXAM: CT HEAD WITHOUT AND WITH CONTRAST  TECHNIQUE: Contiguous axial images were obtained from the base of the skull through the vertex without and with intravenous contrast.  RADIATION DOSE REDUCTION: This exam was performed according to the departmental dose-optimization program which includes automated exposure control, adjustment of the mA and/or kV according to patient size and/or use of iterative reconstruction technique.  CONTRAST:  75mL OMNIPAQUE  IOHEXOL  300 MG/ML  SOLN  COMPARISON:  PET-CT from earlier the same day as well as prior head CT from 03/05/2020.  FINDINGS: Brain: Mild age-related cerebral atrophy. Patchy hypodensity involving the periventricular deep white matter both cerebral hemispheres, consistent with chronic small vessel ischemic disease, mild-to-moderate in nature. No acute intracranial hemorrhage. No acute large vessel territory infarct. No  visible mass lesion or evidence for intracranial metastatic disease. No abnormal enhancement. Diffuse ventricular prominence related global parenchymal volume loss without hydrocephalus. No extra-axial fluid collection.  Vascular: No abnormal hyperdense vessel seen prior to  contrast administration. Following contrast administration, normal enhancement seen throughout the major intracranial vascular structures. Scattered vascular calcifications noted within the carotid siphons.  Skull: Scalp soft tissues demonstrate no acute finding. Calvarium intact. No visible focal osseous lesions.  Sinuses/Orbits: Globes and orbital soft tissues within normal limits. Mild scattered mucosal thickening present about the ethmoidal air cells. Paranasal sinuses are otherwise clear. No mastoid effusion.  Other: None.  IMPRESSION: 1. No acute intracranial abnormality. No evidence for intracranial metastatic disease. 2. Mild age-related cerebral atrophy with mild-to-moderate chronic small vessel ischemic disease.  Electronically Signed   By: Morene Hoard M.D.   On: 02/28/2024 20:33 NM PET Image Initial (PI) Skull Base To Thigh CLINICAL DATA:  Initial treatment strategy for suspected lung cancer with hepatic and osseous metastatic disease.  EXAM: NUCLEAR MEDICINE PET SKULL BASE TO THIGH  TECHNIQUE: 7.33 mCi F-18 FDG was injected intravenously. Full-ring PET imaging was performed from the skull base to thigh after the radiotracer. CT data was obtained and used for attenuation correction and anatomic localization.  Fasting blood glucose: 126 mg/dl  COMPARISON:  Chest CT 02/16/2024 and 01/27/2024. Abdominopelvic CT 02/03/2024.  FINDINGS: Mediastinal blood pool activity: SUV max 1.8  NECK:  No hypermetabolic cervical lymph nodes are identified. No suspicious activity identified within the pharyngeal mucosal space.  Incidental CT findings: Intracranial atrophy noted.  CHEST:  Persistent mass-like consolidation and volume loss in the right upper lobe with associated central hypermetabolic activity (SUV max 6.1). Persistent associated right upper lobe bronchial narrowing. No hypermetabolic activity or suspicious nodularity elsewhere within the  lungs. There are several small mildly hypermetabolic mediastinal and right hilar lymph nodes, including a subcarinal node with an SUV max of 4.0 and a right hilar node with an SUV max of 3.6. No hypermetabolic axillary adenopathy.  Incidental CT findings: Enlarging moderate-sized right pleural effusion without associated hypermetabolic activity. Mild centrilobular and paraseptal emphysema. Coronary atherosclerosis noted.  ABDOMEN/PELVIS:  No hypermetabolic activity identified within the liver to correspond with the previously demonstrated lesions. There is no hypermetabolic activity within the spleen, pancreas or adrenal glands. There is no hypermetabolic nodal activity in the abdomen or pelvis. Bowel activity within physiologic limits.  Incidental CT findings: Surgical clips at the gastroesophageal junction. Aortic and branch vessel atherosclerosis. The prostate gland is mildly enlarged.  SKELETON:  Again demonstrated are multiple sclerotic osseous lesions with associated hypermetabolic activity, consistent with metastatic disease. Representative lesions include a lesion in the upper left sternum with an SUV max of 4.5, a T9 lesion with an SUV max of 6.0, right scapular lesion (SUV max 4.8) and a central sacral lesion (SUV max 5.2). No definite pathologic fracture identified.  Incidental CT findings: Multilevel spondylosis associated with a convex left lumbar scoliosis.  IMPRESSION: 1. Persistent mass-like consolidation and volume loss in the right upper lobe with associated central hypermetabolic activity, consistent with biopsy proven non-small cell carcinoma. 2. Mildly hypermetabolic mediastinal and right hilar lymph nodes, suspicious for nodal metastases. 3. Multiple hypermetabolic sclerotic osseous lesions consistent with metastatic disease. 4. No hypermetabolic activity identified within the liver to correspond with the previously demonstrated lesions. 5. Enlarging  moderate-sized right pleural effusion without associated hypermetabolic activity. 6. Aortic Atherosclerosis (ICD10-I70.0) and Emphysema (ICD10-J43.9).  Electronically Signed   By: Elsie Perone M.D.   On:  02/28/2024 17:15      CODE STATUS:  Advance Directive Documentation    Flowsheet Row Most Recent Value  Type of Advance Directive Out of facility DNR (pink MOST or yellow form)  Pre-existing out of facility DNR order (yellow form or pink MOST form) --  MOST Form in Place? --    Orders Placed This Encounter  Procedures   NM PET Image Restag (PS) Skull Base To Thigh    Standing Status:   Future    Expected Date:   06/25/2024    Expiration Date:   05/25/2025    If indicated for the ordered procedure, I authorize the administration of a radiopharmaceutical per Radiology protocol:   Yes    Preferred imaging location?:   [place]   CBC with Differential (Cancer Center Only)    Standing Status:   Future    Expected Date:   07/06/2024    Expiration Date:   10/04/2024   CMP (Cancer Center only)    Standing Status:   Future    Expected Date:   07/06/2024    Expiration Date:   10/04/2024   Magnesium    Standing Status:   Future    Expected Date:   07/06/2024    Expiration Date:   10/04/2024     Future Appointments  Date Time Provider Department Center  07/05/2024 11:00 AM [facility] None  07/05/2024 11:30 AM Jacob Atteberry, MD [facility] None  07/05/2024 12:00 PM [facility] FLUSH [facility] None     This document was completed utilizing speech recognition software. Grammatical errors, random word insertions, pronoun errors, and incomplete sentences are an occasional consequence of this system due to software limitations, ambient noise, and hardware issues. Any formal questions or concerns about the content, text or information contained within the body of this dictation should be directly addressed to the provider for clarification.

## 2024-05-29 DIAGNOSIS — M545 Low back pain, unspecified: Secondary | ICD-10-CM | POA: Diagnosis not present

## 2024-06-25 ENCOUNTER — Encounter (HOSPITAL_COMMUNITY)
Admission: RE | Admit: 2024-06-25 | Discharge: 2024-06-25 | Disposition: A | Discharge status: Home / Self Care | Source: Ambulatory Visit | Attending: Oncology | Admitting: Oncology

## 2024-06-25 DIAGNOSIS — J90 Pleural effusion, not elsewhere classified: Secondary | ICD-10-CM | POA: Insufficient documentation

## 2024-06-25 DIAGNOSIS — R590 Localized enlarged lymph nodes: Secondary | ICD-10-CM | POA: Insufficient documentation

## 2024-06-25 DIAGNOSIS — C3411 Malignant neoplasm of upper lobe, right bronchus or lung: Secondary | ICD-10-CM | POA: Insufficient documentation

## 2024-06-25 DIAGNOSIS — C7951 Secondary malignant neoplasm of bone: Secondary | ICD-10-CM | POA: Diagnosis not present

## 2024-06-25 DIAGNOSIS — C3491 Malignant neoplasm of unspecified part of right bronchus or lung: Secondary | ICD-10-CM | POA: Diagnosis present

## 2024-06-25 LAB — GLUCOSE, CAPILLARY: Glucose-Capillary: 149 mg/dL — ABNORMAL HIGH (ref 70–99)

## 2024-06-25 MED ORDER — FLUDEOXYGLUCOSE F - 18 (FDG) INJECTION
7.5000 | Freq: Once | INTRAVENOUS | Status: AC
  Administered 2024-06-25: 7.51 via INTRAVENOUS

## 2024-07-05 ENCOUNTER — Encounter: Payer: Self-pay | Admitting: Oncology

## 2024-07-05 ENCOUNTER — Inpatient Hospital Stay

## 2024-07-05 ENCOUNTER — Inpatient Hospital Stay: Admitting: Oncology

## 2024-07-05 ENCOUNTER — Inpatient Hospital Stay: Attending: Oncology

## 2024-07-05 DIAGNOSIS — D696 Thrombocytopenia, unspecified: Secondary | ICD-10-CM | POA: Insufficient documentation

## 2024-07-05 DIAGNOSIS — C3491 Malignant neoplasm of unspecified part of right bronchus or lung: Secondary | ICD-10-CM

## 2024-07-05 LAB — CBC WITH DIFFERENTIAL (CANCER CENTER ONLY)
Abs Immature Granulocytes: 0.02 K/uL (ref 0.00–0.07)
Basophils Absolute: 0 K/uL (ref 0.0–0.1)
Basophils Relative: 0 %
Eosinophils Absolute: 0.1 K/uL (ref 0.0–0.5)
Eosinophils Relative: 3 %
HCT: 39 % (ref 39.0–52.0)
Hemoglobin: 12.6 g/dL — ABNORMAL LOW (ref 13.0–17.0)
Immature Granulocytes: 0 %
Lymphocytes Relative: 15 %
Lymphs Abs: 0.7 K/uL (ref 0.7–4.0)
MCH: 26.9 pg (ref 26.0–34.0)
MCHC: 32.3 g/dL (ref 30.0–36.0)
MCV: 83.3 fL (ref 80.0–100.0)
Monocytes Absolute: 0.5 K/uL (ref 0.1–1.0)
Monocytes Relative: 11 %
Neutro Abs: 3.3 K/uL (ref 1.7–7.7)
Neutrophils Relative %: 71 %
Platelet Count: 115 K/uL — ABNORMAL LOW (ref 150–400)
RBC: 4.68 MIL/uL (ref 4.22–5.81)
RDW: 14.6 % (ref 11.5–15.5)
WBC Count: 4.7 K/uL (ref 4.0–10.5)
nRBC: 0 % (ref 0.0–0.2)

## 2024-07-05 LAB — CMP (CANCER CENTER ONLY)
ALT: 55 U/L — ABNORMAL HIGH (ref 0–44)
AST: 52 U/L — ABNORMAL HIGH (ref 15–41)
Albumin: 4.3 g/dL (ref 3.5–5.0)
Alkaline Phosphatase: 110 U/L (ref 38–126)
Anion gap: 12 (ref 5–15)
BUN: 24 mg/dL — ABNORMAL HIGH (ref 8–23)
CO2: 25 mmol/L (ref 22–32)
Calcium: 8.3 mg/dL — ABNORMAL LOW (ref 8.9–10.3)
Chloride: 103 mmol/L (ref 98–111)
Creatinine: 1.05 mg/dL (ref 0.61–1.24)
GFR, Estimated: >60 mL/min
Glucose, Bld: 135 mg/dL — ABNORMAL HIGH (ref 70–99)
Potassium: 4.3 mmol/L (ref 3.5–5.1)
Sodium: 140 mmol/L (ref 135–145)
Total Bilirubin: 0.4 mg/dL (ref 0.0–1.2)
Total Protein: 7 g/dL (ref 6.5–8.1)

## 2024-07-05 LAB — MAGNESIUM: Magnesium: 2.6 mg/dL — ABNORMAL HIGH (ref 1.7–2.4)

## 2024-07-05 NOTE — Assessment & Plan Note (Addendum)
 Platelet count at 115,000, consistent with previous fluctuations since 2008. No new interventions required at this time. - Continue monitoring platelet count.

## 2024-07-05 NOTE — Assessment & Plan Note (Addendum)
 Hypocalcemia with calcium  level at 8.3, lower compared to prior. Risk of cardiac issues if calcium  levels drop too low.  Xgeva  injection skipped today due to low calcium  levels. Dental clearance has been obtained to proceed with Xgeva  in the future. -Continue increased dose of calcium  at 600 mg twice daily, morning and night. - Hold Xgeva  injection until calcium  levels improve.

## 2024-07-05 NOTE — Assessment & Plan Note (Addendum)
 CT scan reveals a mass in the right upper lobe with possible lung collapse due to airway obstruction. 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  On 02/14/2024, he underwent navigational bronchoscopy with biopsy of right upper lobe lung mass.  Pathology confirmed non-small cell carcinoma, consistent with adenocarcinoma.  Immunostains positive for TTF-1, Napsin and negative for p40.  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  Low TMB.  Microsatellite stable disease.  On 02/28/2024, staging PET scan showed masslike consolidation and volume loss in the right upper lobe with associated central hypermetabolic activity, consistent with biopsy-proven non-small cell carcinoma.  Mildly hypermetabolic mediastinal and right hilar lymph nodes consistent with nodal metastasis.  Multiple hypermetabolic sclerotic osseous lesions in the sternum, right scapula, sacral area.  No hypermetabolic activity within the liver.  Moderate right sided pleural effusion without hypermetabolic activity.  Patient cannot do MRI because of metallic hardware. CT head with and without contrast on 02/28/2024 showed no intracranial metastatic disease.  Previously I discussed staging, prognosis, plan of care, treatment options.  Reviewed NCCN guidelines.  Given EGFR mutation positivity, plan made to proceed with osimertinib  80 mg orally daily.  He started this from 03/02/2024.  Tolerating this fairly well except for some diarrhea and nausea.  Restaging PET scan on 06/25/2024 showed excellent response with interval near complete resolution of the right upper lobe hypermetabolic mass, with only a band of atelectasis remaining and no residual abnormal metabolic activity at the primary tumor site. Resolution of hypermetabolic mediastinal lymphadenopathy, with a single small persistent hypermetabolic right hilar lymph node. Moderate right pleural  effusion. Overall interval metabolic improvement in known osseous metastatic disease, with residual very low-level activity in sclerotic lesions.  Plan is to continue current management with osimertinib .  Given bone metastatic disease, he was also started on Xgeva  to prevent pathological fractures from 03/21/2024.   Given hypocalcemia, will hold Xgeva  today.  We did get dental clearance for Xgeva .  He was advised to continue calcium  dosing at 600 mg orally twice daily.  Given stage IV disease, all treatment options are palliative in nature and not curative in intent.  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  We are hoping that his clinical symptoms improve with continued treatment.  RTC in 8 weeks for labs, office visit and possible Xgeva  injection.

## 2024-07-05 NOTE — Progress Notes (Signed)
 "  [facility]  ONCOLOGY CLINIC PROGRESS NOTE   Patient Care Team: Charlott Dorn LABOR, MD as PCP - General (Internal Medicine) Anner Alm ORN, MD as PCP - Cardiology (Cardiology) Shelah Lamar RAMAN, MD as Consulting Physician (Pulmonary Disease) Ruthell Lauraine FALCON, NP as Nurse Practitioner (Pulmonary Disease) Prentis Duwaine BROCKS, RN as Oncology Nurse Navigator  PATIENT NAME: Jacob Dalton   MR#: [id number] DOB: 20-Jan-1946  Date of visit: 07/05/2024   ASSESSMENT & PLAN:   GREGOIRE BENNIS is a 79 y.o. gentleman with a past medical history of CAD status post PCI, hypertension, diabetes, peripheral neuropathy, GERD, past history of smoking (pipe), status post partial gastrectomy from trauma wound, vitamin B12 deficiency, chronic thrombocytopenia, was referred to our clinic for right upper lobe lung mass that was suspicious for lung malignancy, with possible liver and bone mets.  He underwent biopsy of the right upper lobe lung mass on 02/14/2024 which confirmed adenocarcinoma of the lung.  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  Started on osimertinib  80 mg daily from 03/02/2024.  Adenocarcinoma of right lung, stage 4 (HCC) CT scan reveals a mass in the right upper lobe with possible lung collapse due to airway obstruction. 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  On 02/14/2024, he underwent navigational bronchoscopy with biopsy of right upper lobe lung mass.  Pathology confirmed non-small cell carcinoma, consistent with adenocarcinoma.  Immunostains positive for TTF-1, Napsin and negative for p40.  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  Low TMB.  Microsatellite stable disease.  On 02/28/2024, staging PET scan showed masslike consolidation and volume loss in the right upper lobe with associated central hypermetabolic activity, consistent with biopsy-proven non-small cell carcinoma.   Mildly hypermetabolic mediastinal and right hilar lymph nodes consistent with nodal metastasis.  Multiple hypermetabolic sclerotic osseous lesions in the sternum, right scapula, sacral area.  No hypermetabolic activity within the liver.  Moderate right sided pleural effusion without hypermetabolic activity.  Patient cannot do MRI because of metallic hardware. CT head with and without contrast on 02/28/2024 showed no intracranial metastatic disease.  Previously I discussed staging, prognosis, plan of care, treatment options.  Reviewed NCCN guidelines.  Given EGFR mutation positivity, plan made to proceed with osimertinib  80 mg orally daily.  He started this from 03/02/2024.  Tolerating this fairly well except for some diarrhea and nausea.  Restaging PET scan on 06/25/2024 showed excellent response with interval near complete resolution of the right upper lobe hypermetabolic mass, with only a band of atelectasis remaining and no residual abnormal metabolic activity at the primary tumor site. Resolution of hypermetabolic mediastinal lymphadenopathy, with a single small persistent hypermetabolic right hilar lymph node. Moderate right pleural effusion. Overall interval metabolic improvement in known osseous metastatic disease, with residual very low-level activity in sclerotic lesions.  Plan is to continue current management with osimertinib .  Given bone metastatic disease, he was also started on Xgeva  to prevent pathological fractures from 03/21/2024.   Given hypocalcemia, will hold Xgeva  today.  We did get dental clearance for Xgeva .  He was advised to continue calcium  dosing at 600 mg orally twice daily.  Given stage IV disease, all treatment options are palliative in nature and not curative in intent.  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  We are hoping that his clinical symptoms improve with continued treatment.  RTC in 8 weeks for labs, office visit and possible Xgeva  injection.  Thrombocytopenia Platelet count at 115,000, consistent with previous fluctuations since 2008. No new interventions required at this time. - Continue monitoring platelet count.  Hypocalcemia Hypocalcemia with calcium  level at 8.3, lower compared to prior. Risk of cardiac issues if calcium  levels drop too low.  Xgeva  injection skipped today due to low calcium  levels. Dental clearance has been obtained to proceed with Xgeva  in the future. -Continue increased dose of calcium  at 600 mg twice daily, morning and night. - Hold Xgeva  injection until calcium  levels improve.  Large colonic polyp He has a large, flat colonic polyp identified over a year ago, not amenable to standard endoscopic removal. Procedure was deferred due to back injury and subsequently due to lung cancer diagnosis and treatment. PET scan shows no evidence of active colonic disease. Given current oncologic status and risks of intervention while on Tagrisso , removal is deferred. - Deferred specialized colonoscopic removal at this time. - Will reassess need for intervention at future visits.  Cough and dyspnea related to lung cancer Symptoms are improved significantly compared to prior - Continue using albuterol  inhaler and spirometer regularly to improve breathing and reduce cough.  Shoulder pain, likely musculoskeletal Shoulder pain, etiology unclear, possibly related to previous fall or use of the arm.   Right-sided malignant pleural effusion Pleural effusion on the right side associated with lung cancer. Expected to improve with targeted therapy. If fluid persists, a thoracentesis may be considered. - Monitor pleural effusion and consider thoracentesis if fluid persists. - Encourage use of spirometer twice daily to help expand lungs.  I reviewed lab results and outside records for this visit and discussed relevant results with the patient. Diagnosis, plan of care and treatment options were also discussed in detail with the  patient. Opportunity provided to ask questions and answers provided to his apparent satisfaction. Provided instructions to call our clinic with any problems, questions or concerns prior to return visit. I recommended to continue follow-up with PCP and sub-specialists. He verbalized understanding and agreed with the plan.   NCCN guidelines have been consulted in the planning of this patients care.  I spent a total of 42 minutes during this encounter with the patient including review of chart and various tests results, discussions about plan of care and coordination of care plan.   [name], MD  07/05/2024 12:40 PM  [facility] - [facility] DE[facility] 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   Current Treatment: Started taking osimertinib  80 mg daily on 03/02/2024.  INTERVAL HISTORY:    Discussed the use of AI scribe software for clinical note transcription with the patient, who gave verbal consent to proceed.  History of Present Illness  Ulis Kaps is an 79 year old male with metastatic right lung adenocarcinoma who presents for oncology follow-up and review of PET scan.  He has metastatic adenocarcinoma of the right upper lobe with bone (ribs, sternum, spine, right scapula, pelvis) and pleural involvement. He is receiving osimertinib  (Tagrisso ) and has completed three months of therapy since the last PET scan, which previously showed significant tumor burden. The most recent PET scan demonstrates marked improvement: the right upper lobe mass is now resolved except for a band of scar tissue, and there is decreased  activity in previously involved bony sites. No new sites of disease are  identified. Pleural fluid persists but is improved compared to prior imaging.  He has persistent hypocalcemia, with calcium  levels remaining borderline low (most recently 8.3 mg/dL) despite supplementation with 600 mg calcium  twice daily and a multivitamin. Platelet counts remain stable but chronically low (range 110-117 x10^3/L), consistent with longstanding thrombocytopenia. Iron  levels were borderline low on prior assessment; he does not take additional iron  supplements beyond his multivitamin.  He experiences daily epistaxis and nasal ulcerations, which he attributes to winter dryness and CPAP use. He uses lotion for skin care and has saline products available for nasal care, though he has not yet started using them. No oral ulcerations are present. Nail changes have developed, including altered appearance and fragility; he uses topical new skin and is considering biotin supplementation. He finds his multivitamin difficult to swallow due to lack of coating.  He has chronic back pain and right leg numbness with ambulation, for which his orthopedist has recommended an injection between L2-3 and L3-4. The numbness is localized to the upper right leg. He also has chronic right shoulder discomfort, distinct from prior scapular bone involvement. He continues to use a spirometer for breathing exercises, achieving full inspiration with effort, though this sometimes induces cough.  A large, flat colonic polyp was identified over a year ago during colonoscopy and referred for specialized removal, but the procedure was postponed due to a back injury and subsequent lung cancer diagnosis. He denies hematochezia or melena.    I have reviewed the past medical history, past surgical history, social history and family history with the patient and they are unchanged from previous note.  HISTORY OF PRESENT ILLNESS:   ONCOLOGY HISTORY:   He has a history of back pain that began approximately in January 2025,  leading to multiple diagnostic tests including a myelogram and various bone scans, none of which showed significant findings.    Later on he developed cough, shortness of breath and chest x-ray was abnormal which led to CT of the chest without contrast on 01/27/2024 which showed findings concerning for right upper lobe lung malignancy with possible liver metastatic disease and bone metastatic disease.   CT Chest without contrast on 01/27/2024: Large, masslike peribronchial consolidation in the right upper lobe, measuring 4.2 x 7.3 x 7.5 cm, especially worrisome for primary lung malignancy. Interlobular septal thickening and areas of micronodularity throughout the remainder of the right upper lobe is worrisome for lymphangitic spread of disease. Bony sclerosis in the lateral right sternomanubrial joint and scattered throughout the visualized thoracolumbar spine (the largest lesion in the left lateral T9 vertebral body), worrisome for metastatic disease. Heterogeneous appearance of the liver parenchyma with a couple of small hypodensities (measuring up to 1.3 cm in the left hepatic lobe). A nonemergent CT of the abdomen and pelvis with IV contrast is recommended to assess for metastatic disease.    CT Abdomen and Pelvis with and without contrast on 02/03/2024 showed hypodense ill-defined 14 mm segment IV b hepatic lesion demonstrating a questionable peripheral nodular focus of enhancement on arterial phase decreasing in size peripherally over subsequent postcontrast sequences. Nonspecific imaging characteristics possibly reflecting a benign hepatic hemangioma but is incompletely characterized on this examination and metastasis while not definitively favored is not excluded. Consider more definitive characterization with hepatic protocol MRI with and without contrast. If the definitive diagnosis of these would NOT adjust immediate therapy options would suggest follow-up in 2-3 months by MRI to allow  assessment of the  additional variable of change over time to help in the evaluation of these lesions . Ill-defined subtle tiny hypodensities in the right and left lobes of the liver measuring 4 mm and 5 mm respectively are also incompletely characterized. Attention on follow-up MRI imaging suggested. 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 Symmetric distal esophageal wall thickening, which may reflect esophagitis. Enlarged prostate gland. Aortic atherosclerosis.   He had consultation in pulmonology clinic on 02/07/2024 and is scheduled for robotic assisted navigational bronchoscopy with biopsies on 02/14/2024.   His past medical history includes significant surgical history from an injury sustained during the [employer] [place] war, resulting in the loss of part of his liver, 80% of his stomach, gallbladder, appendix, and vagus nerves. He also has a history of a drug-eluting stent placement in his LAD due to a 99% occlusion, for which he takes Plavix . He uses a CPAP machine for sleep apnea, although he has had difficulty with mask fit and compliance recently. He has low-level diabetes, previously managed with metformin, but currently not on medication. His A1c has been stable between 6 and 7.   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 Engineer, Building Services, he pursued higher education, completing college and law school. No significant exposures post-military, aside from living in an old house and exposure to dust in his garage.   He lives with his wife , he is a retired Programmer, Multimedia and judge. He has adult children. Family history of allergies, asthma, heart disease rheumatism and cancer.He drinks one beer 3 times a week.    He does experience shortness of breath with activity. He has experienced weight loss, noting a decrease of about ten pounds over the last six months, with a  recent drop in appetite. No hemoptysis or any significant respiratory symptoms aside from a cough that he attributes to his current situation. The cough is frequent and non productive.   On 02/14/2024, he underwent navigational bronchoscopy with biopsy of right upper lobe lung mass.  Pathology confirmed non-small cell carcinoma, consistent with adenocarcinoma.  Immunostains positive for TTF-1, Napsin and negative for p40.  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  Low TMB.  Microsatellite stable disease.  On 02/28/2024, staging PET scan showed masslike consolidation and volume loss in the right upper lobe with associated central hypermetabolic activity, consistent with biopsy-proven non-small cell carcinoma.  Mildly hypermetabolic mediastinal and right hilar lymph nodes consistent with nodal metastasis.  Multiple hypermetabolic sclerotic osseous lesions in the sternum, right scapula, sacral area.  No hypermetabolic activity within the liver.  Moderate right sided pleural effusion without hypermetabolic activity.  Patient cannot do MRI because of metallic hardware. CT head with and without contrast on 02/28/2024 showed no intracranial metastatic disease.  Given EGFR mutation positivity, plan made to proceed with osimertinib  80 mg orally daily.  Started this from 03/02/2024.  Given bone metastatic disease, he was also started on Xgeva  from 03/21/2024, to prevent pathological fractures.  It is currently on hold because of hypocalcemia and pending dental clearance.  Restaging PET scan on 06/25/2024 showed excellent response with interval near complete resolution of the right upper lobe hypermetabolic mass, with only a band of atelectasis remaining and no residual abnormal metabolic activity at the primary tumor site. Resolution of hypermetabolic mediastinal lymphadenopathy, with a single small persistent hypermetabolic  right hilar lymph node. Moderate right  pleural effusion. Overall interval metabolic improvement in known osseous metastatic disease, with residual very low-level activity in sclerotic lesions.  Oncology History  Adenocarcinoma of right lung, stage 4 (HCC)  02/07/2024 Initial Diagnosis   Adenocarcinoma of right lung, stage 4 (HCC)   02/29/2024 Cancer Staging   Staging form: Lung, AJCC V9 - Clinical: Stage IVB (cT4, cN2b, cM1c1) - Signed by Autumn Millman, MD on 02/29/2024 Method of lymph node assessment: Clinical       REVIEW OF SYSTEMS:   Review of Systems - Oncology  All other pertinent systems were reviewed with the patient and are negative.  ALLERGIES: He is allergic to codeine, fluvoxamine maleate, metformin hcl er, oxybutynin, simvastatin, sulfamethoxazole-trimethoprim, other, atorvastatin , gold, and nsaids.  MEDICATIONS:  Current Outpatient Medications  Medication Sig Dispense Refill   acetaminophen  (TYLENOL ) 500 MG tablet 1 tablet as needed Orally every 12 hours     albuterol  (VENTOLIN  HFA) 108 (90 Base) MCG/ACT inhaler Inhale 2 puffs into the lungs every 6 (six) hours as needed for wheezing or shortness of breath. 8 g 2   buPROPion (WELLBUTRIN XL) 300 MG 24 hr tablet Take 300 mg by mouth daily.     calcipotriene-betamethasone  (TACLONEX SCALP) external suspension Apply 1 application topically daily as needed (SCALP PSORASIS).      clobetasol (TEMOVATE) 0.05 % external solution Apply topically.     clopidogrel  (PLAVIX ) 75 MG tablet TAKE 1 TABLET(75 MG) BY MOUTH DAILY 90 tablet 3   Cyanocobalamin  (VITAMIN B12) 1000 MCG TBCR Take 1,000 mcg by mouth daily.      fluoruracil (CARAC) 0.5 % cream Apply 1 application topically daily as needed (Burn of pre-cancer cells).      fluticasone (FLONASE) 50 MCG/ACT nasal spray Place 1 spray into both nostrils daily as needed for allergies.     glucose blood (FREESTYLE LITE) test strip as directed In Vitro once a day dx:E11.9; Duration: 30 days     guaiFENesin-dextromethorphan  (ROBITUSSIN DM) 100-10 MG/5ML syrup Take 5 mLs by mouth every 4 (four) hours as needed for cough.     Multiple Vitamins-Iron  (MULTIVITAMIN/IRON  PO) Take 1 tablet by mouth daily.     nitroGLYCERIN  (NITROSTAT ) 0.4 MG SL tablet Place 1 tablet (0.4 mg total) under the tongue every 5 (five) minutes as needed for chest pain. 25 tablet 3   osimertinib  mesylate (TAGRISSO ) 80 MG tablet Take 1 tablet (80 mg total) by mouth daily. 30 tablet 5   PROBIOTIC, LACTOBACILLUS, PO Take by mouth.     rosuvastatin (CRESTOR) 5 MG tablet Take 5 mg by mouth at bedtime.      simethicone (MYLICON) 80 MG chewable tablet Chew 80-240 mg by mouth every 6 (six) hours as needed for flatulence.      sodium fluoride-calcium  carbonate (FLORICAL) 8.3-364 MG CAPS capsule Take 1 capsule by mouth 2 (two) times daily.     tadalafil (CIALIS) 5 MG tablet Take 5 mg by mouth daily as needed for erectile dysfunction.     Vibegron (GEMTESA) 75 MG TABS Take 75 mg by mouth daily.     Vitamin D-Vitamin K (VITAMIN K2-VITAMIN D3 PO) Take 1 tablet by mouth daily.     ondansetron  (ZOFRAN -ODT) 8 MG disintegrating tablet Take 1 tablet (8 mg total) by mouth every 8 (eight) hours as needed for nausea or vomiting. (Patient not taking: Reported on 07/05/2024) 30 tablet 3   No current facility-administered medications for this visit.     VITALS:   There  were no vitals taken for this visit.  Wt Readings from Last 3 Encounters:  05/25/24 151 lb 12.8 oz (68.9 kg)  04/19/24 151 lb 9.6 oz (68.8 kg)  03/21/24 148 lb (67.1 kg)    There is no height or weight on file to calculate BMI.   Onc Performance Status - 07/05/24 1154       ECOG Perf Status   ECOG Perf Status Restricted in physically strenuous activity but ambulatory and able to carry out work of a light or sedentary nature, e.g., light house work, office work      KPS SCALE   KPS % SCORE Normal activity with effort, some s/s of disease          PHYSICAL EXAM:   Physical  Exam Constitutional:      General: He is not in acute distress.    Appearance: Normal appearance.  HENT:     Head: Normocephalic and atraumatic.  Eyes:     Conjunctiva/sclera: Conjunctivae normal.  Cardiovascular:     Rate and Rhythm: Normal rate and regular rhythm.  Pulmonary:     Effort: Pulmonary effort is normal. No respiratory distress.     Comments: Diminished breath sounds at the right base.  Clear to auscultation elsewhere. Abdominal:     General: There is no distension.  Neurological:     General: No focal deficit present.     Mental Status: He is alert and oriented to person, place, and time.  Psychiatric:        Mood and Affect: Mood normal.        Behavior: Behavior normal.      LABORATORY DATA:   I have reviewed the data as listed.  Results for orders placed or performed in visit on 07/05/24  Magnesium  Result Value Ref Range   Magnesium 2.6 (H) 1.7 - 2.4 mg/dL  CMP (Cancer Center only)  Result Value Ref Range   Sodium 140 135 - 145 mmol/L   Potassium 4.3 3.5 - 5.1 mmol/L   Chloride 103 98 - 111 mmol/L   CO2 25 22 - 32 mmol/L   Glucose, Bld 135 (H) 70 - 99 mg/dL   BUN 24 (H) 8 - 23 mg/dL   Creatinine 8.94 9.38 - 1.24 mg/dL   Calcium  8.3 (L) 8.9 - 10.3 mg/dL   Total Protein 7.0 6.5 - 8.1 g/dL   Albumin 4.3 3.5 - 5.0 g/dL   AST 52 (H) 15 - 41 U/L   ALT 55 (H) 0 - 44 U/L   Alkaline Phosphatase 110 38 - 126 U/L   Total Bilirubin 0.4 0.0 - 1.2 mg/dL   GFR, Estimated >39 >39 mL/min   Anion gap 12 5 - 15  CBC with Differential (Cancer Center Only)  Result Value Ref Range   WBC Count 4.7 4.0 - 10.5 K/uL   RBC 4.68 4.22 - 5.81 MIL/uL   Hemoglobin 12.6 (L) 13.0 - 17.0 g/dL   HCT 60.9 60.9 - 47.9 %   MCV 83.3 80.0 - 100.0 fL   MCH 26.9 26.0 - 34.0 pg   MCHC 32.3 30.0 - 36.0 g/dL   RDW 85.3 88.4 - 84.4 %   Platelet Count 115 (L) 150 - 400 K/uL   nRBC 0.0 0.0 - 0.2 %   Neutrophils Relative % 71 %   Neutro Abs 3.3 1.7 - 7.7 K/uL   Lymphocytes Relative 15  %   Lymphs Abs 0.7 0.7 - 4.0 K/uL   Monocytes Relative 11 %  Monocytes Absolute 0.5 0.1 - 1.0 K/uL   Eosinophils Relative 3 %   Eosinophils Absolute 0.1 0.0 - 0.5 K/uL   Basophils Relative 0 %   Basophils Absolute 0.0 0.0 - 0.1 K/uL   Immature Granulocytes 0 %   Abs Immature Granulocytes 0.02 0.00 - 0.07 K/uL     RADIOGRAPHIC STUDIES:  I have personally reviewed the radiological images as listed and agree with the findings in the report.  NM PET Image Restag (PS) Skull Base To Thigh EXAM: PET AND CT SKULL BASE TO MID THIGH 06/25/2024 01:34:10 PM  TECHNIQUE:  RADIOPHARMACEUTICAL: 7.51 mCi F-18 FDG Uptake time 60 minutes. Glucose level 149 mg/dl.  PET imaging was acquired from the base of the skull to the mid thighs. Non-contrast enhanced computed tomography was obtained for attenuation correction and anatomic localization.  COMPARISON: PET CT 02/28/2024.  CLINICAL HISTORY: Restaging stage 4 lung cancer, Adenocarcinoma of right lung, stage 4 BX x 3 months rt lung Cough x 3 months EOV.  FINDINGS:  HEAD AND NECK: No metabolically active cervical lymphadenopathy.  CHEST: Interval near complete resolution of the previously seen hypermetabolic mass. There is now just a band of atelectasis in the right upper lobe at the site of the prior 8 cm hypermetabolic mass. No abnormal metabolic activity remains at the tumor site. Additionally, there is interval resolution of the small hypermetabolic mediastinal lymph nodes seen on comparison exam. 1 small hypermetabolic node does remain in the right hilum with SUV max equal to 3.7 on image 78. Moderate right pleural effusion remains. No metabolically active pulmonary nodules.  ABDOMEN AND PELVIS: No metabolically active lymphadenopathy. Physiologic activity within the gastrointestinal and genitourinary systems.  BONES AND SOFT TISSUE: Interval resolution of previously documented hypermetabolic skeletal metastases. For  example, a lesion in the left sacral ala measuring SUV max of 3.2 compares to SUV max equal to 4.8 on prior. A lesion in the posterior right ilium with SUV max equal to 2.5 decreased from 5.0. A lesion in the central S1 sacrum with SUV max equal to 3 decreased from 5.2. Likewise, a lesion at T11 vertebral body with SUV max of 6.0 now completely resolved. Increased sclerosis in this region of the body as seen on imaging 89. 3 sclerotic lesions in the T12 vertebral body are more prominent than prior activity on imaging 98. A lesion in the vertebral body with SUV max equal to 3.2 decreased from SUV max of 5. Again, a sclerotic lesion at this site is a little more dense. No metabolically active aggressive osseous lesion.  IMPRESSION: 1. Interval near complete resolution of the right upper lobe hypermetabolic mass, with only a band of atelectasis remaining and no residual abnormal metabolic activity at the primary tumor site. 2. Resolution of hypermetabolic mediastinal lymphadenopathy, with a single small persistent hypermetabolic right hilar lymph node 3. Moderate right pleural effusion. 4. Overall interval metabolic improvement in known osseous metastatic disease, with residual very low-level activity in sclerotic lesions.  Electronically signed by: Norleen Boxer MD 07/03/2024 04:44 PM EST RP Workstation: [id number]   CODE STATUS:  Advance Directive Documentation    Flowsheet Row Most Recent Value  Type of Advance Directive Out of facility DNR (pink MOST or yellow form)  Pre-existing out of facility DNR order (yellow form or pink MOST form) --  MOST Form in Place? --    Orders Placed This Encounter  Procedures   CBC with Differential (Cancer Center Only)    Standing Status:   Standing  Number of Occurrences:   6    Expiration Date:   07/05/2025   CMP (Cancer Center only)    Standing Status:   Standing    Number of Occurrences:   6    Expiration Date:   07/05/2025   Magnesium     Standing Status:   Standing    Number of Occurrences:   6    Expiration Date:   07/05/2025     Future Appointments  Date Time Provider Department Center  09/05/2024 11:15 AM [facility] None  09/05/2024 11:30 AM Giorgia Wahler, [place], MD [facility] None  09/05/2024 12:00 PM [facility] None      This document was completed utilizing speech recognition software. Grammatical errors, random word insertions, pronoun errors, and incomplete sentences are an occasional consequence of this system due to software limitations, ambient noise, and hardware issues. Any formal questions or concerns about the content, text or information contained within the body of this dictation should be directly addressed to the provider for clarification.   "

## 2024-07-25 ENCOUNTER — Encounter: Payer: Self-pay | Admitting: Oncology

## 2024-09-05 ENCOUNTER — Inpatient Hospital Stay

## 2024-09-05 ENCOUNTER — Inpatient Hospital Stay: Admitting: Oncology

## 2024-09-05 ENCOUNTER — Inpatient Hospital Stay: Attending: Oncology
